# Patient Record
Sex: Male | Born: 1950 | Race: White | Hispanic: No | Smoking: Never smoker
Health system: Southern US, Community
[De-identification: ages and names within clinical notes are randomized; demographics above are authoritative.]

## PROBLEM LIST (undated history)

## (undated) DIAGNOSIS — C7800 Secondary malignant neoplasm of unspecified lung: Secondary | ICD-10-CM

## (undated) DIAGNOSIS — K4091 Unilateral inguinal hernia, without obstruction or gangrene, recurrent: Secondary | ICD-10-CM

## (undated) DIAGNOSIS — C099 Malignant neoplasm of tonsil, unspecified: Secondary | ICD-10-CM

## (undated) DIAGNOSIS — C3490 Malignant neoplasm of unspecified part of unspecified bronchus or lung: Secondary | ICD-10-CM

## (undated) DIAGNOSIS — Z8719 Personal history of other diseases of the digestive system: Secondary | ICD-10-CM

## (undated) DIAGNOSIS — J45909 Unspecified asthma, uncomplicated: Secondary | ICD-10-CM

## (undated) DIAGNOSIS — R918 Other nonspecific abnormal finding of lung field: Secondary | ICD-10-CM

## (undated) HISTORY — PX: CERVICAL SPINE SURGERY: SHX589

## (undated) HISTORY — PX: LUMBAR SPINE SURGERY: SHX701

## (undated) HISTORY — DX: Unspecified asthma, uncomplicated: J45.909

## (undated) HISTORY — DX: Secondary malignant neoplasm of unspecified lung: C78.00

## (undated) HISTORY — DX: Unilateral inguinal hernia, without obstruction or gangrene, recurrent: K40.91

---
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
## 2012-04-13 ENCOUNTER — Emergency Department: Payer: Self-pay | Admitting: Emergency Medicine

## 2012-05-08 ENCOUNTER — Ambulatory Visit: Payer: Self-pay | Admitting: Surgery

## 2012-05-08 DIAGNOSIS — Z01810 Encounter for preprocedural cardiovascular examination: Secondary | ICD-10-CM

## 2012-05-08 LAB — BASIC METABOLIC PANEL
Anion Gap: 7 (ref 7–16)
BUN: 14 mg/dL (ref 7–18)
Calcium, Total: 9.3 mg/dL (ref 8.5–10.1)
Chloride: 101 mmol/L (ref 98–107)
Co2: 30 mmol/L (ref 21–32)
Creatinine: 1.3 mg/dL (ref 0.60–1.30)
EGFR (African American): >60
EGFR (Non-African Amer.): 60 — ABNORMAL LOW
Glucose: 105 mg/dL — ABNORMAL HIGH (ref 65–99)
Osmolality: 277 (ref 275–301)
Potassium: 4.7 mmol/L (ref 3.5–5.1)
Sodium: 138 mmol/L (ref 136–145)

## 2012-05-08 LAB — CBC WITH DIFFERENTIAL/PLATELET
Basophil #: 0.1 10*3/uL (ref 0.0–0.1)
Basophil %: 0.8 %
Eosinophil #: 0.6 10*3/uL (ref 0.0–0.7)
Eosinophil %: 7.2 %
HCT: 47 % (ref 40.0–52.0)
HGB: 15.9 g/dL (ref 13.0–18.0)
Lymphocyte #: 1.5 10*3/uL (ref 1.0–3.6)
Lymphocyte %: 19 %
MCH: 31.1 pg (ref 26.0–34.0)
MCHC: 33.8 g/dL (ref 32.0–36.0)
MCV: 92 fL (ref 80–100)
Monocyte #: 0.6 x10 3/mm (ref 0.2–1.0)
Monocyte %: 7.4 %
Neutrophil #: 5.3 10*3/uL (ref 1.4–6.5)
Neutrophil %: 65.6 %
Platelet: 331 10*3/uL (ref 150–440)
RBC: 5.11 10*6/uL (ref 4.40–5.90)
RDW: 13.6 % (ref 11.5–14.5)
WBC: 8.1 10*3/uL (ref 3.8–10.6)

## 2012-05-20 ENCOUNTER — Ambulatory Visit: Payer: Self-pay | Admitting: Surgery

## 2013-01-19 ENCOUNTER — Emergency Department: Payer: Self-pay | Admitting: Emergency Medicine

## 2013-01-19 LAB — URINALYSIS, COMPLETE
Bilirubin,UR: NEGATIVE
Blood: NEGATIVE
Glucose,UR: NEGATIVE mg/dL (ref 0–75)
Hyaline Cast: 7
Ketone: NEGATIVE
Nitrite: NEGATIVE
Ph: 5 (ref 4.5–8.0)
Protein: NEGATIVE
RBC,UR: <1 /HPF (ref 0–5)
Specific Gravity: 1.02 (ref 1.003–1.030)
Squamous Epithelial: NONE SEEN
WBC UR: 11 /HPF (ref 0–5)

## 2013-01-19 LAB — CBC WITH DIFFERENTIAL/PLATELET
Basophil #: 0.1 10*3/uL (ref 0.0–0.1)
Basophil %: 0.7 %
Eosinophil #: 0.8 10*3/uL — ABNORMAL HIGH (ref 0.0–0.7)
Eosinophil %: 8.3 %
HCT: 44.6 % (ref 40.0–52.0)
HGB: 15.4 g/dL (ref 13.0–18.0)
Lymphocyte #: 1.7 10*3/uL (ref 1.0–3.6)
Lymphocyte %: 19 %
MCH: 30.8 pg (ref 26.0–34.0)
MCHC: 34.4 g/dL (ref 32.0–36.0)
MCV: 90 fL (ref 80–100)
Monocyte #: 0.7 x10 3/mm (ref 0.2–1.0)
Monocyte %: 8 %
Neutrophil #: 5.8 10*3/uL (ref 1.4–6.5)
Neutrophil %: 64 %
Platelet: 344 10*3/uL (ref 150–440)
RBC: 4.98 10*6/uL (ref 4.40–5.90)
RDW: 13.5 % (ref 11.5–14.5)
WBC: 9.1 10*3/uL (ref 3.8–10.6)

## 2013-01-19 LAB — COMPREHENSIVE METABOLIC PANEL
Albumin: 3.9 g/dL (ref 3.4–5.0)
Alkaline Phosphatase: 84 U/L (ref 50–136)
Anion Gap: 4 — ABNORMAL LOW (ref 7–16)
BUN: 15 mg/dL (ref 7–18)
Bilirubin,Total: 0.5 mg/dL (ref 0.2–1.0)
Calcium, Total: 9 mg/dL (ref 8.5–10.1)
Chloride: 104 mmol/L (ref 98–107)
Co2: 30 mmol/L (ref 21–32)
Creatinine: 1.05 mg/dL (ref 0.60–1.30)
EGFR (African American): >60
EGFR (Non-African Amer.): >60
Glucose: 96 mg/dL (ref 65–99)
Osmolality: 276 (ref 275–301)
Potassium: 4.6 mmol/L (ref 3.5–5.1)
SGOT(AST): 20 U/L (ref 15–37)
SGPT (ALT): 17 U/L (ref 12–78)
Sodium: 138 mmol/L (ref 136–145)
Total Protein: 7.8 g/dL (ref 6.4–8.2)

## 2019-09-10 ENCOUNTER — Ambulatory Visit
Admission: RE | Admit: 2019-09-10 | Discharge: 2019-09-10 | Disposition: A | Discharge status: Home / Self Care | Payer: Medicare HMO | Source: Ambulatory Visit | Attending: Family Medicine | Admitting: Family Medicine

## 2019-09-10 ENCOUNTER — Other Ambulatory Visit: Payer: Self-pay | Admitting: Family Medicine

## 2019-09-10 DIAGNOSIS — R0600 Dyspnea, unspecified: Secondary | ICD-10-CM

## 2019-09-10 DIAGNOSIS — J449 Chronic obstructive pulmonary disease, unspecified: Secondary | ICD-10-CM

## 2019-09-17 ENCOUNTER — Other Ambulatory Visit (HOSPITAL_COMMUNITY): Payer: Self-pay | Admitting: Otolaryngology

## 2019-09-17 ENCOUNTER — Other Ambulatory Visit: Payer: Self-pay | Admitting: Otolaryngology

## 2019-09-17 DIAGNOSIS — K1370 Unspecified lesions of oral mucosa: Secondary | ICD-10-CM

## 2019-09-23 ENCOUNTER — Other Ambulatory Visit: Payer: Self-pay | Admitting: *Deleted

## 2019-09-23 ENCOUNTER — Inpatient Hospital Stay: Payer: Medicare HMO | Attending: Oncology | Admitting: Oncology

## 2019-09-23 ENCOUNTER — Telehealth: Payer: Self-pay | Admitting: *Deleted

## 2019-09-23 ENCOUNTER — Encounter: Payer: Self-pay | Admitting: Oncology

## 2019-09-23 ENCOUNTER — Other Ambulatory Visit: Payer: Self-pay

## 2019-09-23 VITALS — BP 128/91 | HR 98 | Temp 97.5°F | Ht 70.0 in | Wt 220.0 lb

## 2019-09-23 DIAGNOSIS — J45909 Unspecified asthma, uncomplicated: Secondary | ICD-10-CM | POA: Insufficient documentation

## 2019-09-23 DIAGNOSIS — J449 Chronic obstructive pulmonary disease, unspecified: Secondary | ICD-10-CM | POA: Insufficient documentation

## 2019-09-23 DIAGNOSIS — Z79899 Other long term (current) drug therapy: Secondary | ICD-10-CM | POA: Diagnosis not present

## 2019-09-23 DIAGNOSIS — Z7189 Other specified counseling: Secondary | ICD-10-CM | POA: Diagnosis not present

## 2019-09-23 DIAGNOSIS — C099 Malignant neoplasm of tonsil, unspecified: Secondary | ICD-10-CM | POA: Diagnosis not present

## 2019-09-23 DIAGNOSIS — J392 Other diseases of pharynx: Secondary | ICD-10-CM

## 2019-09-23 NOTE — Progress Notes (Signed)
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 Richardson Landry  Date of visit: 09/23/19   History of presenting illness-patient is a 69 year old Caucasian male who was referred to Dr. Richardson Landry for symptoms of sore throat and difficulty swallowing which she has been experiencing over the last 1 year.He underwent a comprehensive auto laryngoscopy exam which showed a firm enlarged right tonsillar mass with necrotic area extending onto the adjacent soft palate into the root of the uvula.  Hypopharynx and larynx as well as nasopharynx was unable to be visualized due to gag reflex.  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  Patient currently lives alone and is independent of his ADLs and IADLs.  He has lost about 40 pounds of weight over the last 1 year.  He has been a lifetime non-smoker.  Reports some difficulty swallowing but reports that his pain is adequately controlled at this time.  ECOG PS- 1  Pain scale- 2   Review of systems- Review of Systems  Constitutional: Negative for chills, fever, malaise/fatigue and weight loss.  HENT: Positive for sore throat. Negative for congestion, ear discharge and nosebleeds.        Difficulty swallowing and pain during swallowing  Eyes: Negative for blurred vision.  Respiratory: Negative for cough, hemoptysis, sputum production, shortness of breath and wheezing.   Cardiovascular: Negative for chest pain, palpitations, orthopnea and claudication.  Gastrointestinal: Negative for abdominal pain, blood in stool, constipation, diarrhea, heartburn, melena, nausea and vomiting.  Genitourinary: Negative for  dysuria, flank pain, frequency, hematuria and urgency.  Musculoskeletal: Negative for back pain, joint pain and myalgias.  Skin: Negative for rash.  Neurological: Negative for dizziness, tingling, focal weakness, seizures, weakness and headaches.  Endo/Heme/Allergies: Does not bruise/bleed easily.  Psychiatric/Behavioral: Negative for depression and suicidal ideas. The patient does not have insomnia.     No Known Allergies  Patient Active Problem List   Diagnosis Date Noted  . Asthma 09/23/2019  . Goals of care, counseling/discussion 09/23/2019  . Primary squamous cell carcinoma of palatine tonsil ([place]) 09/23/2019     Past Medical History:  Diagnosis Date  . Asthma      Past Surgical History:  Procedure Laterality Date  . CERVICAL SPINE SURGERY    . LUMBAR SPINE SURGERY      Social History   Socioeconomic History  . Marital status: Married    Spouse name: Not on file  . Number of children: Not on file  . Years of education: Not on file  . Highest education level: Not on file  Occupational History  . Not on file  Tobacco Use  . Smoking status: Never Smoker  . Smokeless tobacco: Never Used  Substance and Sexual Activity  . Alcohol use: Never  . Drug use: Not on file  . Sexual activity: Not on file  Other Topics Concern  . Not on file  Social History Narrative  . Not on file   Social Determinants of Health   Financial Resource Strain:   . Difficulty of Paying Living Expenses: Not on file  Food Insecurity:   . Worried About Charity fundraiser in the Last Year: Not on file  . Ran Out of Food  in the Last Year: Not on file  Transportation Needs:   . Lack of Transportation (Medical): Not on file  . Lack of Transportation (Non-Medical): Not on file  Physical Activity:   . Days of Exercise per Week: Not on file  . Minutes of Exercise per Session: Not on file  Stress:   . Feeling of Stress : Not on file  Social Connections:   . Frequency of Communication with  Friends and Family: Not on file  . Frequency of Social Gatherings with Friends and Family: Not on file  . Attends Religious Services: Not on file  . Active Member of Clubs or Organizations: Not on file  . Attends Archivist Meetings: Not on file  . Marital Status: Not on file  Intimate Partner Violence:   . Fear of Current or Ex-Partner: Not on file  . Emotionally Abused: Not on file  . Physically Abused: Not on file  . Sexually Abused: Not on file     Family History  Problem Relation Age of Onset  . Cancer Mother 62       not sure what type  . Prostate cancer Father   . Rheum arthritis Father   . Healthy Sister   . Healthy Brother   . Healthy Sister   . Healthy Brother   . Healthy Brother   . Heart disease Brother   . Heart disease Brother      Current Outpatient Medications:  .  albuterol (VENTOLIN HFA) 108 (90 Base) MCG/ACT inhaler, Inhale 2 puffs into the lungs as needed., Disp: , Rfl:  .  SYMBICORT 160-4.5 MCG/ACT inhaler, Inhale 2 puffs into the lungs 2 (two) times daily., Disp: , Rfl:    Physical exam:  Vitals:   09/23/19 0958 09/23/19 1049  BP: (!) 128/91   Pulse: 98   Temp: (!) 97.5 F (36.4 C)   TempSrc: Tympanic   SpO2:  100%  Weight: 220 lb (99.8 kg)   Height: 5\' 10"  (1.778 m)    Physical Exam HENT:     Head: Normocephalic and atraumatic.  Eyes:     Pupils: Pupils are equal, round, and reactive to light.  Neck:     Comments: Firm palpable mass about 3 cm in size below the mandible and front of the angle of the jaw. Cardiovascular:     Rate and Rhythm: Normal rate and regular rhythm.     Heart sounds: Normal heart sounds.  Pulmonary:     Effort: Pulmonary effort is normal.     Breath sounds: Normal breath sounds.  Abdominal:     General: Bowel sounds are normal.     Palpations: Abdomen is soft.  Musculoskeletal:     Cervical back: Normal range of motion.  Skin:    General: Skin is warm and dry.  Neurological:     Mental Status:  He is alert and oriented to person, place, and time.        CMP Latest Ref Rng & Units 01/19/2013  Glucose 65 - 99 mg/dL 96  BUN 7 - 18 mg/dL 15  Creatinine 0.60 - 1.30 mg/dL 1.05  Sodium 136 - 145 mmol/L 138  Potassium 3.5 - 5.1 mmol/L 4.6  Chloride 98 - 107 mmol/L 104  CO2 21 - 32 mmol/L 30  Calcium 8.5 - 10.1 mg/dL 9.0  Total Protein 6.4 - 8.2 g/dL 7.8  Total Bilirubin 0.2 - 1.0 mg/dL 0.5  Alkaline Phos 50 - 136 Unit/L 84  AST 15 - 37  Unit/L 20  ALT 12 - 78 U/L 17   CBC Latest Ref Rng & Units 01/19/2013  WBC 3.8 - 10.6 x10 3/mm 3 9.1  Hemoglobin 13.0 - 18.0 g/dL 15.4  Hematocrit 40.0 - 52.0 % 44.6  Platelets 150 - 440 x10 3/mm 3 344    No images are attached to the encounter.  DG Chest 2 View  Result Date: 09/10/2019 CLINICAL DATA:  Dyspnea.  COPD. EXAM: CHEST - 2 VIEW COMPARISON:  05/08/2012 chest radiograph. FINDINGS: Stable cardiomediastinal silhouette with normal heart size. No pneumothorax. No pleural effusion. Faint hazy opacities at both lung bases. No pulmonary edema. IMPRESSION: Faint hazy opacities at the lung bases, cannot exclude atypical pneumonia. Suggest follow-up chest radiographs. Electronically Signed   By: Ilona Sorrel M.D.   On: 09/10/2019 15:17    Assessment and plan- Patient is a 69 y.o. 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  I did discuss with the patient that given the presence of her fairly large mass this is highly concerning for squamous cell carcinoma which was later confirmed on his pathology report that was faxed over to [place].  Patient already has a CT neck scheduled this week and I will proceed with a PET scan to complete his staging work-up.  PET scan will enable [place] to take a look at his lymph nodes better and to complete her staging.  Discussed with the patient that squamous cell carcinoma of the  oropharynx/tonsil is typically treated with concurrent chemoradiation and I will discuss this in greater detail after his CT and PET CT scan is back.  I will tentatively see him back in 1 week's time after his PET scan he will also see radiation oncology on the same day.  If there is no evidence of distant metastatic disease treatment will be given with a curative intent.  Patient verbalized understanding    Thank you for this kind referral and the opportunity to participate in the care of this patient   Visit Diagnosis 1. 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 Asthma, unspecified asthma severity, unspecified whether complicated, unspecified whether persistent   3. Goals of care, counseling/discussion     Dr. Randa Evens, MD, MPH [facility] [phone] 09/23/2019 2:59 PM

## 2019-09-23 NOTE — Progress Notes (Signed)
Patient stated that last year in the summertime he felt a lump on his throat. Patient went to see his PCP and then he was referred to be seen by ENT. Dr. Richardson Landry then did a biopsy and patient was told that he had neoplasm of uncertain behavior of pharynx.

## 2019-09-23 NOTE — Telephone Encounter (Signed)
I called pt and let him know that we contacted insurance and pet scan does require auth. We did get the pathology report from ENT this afternoon. Now that we have the pathology then the insurance auth staff will reach out again and give new dx and then once we get response then we will be able to schedule scan and then make an appt to have labs, see dr Janese Banks and have appt on same day with radiation. I will keep pt informed of these appts as they have been done. He is agreeable to plan above and we will contact later

## 2019-09-26 ENCOUNTER — Ambulatory Visit: Admission: RE | Admit: 2019-09-26 | Payer: Medicare HMO | Source: Ambulatory Visit

## 2019-09-30 ENCOUNTER — Other Ambulatory Visit: Payer: Self-pay

## 2019-09-30 ENCOUNTER — Ambulatory Visit: Payer: Medicare HMO | Admitting: Radiation Oncology

## 2019-09-30 ENCOUNTER — Ambulatory Visit
Admission: RE | Admit: 2019-09-30 | Discharge: 2019-09-30 | Disposition: A | Discharge status: Home / Self Care | Payer: Medicare HMO | Source: Ambulatory Visit | Attending: Oncology | Admitting: Oncology

## 2019-09-30 DIAGNOSIS — R933 Abnormal findings on diagnostic imaging of other parts of digestive tract: Secondary | ICD-10-CM | POA: Insufficient documentation

## 2019-09-30 DIAGNOSIS — J439 Emphysema, unspecified: Secondary | ICD-10-CM | POA: Insufficient documentation

## 2019-09-30 DIAGNOSIS — J392 Other diseases of pharynx: Secondary | ICD-10-CM | POA: Diagnosis not present

## 2019-09-30 DIAGNOSIS — R918 Other nonspecific abnormal finding of lung field: Secondary | ICD-10-CM | POA: Diagnosis not present

## 2019-09-30 DIAGNOSIS — C801 Malignant (primary) neoplasm, unspecified: Secondary | ICD-10-CM | POA: Diagnosis not present

## 2019-09-30 DIAGNOSIS — I700 Atherosclerosis of aorta: Secondary | ICD-10-CM | POA: Insufficient documentation

## 2019-09-30 DIAGNOSIS — N200 Calculus of kidney: Secondary | ICD-10-CM | POA: Insufficient documentation

## 2019-09-30 DIAGNOSIS — K4090 Unilateral inguinal hernia, without obstruction or gangrene, not specified as recurrent: Secondary | ICD-10-CM | POA: Insufficient documentation

## 2019-09-30 DIAGNOSIS — C770 Secondary and unspecified malignant neoplasm of lymph nodes of head, face and neck: Secondary | ICD-10-CM | POA: Diagnosis not present

## 2019-09-30 DIAGNOSIS — Z79899 Other long term (current) drug therapy: Secondary | ICD-10-CM | POA: Diagnosis not present

## 2019-09-30 LAB — GLUCOSE, CAPILLARY: Glucose-Capillary: 103 mg/dL — ABNORMAL HIGH (ref 70–99)

## 2019-09-30 MED ORDER — FLUDEOXYGLUCOSE F - 18 (FDG) INJECTION
11.4000 | Freq: Once | INTRAVENOUS | Status: AC | PRN
  Administered 2019-09-30: 12.21 via INTRAVENOUS

## 2019-10-02 ENCOUNTER — Telehealth: Payer: Self-pay

## 2019-10-02 ENCOUNTER — Other Ambulatory Visit: Payer: Self-pay | Admitting: *Deleted

## 2019-10-02 ENCOUNTER — Inpatient Hospital Stay: Payer: Medicare HMO | Attending: Oncology

## 2019-10-02 ENCOUNTER — Encounter: Payer: Self-pay | Admitting: Oncology

## 2019-10-02 ENCOUNTER — Telehealth: Payer: Self-pay | Admitting: Pulmonary Disease

## 2019-10-02 ENCOUNTER — Telehealth (INDEPENDENT_AMBULATORY_CARE_PROVIDER_SITE_OTHER): Payer: Self-pay

## 2019-10-02 ENCOUNTER — Inpatient Hospital Stay: Payer: Medicare HMO | Admitting: Oncology

## 2019-10-02 ENCOUNTER — Other Ambulatory Visit: Payer: Self-pay

## 2019-10-02 ENCOUNTER — Ambulatory Visit
Admission: RE | Admit: 2019-10-02 | Discharge: 2019-10-02 | Disposition: A | Discharge status: Home / Self Care | Payer: Medicare HMO | Source: Ambulatory Visit | Attending: Radiation Oncology | Admitting: Radiation Oncology

## 2019-10-02 VITALS — BP 134/94 | HR 91 | Temp 98.1°F | Ht 70.0 in | Wt 221.5 lb

## 2019-10-02 DIAGNOSIS — R918 Other nonspecific abnormal finding of lung field: Secondary | ICD-10-CM | POA: Diagnosis not present

## 2019-10-02 DIAGNOSIS — Z79899 Other long term (current) drug therapy: Secondary | ICD-10-CM | POA: Insufficient documentation

## 2019-10-02 DIAGNOSIS — R942 Abnormal results of pulmonary function studies: Secondary | ICD-10-CM

## 2019-10-02 DIAGNOSIS — G893 Neoplasm related pain (acute) (chronic): Secondary | ICD-10-CM | POA: Insufficient documentation

## 2019-10-02 DIAGNOSIS — C099 Malignant neoplasm of tonsil, unspecified: Secondary | ICD-10-CM

## 2019-10-02 DIAGNOSIS — Z5111 Encounter for antineoplastic chemotherapy: Secondary | ICD-10-CM | POA: Diagnosis not present

## 2019-10-02 DIAGNOSIS — M542 Cervicalgia: Secondary | ICD-10-CM | POA: Insufficient documentation

## 2019-10-02 DIAGNOSIS — Z7189 Other specified counseling: Secondary | ICD-10-CM | POA: Diagnosis not present

## 2019-10-02 DIAGNOSIS — R1310 Dysphagia, unspecified: Secondary | ICD-10-CM | POA: Diagnosis not present

## 2019-10-02 DIAGNOSIS — C778 Secondary and unspecified malignant neoplasm of lymph nodes of multiple regions: Secondary | ICD-10-CM | POA: Insufficient documentation

## 2019-10-02 DIAGNOSIS — J45909 Unspecified asthma, uncomplicated: Secondary | ICD-10-CM | POA: Diagnosis not present

## 2019-10-02 LAB — CBC WITH DIFFERENTIAL/PLATELET
Abs Immature Granulocytes: 0.02 10*3/uL (ref 0.00–0.07)
Basophils Absolute: 0 10*3/uL (ref 0.0–0.1)
Basophils Relative: 0 %
Eosinophils Absolute: 0.5 10*3/uL (ref 0.0–0.5)
Eosinophils Relative: 7 %
HCT: 44.2 % (ref 39.0–52.0)
Hemoglobin: 14.4 g/dL (ref 13.0–17.0)
Immature Granulocytes: 0 %
Lymphocytes Relative: 19 %
Lymphs Abs: 1.4 10*3/uL (ref 0.7–4.0)
MCH: 30.6 pg (ref 26.0–34.0)
MCHC: 32.6 g/dL (ref 30.0–36.0)
MCV: 94 fL (ref 80.0–100.0)
Monocytes Absolute: 0.7 10*3/uL (ref 0.1–1.0)
Monocytes Relative: 9 %
Neutro Abs: 4.9 10*3/uL (ref 1.7–7.7)
Neutrophils Relative %: 65 %
Platelets: 359 10*3/uL (ref 150–400)
RBC: 4.7 MIL/uL (ref 4.22–5.81)
RDW: 12.8 % (ref 11.5–15.5)
WBC: 7.5 10*3/uL (ref 4.0–10.5)
nRBC: 0 % (ref 0.0–0.2)

## 2019-10-02 LAB — COMPREHENSIVE METABOLIC PANEL
ALT: 14 U/L (ref 0–44)
AST: 18 U/L (ref 15–41)
Albumin: 4.3 g/dL (ref 3.5–5.0)
Alkaline Phosphatase: 64 U/L (ref 38–126)
Anion gap: 8 (ref 5–15)
BUN: 22 mg/dL (ref 8–23)
CO2: 24 mmol/L (ref 22–32)
Calcium: 9.6 mg/dL (ref 8.9–10.3)
Chloride: 105 mmol/L (ref 98–111)
Creatinine, Ser: 1.03 mg/dL (ref 0.61–1.24)
GFR calc Af Amer: >60 mL/min (ref >60)
GFR calc non Af Amer: >60 mL/min (ref >60)
Glucose, Bld: 105 mg/dL — ABNORMAL HIGH (ref 70–99)
Potassium: 4.5 mmol/L (ref 3.5–5.1)
Sodium: 137 mmol/L (ref 135–145)
Total Bilirubin: 0.6 mg/dL (ref 0.3–1.2)
Total Protein: 7.7 g/dL (ref 6.5–8.1)

## 2019-10-02 LAB — TSH: TSH: 2.789 u[IU]/mL (ref 0.350–4.500)

## 2019-10-02 MED ORDER — OXYCODONE HCL 5 MG PO TABS: 5.0000 mg | ORAL_TABLET | ORAL | 0 refills | Status: DC | PRN

## 2019-10-02 NOTE — Consult Note (Signed)
NEW PATIENT EVALUATION  Name: Patrick Mendoza.  MRN: [id number]  Date:   10/02/2019     DOB: 09/04/1950   This 69 y.o. male patient presents to the clinic for initial evaluation of at least stage III squamous cell carcinoma of the right tonsillar base with possible metastatic disease to chest or second primary.  REFERRING PHYSICIAN: Polanco, Shari Heritage, MD  CHIEF COMPLAINT:  Chief Complaint  Patient presents with  . Cancer    Initial consultation    DIAGNOSIS: The encounter diagnosis was Primary squamous cell carcinoma of palatine tonsil ([place]).   PREVIOUS INVESTIGATIONS:  PET/CT and CT scans reviewed Pathology report reviewed Clinical notes reviewed  HPI: Patient is a 69 year old male who presented with sore throat and some increased dysphagia over the past several months.  He was seen by Dr. Richardson Landry found to have an enlarged right tonsillar mass with necrotic area extending into the adjacent soft palate and root of the uvula.  Biopsy was positive for nonkeratinizing invasive squamous cell carcinoma p16 positive.  Patient has had some weight loss of about 40 pounds over the past year secondary to dysphagia.  Interestingly PET CT scan was performed again showing large hypermetabolic right palatine tonsil mass crossing the midline with ipsilateral cervical nodal involvement.  Patient also had bilateral pulmonary nodules including a 1 cm hypermetabolic right lung base nodule.  He also had subcarinal and left parahilar nodal metastasis all could be consistent with metastatic disease from his head and neck primary.  He has been scheduled to see pulmonology for endoscopic ultrasound biopsy of subcarinal adenopathy.  He is seen today for radiation oncology opinion.  He continues to have the dysphagia and head and neck pain.  PLANNED TREATMENT REGIMEN: Possible upfront chemotherapy with definitive radiation chemo therapy depending on staging by biopsy of his subcarinal adenopathy.  PAST MEDICAL  HISTORY:  has a past medical history of Asthma.    PAST SURGICAL HISTORY:  Past Surgical History:  Procedure Laterality Date  . CERVICAL SPINE SURGERY    . LUMBAR SPINE SURGERY      FAMILY HISTORY: family history includes Cancer (age of onset: 64) in his mother; Healthy in his brother, brother, brother, sister, and sister; Heart disease in his brother and brother; Prostate cancer in his father; Rheum arthritis in his father.  SOCIAL HISTORY:  reports that he has never smoked. He has never used smokeless tobacco. He reports that he does not drink alcohol.  ALLERGIES: Patient has no known allergies.  MEDICATIONS:  Current Outpatient Medications  Medication Sig Dispense Refill  . acetaminophen (TYLENOL) 500 MG tablet Take 1,000 mg by mouth every 8 (eight) hours as needed.    Marland Kitchen albuterol (VENTOLIN HFA) 108 (90 Base) MCG/ACT inhaler Inhale 2 puffs into the lungs as needed.    . SYMBICORT 160-4.5 MCG/ACT inhaler Inhale 2 puffs into the lungs 2 (two) times daily.     No current facility-administered medications for this encounter.    ECOG PERFORMANCE STATUS:  1 - Symptomatic but completely ambulatory  REVIEW OF SYSTEMS: Except for the dysphagia and head and neck pain Patient denies any weight loss, fatigue, weakness, fever, chills or night sweats. Patient denies any loss of vision, blurred vision. Patient denies any ringing  of the ears or hearing loss. No irregular heartbeat. Patient denies heart murmur or history of fainting. Patient denies any chest pain or pain radiating to her upper extremities. Patient denies any shortness of breath, difficulty breathing at night, cough or hemoptysis.  Patient denies any swelling in the lower legs. Patient denies any nausea vomiting, vomiting of blood, or coffee ground material in the vomitus. Patient denies any stomach pain. Patient states has had normal bowel movements no significant constipation or diarrhea. Patient denies any dysuria, hematuria or  significant nocturia. Patient denies any problems walking, swelling in the joints or loss of balance. Patient denies any skin changes, loss of hair or loss of weight. Patient denies any excessive worrying or anxiety or significant depression. Patient denies any problems with insomnia. Patient denies excessive thirst, polyuria, polydipsia. Patient denies any swollen glands, patient denies easy bruising or easy bleeding. Patient denies any recent infections, allergies or URI. Patient "s visual fields have not changed significantly in recent time.   PHYSICAL EXAM: There were no vitals taken for this visit. There is subtle bulky adenopathy in the right cervical chain.  Well-developed well-nourished patient in NAD. HEENT reveals PERLA, EOMI, discs not visualized.  Oral cavity is clear. No oral mucosal lesions are identified. Neck is clear without evidence of cervical or supraclavicular adenopathy. Lungs are clear to A&P. Cardiac examination is essentially unremarkable with regular rate and rhythm without murmur rub or thrill. Abdomen is benign with no organomegaly or masses noted. Motor sensory and DTR levels are equal and symmetric in the upper and lower extremities. Cranial nerves II through XII are grossly intact. Proprioception is intact. No peripheral adenopathy or edema is identified. No motor or sensory levels are noted. Crude visual fields are within normal range.  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  Patient would benefit  from initial chemotherapy to try to downstage some of his extensive hypermetabolic disease in the head and neck region.  I would then favor IMRT radiation therapy to the head and neck along with concurrent chemotherapy.  Would plan on delivering 7000 cGy to areas of hypermetabolic disease and bilateral neck irradiation based on the lesion crossing the midline and extensive size and nature of his disease.  We will also deal with his chest depending on whether we decide this is to disease processes or 1 stage IV head and neck cancer.  Case was personally discussed with medical oncology.  At this time will see the patient back as per Dr. Janese Banks.  I would like to take this opportunity to thank you for allowing me to participate in the care of your patient.Noreene Filbert, MD

## 2019-10-02 NOTE — Telephone Encounter (Signed)
Spoke with the patient and he is now scheduled with Dr. Delana Meyer for port placement on 10/08/19 with a 8:30 am arrival time to the [facility]. Patient will do covid testing on 10/06/19 between 12:30-2:30 pm at the [facility]. Pre-procedure instructions were discussed and will be mailed to the patient.

## 2019-10-02 NOTE — Telephone Encounter (Signed)
Lm to schedule pt for OV with Dr. Patsey Berthold on 10/13/2019 to discuss bronch. Okay per LG for 41min slot.

## 2019-10-02 NOTE — Progress Notes (Signed)
Patient stated that he has mouth sores and he is not able to eat and that it's getting worse.

## 2019-10-02 NOTE — Telephone Encounter (Signed)
10/06/2019 at 1:30 has been opened, as pt needs sooner appt then 10/13/2019.

## 2019-10-02 NOTE — Telephone Encounter (Signed)
Nutrition  Referral from Dr. Janese Banks.    RD was unable to see patient while in clinic this am.    Called patient this pm but no answer.  Left message with call back number.   Marque Rademaker B. [name], [place], [place] Registered Dietitian [phone] (pager)

## 2019-10-03 NOTE — Telephone Encounter (Signed)
Pt has been scheduled for OV on 10/06/2019 with Dr. Patsey Berthold. Pt has been made aware of appt date/time and voiced his understanding.  Nothing further is needed.

## 2019-10-03 NOTE — Patient Instructions (Signed)
Pembrolizumab injection What is this medicine? PEMBROLIZUMAB (pem broe liz ue mab) is a monoclonal antibody. It is used to treat certain types of cancer. This medicine may be used for other purposes; ask your health care provider or pharmacist if you have questions. COMMON BRAND NAME(S): Keytruda What should I tell my health care provider before I take this medicine? They need to know if you have any of these conditions:  diabetes  immune system problems  inflammatory bowel disease  liver disease  lung or breathing disease  lupus  received or scheduled to receive an organ transplant or a stem-cell transplant that uses donor stem cells  an unusual or allergic reaction to pembrolizumab, other medicines, foods, dyes, or preservatives  pregnant or trying to get pregnant  breast-feeding How should I use this medicine? This medicine is for infusion into a vein. It is given by a health care professional in a hospital or clinic setting. A special MedGuide will be given to you before each treatment. Be sure to read this information carefully each time. Talk to your pediatrician regarding the use of this medicine in children. While this drug may be prescribed for children as young as 6 months for selected conditions, precautions do apply. Overdosage: If you think you have taken too much of this medicine contact a poison control center or emergency room at once. NOTE: This medicine is only for you. Do not share this medicine with others. What if I miss a dose? It is important not to miss your dose. Call your doctor or health care professional if you are unable to keep an appointment. What may interact with this medicine? Interactions have not been studied. Give your health care provider a list of all the medicines, herbs, non-prescription drugs, or dietary supplements you use. Also tell them if you smoke, drink alcohol, or use illegal drugs. Some items may interact with your medicine. This  list may not describe all possible interactions. Give your health care provider a list of all the medicines, herbs, non-prescription drugs, or dietary supplements you use. Also tell them if you smoke, drink alcohol, or use illegal drugs. Some items may interact with your medicine. What should I watch for while using this medicine? Your condition will be monitored carefully while you are receiving this medicine. You may need blood work done while you are taking this medicine. Do not become pregnant while taking this medicine or for 4 months after stopping it. Women should inform their doctor if they wish to become pregnant or think they might be pregnant. There is a potential for serious side effects to an unborn child. Talk to your health care professional or pharmacist for more information. Do not breast-feed an infant while taking this medicine or for 4 months after the last dose. What side effects may I notice from receiving this medicine? Side effects that you should report to your doctor or health care professional as soon as possible:  allergic reactions like skin rash, itching or hives, swelling of the face, lips, or tongue  bloody or black, tarry  breathing problems  changes in vision  chest pain  chills  confusion  constipation  cough  diarrhea  dizziness or feeling faint or lightheaded  fast or irregular heartbeat  fever  flushing  joint pain  low blood counts - this medicine may decrease the number of white blood cells, red blood cells and platelets. You may be at increased risk for infections and bleeding.  muscle pain  muscle  weakness  pain, tingling, numbness in the hands or feet  persistent headache  redness, blistering, peeling or loosening of the skin, including inside the mouth  signs and symptoms of high blood sugar such as dizziness; dry mouth; dry skin; fruity breath; nausea; stomach pain; increased hunger or thirst; increased urination  signs  and symptoms of kidney injury like trouble passing urine or change in the amount of urine  signs and symptoms of liver injury like dark urine, light-colored stools, loss of appetite, nausea, right upper belly pain, yellowing of the eyes or skin  sweating  swollen lymph nodes  weight loss Side effects that usually do not require medical attention (report to your doctor or health care professional if they continue or are bothersome):  decreased appetite  hair loss  muscle pain  tiredness This list may not describe all possible side effects. Call your doctor for medical advice about side effects. You may report side effects to FDA at 1-800-FDA-1088. Where should I keep my medicine? This drug is given in a hospital or clinic and will not be stored at home. NOTE: This sheet is a summary. It may not cover all possible information. If you have questions about this medicine, talk to your doctor, pharmacist, or health care provider.  2020 Elsevier/Gold Standard (2019-06-20 18:07:58) Paclitaxel injection What is this medicine? PACLITAXEL (PAK li TAX el) is a chemotherapy drug. It targets fast dividing cells, like cancer cells, and causes these cells to die. This medicine is used to treat ovarian cancer, breast cancer, lung cancer, Kaposi's sarcoma, and other cancers. This medicine may be used for other purposes; ask your health care provider or pharmacist if you have questions. COMMON BRAND NAME(S): Onxol, Taxol What should I tell my health care provider before I take this medicine? They need to know if you have any of these conditions:  history of irregular heartbeat  liver disease  low blood counts, like low white cell, platelet, or red cell counts  lung or breathing disease, like asthma  tingling of the fingers or toes, or other nerve disorder  an unusual or allergic reaction to paclitaxel, alcohol, polyoxyethylated castor oil, other chemotherapy, other medicines, foods, dyes, or  preservatives  pregnant or trying to get pregnant  breast-feeding How should I use this medicine? This drug is given as an infusion into a vein. It is administered in a hospital or clinic by a specially trained health care professional. Talk to your pediatrician regarding the use of this medicine in children. Special care may be needed. Overdosage: If you think you have taken too much of this medicine contact a poison control center or emergency room at once. NOTE: This medicine is only for you. Do not share this medicine with others. What if I miss a dose? It is important not to miss your dose. Call your doctor or health care professional if you are unable to keep an appointment. What may interact with this medicine? Do not take this medicine with any of the following medications:  disulfiram  metronidazole This medicine may also interact with the following medications:  antiviral medicines for hepatitis, HIV or AIDS  certain antibiotics like erythromycin and clarithromycin  certain medicines for fungal infections like ketoconazole and itraconazole  certain medicines for seizures like carbamazepine, phenobarbital, phenytoin  gemfibrozil  nefazodone  rifampin  St. John's wort This list may not describe all possible interactions. Give your health care provider a list of all the medicines, herbs, non-prescription drugs, or dietary supplements you  use. Also tell them if you smoke, drink alcohol, or use illegal drugs. Some items may interact with your medicine. What should I watch for while using this medicine? Your condition will be monitored carefully while you are receiving this medicine. You will need important blood work done while you are taking this medicine. This medicine can cause serious allergic reactions. To reduce your risk you will need to take other medicine(s) before treatment with this medicine. If you experience allergic reactions like skin rash, itching or hives,  swelling of the face, lips, or tongue, tell your doctor or health care professional right away. In some cases, you may be given additional medicines to help with side effects. Follow all directions for their use. This drug may make you feel generally unwell. This is not uncommon, as chemotherapy can affect healthy cells as well as cancer cells. Report any side effects. Continue your course of treatment even though you feel ill unless your doctor tells you to stop. Call your doctor or health care professional for advice if you get a fever, chills or sore throat, or other symptoms of a cold or flu. Do not treat yourself. This drug decreases your body's ability to fight infections. Try to avoid being around people who are sick. This medicine may increase your risk to bruise or bleed. Call your doctor or health care professional if you notice any unusual bleeding. Be careful brushing and flossing your teeth or using a toothpick because you may get an infection or bleed more easily. If you have any dental work done, tell your dentist you are receiving this medicine. Avoid taking products that contain aspirin, acetaminophen, ibuprofen, naproxen, or ketoprofen unless instructed by your doctor. These medicines may hide a fever. Do not become pregnant while taking this medicine. Women should inform their doctor if they wish to become pregnant or think they might be pregnant. There is a potential for serious side effects to an unborn child. Talk to your health care professional or pharmacist for more information. Do not breast-feed an infant while taking this medicine. Men are advised not to father a child while receiving this medicine. This product may contain alcohol. Ask your pharmacist or healthcare provider if this medicine contains alcohol. Be sure to tell all healthcare providers you are taking this medicine. Certain medicines, like metronidazole and disulfiram, can cause an unpleasant reaction when taken with  alcohol. The reaction includes flushing, headache, nausea, vomiting, sweating, and increased thirst. The reaction can last from 30 minutes to several hours. What side effects may I notice from receiving this medicine? Side effects that you should report to your doctor or health care professional as soon as possible:  allergic reactions like skin rash, itching or hives, swelling of the face, lips, or tongue  breathing problems  changes in vision  fast, irregular heartbeat  high or low blood pressure  mouth sores  pain, tingling, numbness in the hands or feet  signs of decreased platelets or bleeding - bruising, pinpoint red spots on the skin, black, tarry stools, blood in the urine  signs of decreased red blood cells - unusually weak or tired, feeling faint or lightheaded, falls  signs of infection - fever or chills, cough, sore throat, pain or difficulty passing urine  signs and symptoms of liver injury like dark yellow or brown urine; general ill feeling or flu-like symptoms; light-colored stools; loss of appetite; nausea; right upper belly pain; unusually weak or tired; yellowing of the eyes or skin  swelling  of the ankles, feet, hands  unusually slow heartbeat Side effects that usually do not require medical attention (report to your doctor or health care professional if they continue or are bothersome):  diarrhea  hair loss  loss of appetite  muscle or joint pain  nausea, vomiting  pain, redness, or irritation at site where injected  tiredness This list may not describe all possible side effects. Call your doctor for medical advice about side effects. You may report side effects to FDA at 1-800-FDA-1088. Where should I keep my medicine? This drug is given in a hospital or clinic and will not be stored at home. NOTE: This sheet is a summary. It may not cover all possible information. If you have questions about this medicine, talk to your doctor, pharmacist, or  health care provider.  2020 Elsevier/Gold Standard (2017-04-17 13:14:55) Carboplatin injection What is this medicine? CARBOPLATIN (KAR boe pla tin) is a chemotherapy drug. It targets fast dividing cells, like cancer cells, and causes these cells to die. This medicine is used to treat ovarian cancer and many other cancers. This medicine may be used for other purposes; ask your health care provider or pharmacist if you have questions. COMMON BRAND NAME(S): Paraplatin What should I tell my health care provider before I take this medicine? They need to know if you have any of these conditions:  blood disorders  hearing problems  kidney disease  recent or ongoing radiation therapy  an unusual or allergic reaction to carboplatin, cisplatin, other chemotherapy, other medicines, foods, dyes, or preservatives  pregnant or trying to get pregnant  breast-feeding How should I use this medicine? This drug is usually given as an infusion into a vein. It is administered in a hospital or clinic by a specially trained health care professional. Talk to your pediatrician regarding the use of this medicine in children. Special care may be needed. Overdosage: If you think you have taken too much of this medicine contact a poison control center or emergency room at once. NOTE: This medicine is only for you. Do not share this medicine with others. What if I miss a dose? It is important not to miss a dose. Call your doctor or health care professional if you are unable to keep an appointment. What may interact with this medicine?  medicines for seizures  medicines to increase blood counts like filgrastim, pegfilgrastim, sargramostim  some antibiotics like amikacin, gentamicin, neomycin, streptomycin, tobramycin  vaccines Talk to your doctor or health care professional before taking any of these medicines:  acetaminophen  aspirin  ibuprofen  ketoprofen  naproxen This list may not describe  all possible interactions. Give your health care provider a list of all the medicines, herbs, non-prescription drugs, or dietary supplements you use. Also tell them if you smoke, drink alcohol, or use illegal drugs. Some items may interact with your medicine. What should I watch for while using this medicine? Your condition will be monitored carefully while you are receiving this medicine. You will need important blood work done while you are taking this medicine. This drug may make you feel generally unwell. This is not uncommon, as chemotherapy can affect healthy cells as well as cancer cells. Report any side effects. Continue your course of treatment even though you feel ill unless your doctor tells you to stop. In some cases, you may be given additional medicines to help with side effects. Follow all directions for their use. Call your doctor or health care professional for advice if you get  a fever, chills or sore throat, or other symptoms of a cold or flu. Do not treat yourself. This drug decreases your body's ability to fight infections. Try to avoid being around people who are sick. This medicine may increase your risk to bruise or bleed. Call your doctor or health care professional if you notice any unusual bleeding. Be careful brushing and flossing your teeth or using a toothpick because you may get an infection or bleed more easily. If you have any dental work done, tell your dentist you are receiving this medicine. Avoid taking products that contain aspirin, acetaminophen, ibuprofen, naproxen, or ketoprofen unless instructed by your doctor. These medicines may hide a fever. Do not become pregnant while taking this medicine. Women should inform their doctor if they wish to become pregnant or think they might be pregnant. There is a potential for serious side effects to an unborn child. Talk to your health care professional or pharmacist for more information. Do not breast-feed an infant while  taking this medicine. What side effects may I notice from receiving this medicine? Side effects that you should report to your doctor or health care professional as soon as possible:  allergic reactions like skin rash, itching or hives, swelling of the face, lips, or tongue  signs of infection - fever or chills, cough, sore throat, pain or difficulty passing urine  signs of decreased platelets or bleeding - bruising, pinpoint red spots on the skin, black, tarry stools, nosebleeds  signs of decreased red blood cells - unusually weak or tired, fainting spells, lightheadedness  breathing problems  changes in hearing  changes in vision  chest pain  high blood pressure  low blood counts - This drug may decrease the number of white blood cells, red blood cells and platelets. You may be at increased risk for infections and bleeding.  nausea and vomiting  pain, swelling, redness or irritation at the injection site  pain, tingling, numbness in the hands or feet  problems with balance, talking, walking  trouble passing urine or change in the amount of urine Side effects that usually do not require medical attention (report to your doctor or health care professional if they continue or are bothersome):  hair loss  loss of appetite  metallic taste in the mouth or changes in taste This list may not describe all possible side effects. Call your doctor for medical advice about side effects. You may report side effects to FDA at 1-800-FDA-1088. Where should I keep my medicine? This drug is given in a hospital or clinic and will not be stored at home. NOTE: This sheet is a summary. It may not cover all possible information. If you have questions about this medicine, talk to your doctor, pharmacist, or health care provider.  2020 Elsevier/Gold Standard (2007-11-19 14:38:05)

## 2019-10-06 ENCOUNTER — Inpatient Hospital Stay: Payer: Medicare HMO

## 2019-10-06 ENCOUNTER — Telehealth: Payer: Self-pay

## 2019-10-06 ENCOUNTER — Other Ambulatory Visit
Admission: RE | Admit: 2019-10-06 | Discharge: 2019-10-06 | Disposition: A | Discharge status: Home / Self Care | Payer: Medicare HMO | Source: Ambulatory Visit | Attending: Pulmonary Disease | Admitting: Pulmonary Disease

## 2019-10-06 ENCOUNTER — Telehealth: Payer: Self-pay | Admitting: Oncology

## 2019-10-06 ENCOUNTER — Other Ambulatory Visit: Payer: Self-pay

## 2019-10-06 ENCOUNTER — Ambulatory Visit: Payer: Medicare HMO | Admitting: Pulmonary Disease

## 2019-10-06 ENCOUNTER — Encounter: Payer: Self-pay | Admitting: Oncology

## 2019-10-06 ENCOUNTER — Encounter: Payer: Self-pay | Admitting: Pulmonary Disease

## 2019-10-06 VITALS — BP 134/78 | HR 96 | Temp 98.1°F | Ht 70.0 in | Wt 225.0 lb

## 2019-10-06 DIAGNOSIS — Z01812 Encounter for preprocedural laboratory examination: Secondary | ICD-10-CM | POA: Insufficient documentation

## 2019-10-06 DIAGNOSIS — R590 Localized enlarged lymph nodes: Secondary | ICD-10-CM | POA: Diagnosis not present

## 2019-10-06 DIAGNOSIS — R918 Other nonspecific abnormal finding of lung field: Secondary | ICD-10-CM

## 2019-10-06 DIAGNOSIS — Z20822 Contact with and (suspected) exposure to covid-19: Secondary | ICD-10-CM | POA: Diagnosis not present

## 2019-10-06 DIAGNOSIS — C099 Malignant neoplasm of tonsil, unspecified: Secondary | ICD-10-CM

## 2019-10-06 MED ORDER — DEXAMETHASONE 4 MG PO TABS: ORAL_TABLET | ORAL | 1 refills | Status: DC

## 2019-10-06 MED ORDER — LORAZEPAM 0.5 MG PO TABS: 0.5000 mg | ORAL_TABLET | Freq: Four times a day (QID) | ORAL | 0 refills | Status: DC | PRN

## 2019-10-06 MED ORDER — PROCHLORPERAZINE MALEATE 10 MG PO TABS: 10.0000 mg | ORAL_TABLET | Freq: Four times a day (QID) | ORAL | 1 refills | Status: DC | PRN

## 2019-10-06 MED ORDER — LIDOCAINE-PRILOCAINE 2.5-2.5 % EX CREA: TOPICAL_CREAM | CUTANEOUS | 3 refills | Status: DC

## 2019-10-06 MED ORDER — ONDANSETRON HCL 8 MG PO TABS: 8.0000 mg | ORAL_TABLET | Freq: Two times a day (BID) | ORAL | 1 refills | Status: DC | PRN

## 2019-10-06 NOTE — Progress Notes (Signed)
Subjective:    Patient ID: Patrick Bering., male    DOB: 1951/04/03, 69 y.o.   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  He is kindly referred by Dr. Randa Evens.  The patient states that since approximately the summer 2020 he has noted pain on his right face.  He has also noted firm and tender right cervical area.  He has not had any fevers, chills or sweats.  He was evaluated by ENT, Dr. 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  Patient underwent PET CT on 09/30/2019 which showed a large hypermetabolic right palatine tonsil mass which crossed midline, there was ipsilateral cervical nodal metastasis and bilateral pulmonary nodules the dominant of which is one-point centimeter in the right lung base.  The lung nodules have smooth borders.  Was also subcarinal and possible left hilar adenopathy.  He will need biopsy of these areas to rule out a synchronous primary lung neoplasm however, highly suspect that these are due to metastatic disease.  The patient has not had any fevers, chills or sweats.  No dyspnea over usual.  He does carry a diagnosis of asthma but is well controlled on Symbicort 160/4.5, 2 inhalations twice a day and as needed albuterol.  Does not endorse having any cough or sputum production.  No chest pain.  Does complain of right-sided facial/cervical pain.  This is related to his malignancy as above.  No nausea vomiting, abdominal pain anorexia or weight loss.  Patient's past medical history, surgical history and family history have been reviewed.  They are as noted.  Social history: Is a lifelong never smoker.  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  Patient does not have any military history.  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  Review of Systems A 10 point review of systems was performed and it is as noted above otherwise negative.    Objective:   Physical Exam GENERAL: Well-developed, overweight gentleman in no acute respiratory distress.  Fully ambulatory.  Voice is clear. HEAD: Normocephalic, atraumatic.  EYES: Pupils equal, round, reactive to light.  No scleral icterus.  MOUTH: Nose/mouth/throat not examined due to masking requirements for COVID 19. NECK: Supple. No thyromegaly. No nodules. No JVD.  Palpable cervical adenopathy, firm and tender to touch. PULMONARY: Lungs clear to auscultation bilaterally.  Symmetrical air entry. CARDIOVASCULAR: S1 and S2. Regular rate and rhythm.  No murmurs noted. GASTROINTESTINAL: Nondistended abdomen. MUSCULOSKELETAL: No joint swelling, no clubbing, no edema.  NEUROLOGIC: No focal deficits noted.  Speech is fluent. SKIN: Intact,warm,dry, no rashes noted on limited exam.  PSYCH: Mood and behavior is appropriate.   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  It is likely metastatic disease however synchronous tumor cannot be excluded  Patient will need bronchoscopy with endobronchial ultrasound and attempt at navigational bronchoscopy to dominant right lower lobe nodule (1 cm)  Benefits, limitations and potential complications of the procedure  were discussed with the patient/family  including, but not limited to bleeding, hemoptysis, respiratory failure requiring intubation and/or prolongued mechanical ventilation, infection, pneumothorax (collapse of lung) requiring chest tube placement, stroke from air embolism or even death  Patient agrees to  proceed with bronchoscopy with endobronchial ultrasound and navigational bronchoscopy  Patient understands the procedure will be under general anesthesia  Scheduled for 12 February 7:30 AM   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 Derrill Kay, MD [facility]    *This note was dictated using voice recognition software/Dragon.  Despite best efforts to proofread, errors can occur which can change the meaning.  Any change was purely unintentional.

## 2019-10-06 NOTE — Telephone Encounter (Signed)
Writer phoned patient and scheduled telephone visit with patient for chemo care clinic. Patient stated that he did not have video capability and telephone visit was made for 10-09-19.

## 2019-10-06 NOTE — Telephone Encounter (Signed)
Pt is scheduled for Bronch with EBUS, navigation, and cellvizio on 10/10/2019 at 730am. Dx: mediastinal adenopathy and right lower lobe nodule. CPT codes: [id number], [id number], [id number], [id number]. Rhonda please see bronch information.

## 2019-10-06 NOTE — Progress Notes (Signed)
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 Richardson Landry for symptoms of sore throat and difficulty swallowing which she has been experiencing over the last 1 year.He underwent a comprehensive auto laryngoscopy exam which showed a firm enlarged right tonsillar mass with necrotic area extending onto the adjacent soft palate into the root of the uvula.  Hypopharynx and larynx as well as nasopharynx was unable to be visualized due to gag reflex.  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  PET CT scan showed large hypermetabolic right palatine tonsil mass which crosses the midline.  Ipsilateral cervical nodal metastases.  Bilateral pulmonary nodules including a dominant 1 cm hypermetabolic right lung base nodule.  Multiplicity favor pulmonary metastases.  Subcarinal and equivocal left hilar nodal metastases.  Hypermetabolism involving the ascending colon.  Question mild diverticulitis versus underlying colonic mass or polyp cannot be excluded   Interval history-patient does report pain during swallowing which makes it hard for him to swallow.  Denies other complaints at this time  ECOG PS- 1 Pain scale- 4 Opioid associated constipation- no  Review of systems- Review of Systems  Constitutional: Negative for chills, fever, malaise/fatigue and weight loss.  HENT: Negative for congestion, ear discharge and  nosebleeds.   Eyes: Negative for blurred vision.  Respiratory: Negative for cough, hemoptysis, sputum production, shortness of breath and wheezing.   Cardiovascular: Negative for chest pain, palpitations, orthopnea and claudication.  Gastrointestinal: Negative for abdominal pain, blood in stool, constipation, diarrhea, heartburn, melena, nausea and vomiting.       Pain during swallowing  Genitourinary: Negative for dysuria, flank pain, frequency, hematuria and urgency.  Musculoskeletal: Negative for back pain, joint pain and myalgias.  Skin: Negative for rash.  Neurological: Negative for dizziness, tingling, focal weakness, seizures, weakness and headaches.  Endo/Heme/Allergies: Does not bruise/bleed easily.  Psychiatric/Behavioral: Negative for depression and suicidal ideas. The patient does not have insomnia.       No Known Allergies   Past Medical History:  Diagnosis Date  . Asthma      Past Surgical History:  Procedure Laterality Date  . CERVICAL SPINE SURGERY    . LUMBAR SPINE SURGERY      Social History   Socioeconomic History  . Marital status: Widowed    Spouse name: Not on file  . Number of children: Not on file  . Years of education: Not on file  . Highest education level: Not on file  Occupational History  . Not on file  Tobacco Use  . Smoking status: Never Smoker  . Smokeless tobacco: Never Used  Substance and Sexual Activity  . Alcohol use: Never  . Drug use: Not on file  . Sexual activity: Not on file  Other Topics Concern  . Not on file  Social History Narrative  . Not on file   Social Determinants of Health   Financial Resource Strain:   . Difficulty of Paying Living Expenses: Not on file  Food Insecurity:   .  Worried About Charity fundraiser in the Last Year: Not on file  . Ran Out of Food in the Last Year: Not on file  Transportation Needs:   . Lack of Transportation (Medical): Not on file  . Lack of Transportation (Non-Medical): Not on  file  Physical Activity:   . Days of Exercise per Week: Not on file  . Minutes of Exercise per Session: Not on file  Stress:   . Feeling of Stress : Not on file  Social Connections:   . Frequency of Communication with Friends and Family: Not on file  . Frequency of Social Gatherings with Friends and Family: Not on file  . Attends Religious Services: Not on file  . Active Member of Clubs or Organizations: Not on file  . Attends Archivist Meetings: Not on file  . Marital Status: Not on file  Intimate Partner Violence:   . Fear of Current or Ex-Partner: Not on file  . Emotionally Abused: Not on file  . Physically Abused: Not on file  . Sexually Abused: Not on file    Family History  Problem Relation Age of Onset  . Cancer Mother 55       not sure what type  . Prostate cancer Father   . Rheum arthritis Father   . Healthy Sister   . Healthy Brother   . Healthy Sister   . Healthy Brother   . Healthy Brother   . Heart disease Brother   . Heart disease Brother      Current Outpatient Medications:  .  acetaminophen (TYLENOL) 500 MG tablet, Take 500-1,000 mg by mouth every 8 (eight) hours as needed (for pain.). , Disp: , Rfl:  .  albuterol (VENTOLIN HFA) 108 (90 Base) MCG/ACT inhaler, Inhale 2 puffs into the lungs every 6 (six) hours as needed (wheezing/shortness of breath). , Disp: , Rfl:  .  SYMBICORT 160-4.5 MCG/ACT inhaler, Inhale 2 puffs into the lungs 2 (two) times daily., Disp: , Rfl:  .  oxyCODONE (OXY IR/ROXICODONE) 5 MG immediate release tablet, Take 1 tablet (5 mg total) by mouth every 4 (four) hours as needed for severe pain., Disp: 60 tablet, Rfl: 0  Physical exam:  Vitals:   10/02/19 1021  BP: (!) 134/94  Pulse: 91  Temp: 98.1 F (36.7 C)  TempSrc: Tympanic  Weight: 221 lb 8 oz (100.5 kg)  Height: 5\' 10"  (1.778 m)   Physical Exam HENT:     Head: Normocephalic and atraumatic.  Eyes:     Pupils: Pupils are equal, round, and reactive to light.   Neck:     Comments: Palpable right cervical adenopathy Cardiovascular:     Rate and Rhythm: Normal rate and regular rhythm.     Heart sounds: Normal heart sounds.  Pulmonary:     Effort: Pulmonary effort is normal.     Breath sounds: Normal breath sounds.  Abdominal:     General: Bowel sounds are normal.     Palpations: Abdomen is soft.  Musculoskeletal:     Cervical back: Normal range of motion.  Skin:    General: Skin is warm and dry.  Neurological:     Mental Status: He is alert and oriented to person, place, and time.      CMP Latest Ref Rng & Units 10/02/2019  Glucose 70 - 99 mg/dL 105(H)  BUN 8 - 23 mg/dL 22  Creatinine 0.61 - 1.24 mg/dL 1.03  Sodium 135 - 145 mmol/L 137  Potassium 3.5 -  5.1 mmol/L 4.5  Chloride 98 - 111 mmol/L 105  CO2 22 - 32 mmol/L 24  Calcium 8.9 - 10.3 mg/dL 9.6  Total Protein 6.5 - 8.1 g/dL 7.7  Total Bilirubin 0.3 - 1.2 mg/dL 0.6  Alkaline Phos 38 - 126 U/L 64  AST 15 - 41 U/L 18  ALT 0 - 44 U/L 14   CBC Latest Ref Rng & Units 10/02/2019  WBC 4.0 - 10.5 K/uL 7.5  Hemoglobin 13.0 - 17.0 g/dL 14.4  Hematocrit 39.0 - 52.0 % 44.2  Platelets 150 - 400 K/uL 359    No images are attached to the encounter.  DG Chest 2 View  Result Date: 09/10/2019 CLINICAL DATA:  Dyspnea.  COPD. EXAM: CHEST - 2 VIEW COMPARISON:  05/08/2012 chest radiograph. FINDINGS: Stable cardiomediastinal silhouette with normal heart size. No pneumothorax. No pleural effusion. Faint hazy opacities at both lung bases. No pulmonary edema. IMPRESSION: Faint hazy opacities at the lung bases, cannot exclude atypical pneumonia. Suggest follow-up chest radiographs. Electronically Signed   By: Ilona Sorrel M.D.   On: 09/10/2019 15:17   NM PET Image Initial (PI) Skull Base To Thigh  Result Date: 09/30/2019 CLINICAL DATA:  Initial treatment strategy for pharyngeal mass. Primary squamous cell carcinoma of palatine tonsil. EXAM: NUCLEAR MEDICINE PET SKULL BASE TO THIGH TECHNIQUE: 12.2 mCi  F-18 FDG was injected intravenously. Full-ring PET imaging was performed from the skull base to thigh after the radiotracer. CT data was obtained and used for attenuation correction and anatomic localization. Fasting blood glucose: 103 mg/dl COMPARISON:  None. FINDINGS: Mediastinal blood pool activity: SUV max 2.4 Liver activity: SUV max NA NECK: Right-sided palatine tonsil primary extends across the midline, impressing upon the airway. Measures 3.1 x 3.3 cm and a S.U.V. max of 14.0 on 25/3. Bulky right-sided level 2 nodes, including at 2.9 x 2.6 cm and a S.U.V. max of 11.8 on 34/3. Right-sided level 3 node measures 1.7 x 2.0 cm and a S.U.V. max of 3.5 on 44/3. Incidental CT findings: No left-sided cervical adenopathy. Right greater than left ethmoid air cell mucosal thickening with complete opacification of the imaged right frontal sinus. Clear mastoid air cells. CHEST: A right lung base (just along the right major fissure) nodule measures 1.0 cm and a S.U.V. max of 3.4 on 90/3. Subcarinal adenopathy at 2.9 x 1.8 cm and a S.U.V. max of 7.2 on 86/3. Hypermetabolism, possibly corresponding to a small node, about the medial aspect of the left hilum. This measures a S.U.V. max of 4.5 on 79/3. Incidental CT findings: Aortic atherosclerosis. Mild centrilobular emphysema. Right lower lobe 5 mm pulmonary nodule on 85/3. 7 mm left lower lobe pulmonary nodule on 105/3. Superior segment left lower lobe 7 mm nodule on 81/3. ABDOMEN/PELVIS: Hypermetabolism involves the ascending colon, just above the ileocecal junction. This corresponds to suggestion of wall thickening and possible pericolonic edema, including on 168/3. Measures a S.U.V. max of 9.3. Incidental CT findings: Normal adrenal glands. Left larger than right renal collecting system calculi including at up to 7 mm. Lower pole 2.9 cm right renal low-density lesion is likely a cyst. Left larger than right fat containing inguinal hernias. The left-sided inguinal hernia  contains a portion of the urinary bladder, including on 230/3. SKELETON: No abnormal marrow activity. Incidental CT findings: none IMPRESSION: 1. Large hypermetabolic right palatine tonsil mass which crosses the midline. Ipsilateral cervical nodal metastasis. 2. Bilateral pulmonary nodules, including a dominant 1.0 cm hypermetabolic right lung base nodule. Given multiplicity, favor pulmonary  metastasis. 3. Subcarinal and equivocal left perihilar nodal metastasis, presumably from head neck primary. Less likely, dominant right lower lobe pulmonary nodule could represent a synchronous primary lung neoplasm. 4. Hypermetabolism with subtle soft tissue thickening involving the ascending colon. Question mild diverticulitis. Correlate with right-sided symptoms. Underlying colonic polyp or mass cannot be entirely excluded. 5. Portion of the urinary bladder and tree in a left inguinal hernia. 6. Incidental findings, including: Aortic atherosclerosis (ICD10-I70.0) and emphysema (ICD10-J43.9). Sinus disease. Bilateral nephrolithiasis. Electronically Signed   By: Abigail Miyamoto M.D.   On: 09/30/2019 14:47     Assessment and plan- Patient is a 69 y.o. 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  Based on PET scan patient has a fairly large right tonsillar mass measuring about 3.1 x 3.3 cm along with bulky right sided level 2 lymph nodes 2.9 x 2.6 cm as well as level 3 lymph nodes.  But in addition to that patient was found to have subcarinal adenopathy 2.9 x 1.8 cm along with bilateral subcentimeter lung nodules as well as 1 cm right lower lobe nodule with an SUV of 3.4.  This is overall concerning for metastatic disease from head and neck primary.  Patient has been a lifelong non-smoker.  At this time I would recommend getting super D CT chest to characterize during  adenopathy and lung nodules better.  I will refer him to pulmonary for EBUS guided biopsy of the subcarinal lymph nodes.  This will help [place] ascertain if it is a separate second malignancy (for example of which is adenocarcinoma or if it is p16 negative) versus metastases from head and neck primary which seems to be the more likely scenario.  I personally discussed this case with Dr. Baruch Gouty over the phone today.  Given his bulky head and neck disease as well as concerning for metastatic disease, I will proceed with palliative chemotherapy to downstage his head and neck tumor as well as to assess his response in the.  If he has an excellent response with 4 to 6 cycles of chemotherapy, we could consider definitive consolidative radiation along with chemotherapy down the line.  Although carboplatin 5-FU and Beryle Flock is now the recommended frontline regimen and metastatic head and neck cancer, I would favor using carboplatin Taxol and Keytruda as it would be better tolerated and will also be more efficacious if this turns out to be 2 separate malignancies.  I recommend carboplatin AUC 5 IV along with Taxol 175 mg per metered squared and Keytruda 200 mg IV given every 3 weeks for up to 6 cycles followed by Keytruda monotherapy.  I will plan to get repeat scans after 3 treatments.  Discussed risks and benefits of chemotherapy including all but not limited to nausea, vomiting, low blood counts, risk of infections and hospitalization.  Risk of autoimmune side effect such as colitis, thyroiditis, need to monitor kidney and liver function on Keytruda.  Treatment is being given with a palliative intent.  Patient understands and agrees to proceed as planned.  I will tentatively see him back in 1 week's time to start chemotherapy.  He will need chemo teach as well as port placement prior to chemo.  I will also refer him to dietary to help with nutrition counseling.  I do expect him to have a good response with  chemotherapy better enable him to swallow better.  Neoplasm related pain: I will start him on as needed oxycodone for his pain   Total face to face encounter time for this patient visit was 40 min.  Total non face to face encounter time for this patient on the day of the visit was 10 min. Time spent in discussing patients case with DR. Chrystal    Visit Diagnosis 1. 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 Goals of care, counseling/discussion   3. Neoplasm related pain      Dr. Randa Evens, MD, MPH [facility] at [facility] [phone] 10/06/2019 7:47 AM

## 2019-10-06 NOTE — Telephone Encounter (Signed)
10/06/2019 at 3:50 pm EST spoke with Nissa at [facility].  Procedure codes [phone], [id number], [phone] and [phone] are valid and billable codes which does not require PA. Call Ref # [id number]. Rhonda J Fariss

## 2019-10-06 NOTE — H&P (View-Only) (Signed)
Subjective:    Patient ID: Patrick Bering., male    DOB: 1951/07/01, 69 y.o.   MRN: [id number]  HPI Patient is a 69 year old lifelong never smoker who presents for evaluation of mediastinal adenopathy and RIGHT lower lobe lung nodule in the setting of recently diagnosed RIGHT palatine tonsil squamous cell carcinoma.  He is kindly referred by Dr. Randa Evens.  The patient states that since approximately the summer 2020 he has noted pain on his right face.  He has also noted firm and tender right cervical area.  He has not had any fevers, chills or sweats.  He was evaluated by ENT, Dr. 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  Patient underwent PET CT on 09/30/2019 which showed a large hypermetabolic right palatine tonsil mass which crossed midline, there was ipsilateral cervical nodal metastasis and bilateral pulmonary nodules the dominant of which is one-point centimeter in the right lung base.  The lung nodules have smooth borders.  Was also subcarinal and possible left hilar adenopathy.  He will need biopsy of these areas to rule out a synchronous primary lung neoplasm however, highly suspect that these are due to metastatic disease.  The patient has not had any fevers, chills or sweats.  No dyspnea over usual.  He does carry a diagnosis of asthma but is well controlled on Symbicort 160/4.5, 2 inhalations twice a day and as needed albuterol.  Does not endorse having any cough or sputum production.  No chest pain.  Does complain of right-sided facial/cervical pain.  This is related to his malignancy as above.  No nausea vomiting, abdominal pain anorexia or weight loss.  Patient's past medical history, surgical history and family history have been reviewed.  They are as noted.  Social history: Is a lifelong never smoker.  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  Patient does not have any military history.  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  Review of Systems A 10 point review of systems was performed and it is as noted above otherwise negative.    Objective:   Physical Exam GENERAL: Well-developed, overweight gentleman in no acute respiratory distress.  Fully ambulatory.  Voice is clear. HEAD: Normocephalic, atraumatic.  EYES: Pupils equal, round, reactive to light.  No scleral icterus.  MOUTH: Nose/mouth/throat not examined due to masking requirements for COVID 19. NECK: Supple. No thyromegaly. No nodules. No JVD.  Palpable cervical adenopathy, firm and tender to touch. PULMONARY: Lungs clear to auscultation bilaterally.  Symmetrical air entry. CARDIOVASCULAR: S1 and S2. Regular rate and rhythm.  No murmurs noted. GASTROINTESTINAL: Nondistended abdomen. MUSCULOSKELETAL: No joint swelling, no clubbing, no edema.  NEUROLOGIC: No focal deficits noted.  Speech is fluent. SKIN: Intact,warm,dry, no rashes noted on limited exam.  PSYCH: Mood and behavior is appropriate.   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  It is likely metastatic disease however synchronous tumor cannot be excluded  Patient will need bronchoscopy with endobronchial ultrasound and attempt at navigational bronchoscopy to dominant right lower lobe nodule (1 cm)  Benefits, limitations and potential complications of the procedure  were discussed with the patient/family  including, but not limited to bleeding, hemoptysis, respiratory failure requiring intubation and/or prolongued mechanical ventilation, infection, pneumothorax (collapse of lung) requiring chest tube placement, stroke from air embolism or even death  Patient agrees to  proceed with bronchoscopy with endobronchial ultrasound and navigational bronchoscopy  Patient understands the procedure will be under general anesthesia  Scheduled for 12 February 7:30 AM   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 Derrill Kay, MD [facility]    *This note was dictated using voice recognition software/Dragon.  Despite best efforts to proofread, errors can occur which can change the meaning.  Any change was purely unintentional.

## 2019-10-06 NOTE — Telephone Encounter (Signed)
Pre admit phone visit has been scheduled for 10/09/2019 between 10-1. Per Caryl Pina with PAT pt will not need repeat covid test, as he had one today.  Pt is aware and voiced his understanding.

## 2019-10-06 NOTE — Patient Instructions (Signed)
We will schedule your bronchoscopy for 7:30 in the morning on 12 February (Friday)

## 2019-10-07 ENCOUNTER — Ambulatory Visit
Admission: RE | Admit: 2019-10-07 | Discharge: 2019-10-07 | Disposition: A | Discharge status: Home / Self Care | Payer: Medicare HMO | Source: Ambulatory Visit | Attending: Oncology | Admitting: Oncology

## 2019-10-07 ENCOUNTER — Other Ambulatory Visit (INDEPENDENT_AMBULATORY_CARE_PROVIDER_SITE_OTHER): Payer: Self-pay | Admitting: Nurse Practitioner

## 2019-10-07 ENCOUNTER — Ambulatory Visit: Payer: Medicare HMO

## 2019-10-07 ENCOUNTER — Ambulatory Visit: Payer: Medicare HMO | Admitting: Oncology

## 2019-10-07 ENCOUNTER — Other Ambulatory Visit: Payer: Medicare HMO

## 2019-10-07 DIAGNOSIS — C099 Malignant neoplasm of tonsil, unspecified: Secondary | ICD-10-CM | POA: Diagnosis not present

## 2019-10-07 DIAGNOSIS — R918 Other nonspecific abnormal finding of lung field: Secondary | ICD-10-CM | POA: Diagnosis present

## 2019-10-07 DIAGNOSIS — R942 Abnormal results of pulmonary function studies: Secondary | ICD-10-CM | POA: Diagnosis present

## 2019-10-07 LAB — SARS CORONAVIRUS 2 (TAT 6-24 HRS): SARS Coronavirus 2: NEGATIVE

## 2019-10-07 MED ORDER — CEFAZOLIN SODIUM-DEXTROSE 2-4 GM/100ML-% IV SOLN: 2.0000 g | Freq: Once | INTRAVENOUS | Status: DC

## 2019-10-08 ENCOUNTER — Encounter
Admission: RE | Disposition: A | Discharge status: Home / Self Care | Payer: Self-pay | Source: Home / Self Care | Attending: Vascular Surgery

## 2019-10-08 ENCOUNTER — Other Ambulatory Visit: Payer: Self-pay

## 2019-10-08 ENCOUNTER — Encounter: Payer: Self-pay | Admitting: Vascular Surgery

## 2019-10-08 ENCOUNTER — Ambulatory Visit
Admission: RE | Admit: 2019-10-08 | Discharge: 2019-10-08 | Disposition: A | Discharge status: Home / Self Care | Payer: Medicare HMO | Attending: Vascular Surgery | Admitting: Vascular Surgery

## 2019-10-08 DIAGNOSIS — G893 Neoplasm related pain (acute) (chronic): Secondary | ICD-10-CM | POA: Diagnosis not present

## 2019-10-08 DIAGNOSIS — C099 Malignant neoplasm of tonsil, unspecified: Secondary | ICD-10-CM | POA: Insufficient documentation

## 2019-10-08 DIAGNOSIS — Z8042 Family history of malignant neoplasm of prostate: Secondary | ICD-10-CM | POA: Diagnosis not present

## 2019-10-08 DIAGNOSIS — Z809 Family history of malignant neoplasm, unspecified: Secondary | ICD-10-CM | POA: Diagnosis not present

## 2019-10-08 DIAGNOSIS — J45909 Unspecified asthma, uncomplicated: Secondary | ICD-10-CM | POA: Insufficient documentation

## 2019-10-08 DIAGNOSIS — Z79899 Other long term (current) drug therapy: Secondary | ICD-10-CM | POA: Diagnosis not present

## 2019-10-08 DIAGNOSIS — D490 Neoplasm of unspecified behavior of digestive system: Secondary | ICD-10-CM

## 2019-10-08 HISTORY — DX: Malignant neoplasm of tonsil, unspecified: C09.9

## 2019-10-08 HISTORY — DX: Other nonspecific abnormal finding of lung field: R91.8

## 2019-10-08 HISTORY — PX: PORTA CATH INSERTION: CATH118285

## 2019-10-08 SURGERY — PORTA CATH INSERTION
Anesthesia: Moderate Sedation

## 2019-10-08 MED ORDER — CEFAZOLIN SODIUM-DEXTROSE 2-4 GM/100ML-% IV SOLN
INTRAVENOUS | Status: AC
  Filled 2019-10-08: qty 100

## 2019-10-08 MED ORDER — FENTANYL CITRATE (PF) 100 MCG/2ML IJ SOLN
INTRAMUSCULAR | Status: DC | PRN
  Administered 2019-10-08: 25 ug via INTRAVENOUS
  Administered 2019-10-08: 50 ug via INTRAVENOUS

## 2019-10-08 MED ORDER — MIDAZOLAM HCL 5 MG/5ML IJ SOLN
INTRAMUSCULAR | Status: AC
  Filled 2019-10-08: qty 5

## 2019-10-08 MED ORDER — MIDAZOLAM HCL 2 MG/2ML IJ SOLN
INTRAMUSCULAR | Status: DC | PRN
  Administered 2019-10-08: 2 mg via INTRAVENOUS
  Administered 2019-10-08: 1 mg via INTRAVENOUS

## 2019-10-08 MED ORDER — SODIUM CHLORIDE 0.9 % IV SOLN: INTRAVENOUS | Status: DC

## 2019-10-08 MED ORDER — FENTANYL CITRATE (PF) 100 MCG/2ML IJ SOLN
INTRAMUSCULAR | Status: AC
  Filled 2019-10-08: qty 2

## 2019-10-08 MED ORDER — FAMOTIDINE 20 MG PO TABS: 40.0000 mg | ORAL_TABLET | Freq: Once | ORAL | Status: DC | PRN

## 2019-10-08 MED ORDER — METHYLPREDNISOLONE SODIUM SUCC 125 MG IJ SOLR: 125.0000 mg | Freq: Once | INTRAMUSCULAR | Status: DC | PRN

## 2019-10-08 MED ORDER — DIPHENHYDRAMINE HCL 50 MG/ML IJ SOLN: 50.0000 mg | Freq: Once | INTRAMUSCULAR | Status: DC | PRN

## 2019-10-08 MED ORDER — ONDANSETRON HCL 4 MG/2ML IJ SOLN: 4.0000 mg | Freq: Four times a day (QID) | INTRAMUSCULAR | Status: DC | PRN

## 2019-10-08 MED ORDER — MIDAZOLAM HCL 2 MG/ML PO SYRP: 8.0000 mg | ORAL_SOLUTION | Freq: Once | ORAL | Status: DC | PRN

## 2019-10-08 MED ORDER — HYDROMORPHONE HCL 1 MG/ML IJ SOLN: 1.0000 mg | Freq: Once | INTRAMUSCULAR | Status: DC | PRN

## 2019-10-08 SURGICAL SUPPLY — 11 items
DERMABOND ADVANCED (GAUZE/BANDAGES/DRESSINGS) ×2
DERMABOND ADVANCED .7 DNX12 (GAUZE/BANDAGES/DRESSINGS) ×1 IMPLANT
DRAPE INCISE IOBAN 66X45 STRL (DRAPES) ×3 IMPLANT
KIT PORT POWER 8FR ISP CVUE (Port) ×3 IMPLANT
NEEDLE ENTRY 21GA 7CM ECHOTIP (NEEDLE) ×3 IMPLANT
PACK ANGIOGRAPHY (CUSTOM PROCEDURE TRAY) ×3 IMPLANT
SET INTRO CAPELLA COAXIAL (SET/KITS/TRAYS/PACK) ×3 IMPLANT
SUT MNCRL AB 4-0 PS2 18 (SUTURE) ×3 IMPLANT
SUT PROLENE 0 CT 1 30 (SUTURE) ×3 IMPLANT
SUT VIC AB 3-0 SH 27 (SUTURE) ×2
SUT VIC AB 3-0 SH 27X BRD (SUTURE) ×1 IMPLANT

## 2019-10-08 NOTE — Op Note (Signed)
OPERATIVE NOTE   PROCEDURE: 1. Placement of a right IJ Infuse-a-Port  PRE-OPERATIVE DIAGNOSIS: Carcinoma of the tonsil  POST-OPERATIVE DIAGNOSIS: Same  SURGEON: Katha Cabal M.D.  ANESTHESIA: Conscious sedation was administered under my direct supervision by the interventional radiology RN. IV Versed plus fentanyl were utilized. Continuous ECG, pulse oximetry and blood pressure was monitored throughout the entire procedure. Conscious sedation was for a total of 40 minutes.  ESTIMATED BLOOD LOSS: Minimal   FINDING(S): 1.  Patent vein  SPECIMEN(S): None  INDICATIONS:   Patrick Mendoza. is a 69 y.o. male who presents with biopsy-proven carcinoma of the tonsil.  He will require appropriate parenteral access for his chemotherapy and therefore is undergoing placement of an Infuse-a-Port.  Risks and benefits of been reviewed all questions answered patient has agreed to proceed.  DESCRIPTION: After obtaining full informed written consent, the patient was brought back to the special procedure suite and placed in the supine position. The patient's right neck and chest wall are prepped and draped in sterile fashion. Appropriate timeout was called.  Ultrasound is placed in a sterile sleeve, ultrasound is utilized to avoid vascular injury as well as secondary to lack of appropriate landmarks. The right internal jugular vein is identified. It is echolucent and homogeneous as well as easily compressible indicating patency. An image is recorded for the permanent record.  Access to the vein with a micropuncture needle is done under direct ultrasound visualization.  1% lidocaine is infiltrated into the soft tissue at the base of the neck as well as on the chest wall.  Under direct ultrasound visualization a micro-needle is inserted into the vein followed by the micro-wire. Micro-sheath was then advanced and a J wire is inserted without difficulty under fluoroscopic guidance. A small counterincision  was created at the wire insertion site. A transverse incision is created 2 fingerbreadths below the scapula and a pocket is fashioned using both blunt and sharp dissection. The pocket is tested for appropriate size with the hub of the Infuse-a-Port. The tunneling device is then used to pull the intravascular portion of the catheter from the pocket to the neck counterincision.  Dilator and peel-away sheath were then inserted over the wire and the wire is removed. Catheter is then advanced into the venous system without difficulty. Peel-away sheath was then removed.  Catheter is then positioned under fluoroscopic guidance at the atrial caval junction. It is then transected connected to the hub and the hope is slipped into the subcutaneous pocket on the chest wall. The hub was then accessed percutaneously and aspirates easily and flushes well and is flushed with 30 cc of heparinized saline. The pocket incision is then closed in layers using interrupted 3-0 Vicryl for the subcutaneous tissues and 4-0 Monocryl subcuticular for skin closure. Dermabond is applied. The neck counterincision was closed with 4-0 Monocryl subcuticular and Dermabond as well.  The patient tolerated the procedure well and there were no immediate complications.  COMPLICATIONS: None  CONDITION: Unchanged  Katha Cabal M.D. [facility] Office: [phone]   10/08/2019, 10:55 AM

## 2019-10-08 NOTE — Discharge Instructions (Signed)
Implanted Port Insertion, Care After This sheet gives you information about how to care for yourself after your procedure. Your health care provider may also give you more specific instructions. If you have problems or questions, contact your health care provider. What can I expect after the procedure? After the procedure, it is common to have:  Discomfort at the port insertion site.  Bruising on the skin over the port. This should improve over 3-4 days. Follow these instructions at home: [facility] care  After your port is placed, you will get a manufacturer's information card. The card has information about your port. Keep this card with you at all times.  Take care of the port as told by your health care provider. Ask your health care provider if you or a family member can get training for taking care of the port at home. A home health care nurse may also take care of the port.  Make sure to remember what type of port you have. Incision care      Follow instructions from your health care provider about how to take care of your port insertion site. Make sure you: ? Wash your hands with soap and water before and after you change your bandage (dressing). If soap and water are not available, use hand sanitizer. ? Change your dressing as told by your health care provider. ? Leave stitches (sutures), skin glue, or adhesive strips in place. These skin closures may need to stay in place for 2 weeks or longer. If adhesive strip edges start to loosen and curl up, you may trim the loose edges. Do not remove adhesive strips completely unless your health care provider tells you to do that.  Check your port insertion site every day for signs of infection. Check for: ? Redness, swelling, or pain. ? Fluid or blood. ? Warmth. ? Pus or a bad smell. Activity  Return to your normal activities as told by your health care provider. Ask your health care provider what activities are safe for you.  Do not  lift anything that is heavier than 10 lb (4.5 kg), or the limit that you are told, until your health care provider says that it is safe. General instructions  Take over-the-counter and prescription medicines only as told by your health care provider.  Do not take baths, swim, or use a hot tub until your health care provider approves. Ask your health care provider if you may take showers. You may only be allowed to take sponge baths.  Do not drive for 24 hours if you were given a sedative during your procedure.  Wear a medical alert bracelet in case of an emergency. This will tell any health care providers that you have a port.  Keep all follow-up visits as told by your health care provider. This is important. Contact a health care provider if:  You cannot flush your port with saline as directed, or you cannot draw blood from the port.  You have a fever or chills.  You have redness, swelling, or pain around your port insertion site.  You have fluid or blood coming from your port insertion site.  Your port insertion site feels warm to the touch.  You have pus or a bad smell coming from the port insertion site. Get help right away if:  You have chest pain or shortness of breath.  You have bleeding from your port that you cannot control. Summary  Take care of the port as told by your health  care provider. Keep the manufacturer's information card with you at all times.  Change your dressing as told by your health care provider.  Contact a health care provider if you have a fever or chills or if you have redness, swelling, or pain around your port insertion site.  Keep all follow-up visits as told by your health care provider. This information is not intended to replace advice given to you by your health care provider. Make sure you discuss any questions you have with your health care provider. Document Revised: 03/12/2018 Document Reviewed: 03/12/2018 Elsevier Patient Education   [place].   Moderate Conscious Sedation, Adult, Care After These instructions provide you with information about caring for yourself after your procedure. Your health care provider may also give you more specific instructions. Your treatment has been planned according to current medical practices, but problems sometimes occur. Call your health care provider if you have any problems or questions after your procedure. What can I expect after the procedure? After your procedure, it is common:  To feel sleepy for several hours.  To feel clumsy and have poor balance for several hours.  To have poor judgment for several hours.  To vomit if you eat too soon. Follow these instructions at home: For at least 24 hours after the procedure:   Do not: ? Participate in activities where you could fall or become injured. ? Drive. ? Use heavy machinery. ? Drink alcohol. ? Take sleeping pills or medicines that cause drowsiness. ? Make important decisions or sign legal documents. ? Take care of children on your own.  Rest. Eating and drinking  Follow the diet recommended by your health care provider.  If you vomit: ? Drink water, juice, or soup when you can drink without vomiting. ? Make sure you have little or no nausea before eating solid foods. General instructions  Have a responsible adult stay with you until you are awake and alert.  Take over-the-counter and prescription medicines only as told by your health care provider.  If you smoke, do not smoke without supervision.  Keep all follow-up visits as told by your health care provider. This is important. Contact a health care provider if:  You keep feeling nauseous or you keep vomiting.  You feel light-headed.  You develop a rash.  You have a fever. Get help right away if:  You have trouble breathing. This information is not intended to replace advice given to you by your health care provider. Make sure you discuss  any questions you have with your health care provider. Document Revised: 07/27/2017 Document Reviewed: 12/04/2015 Elsevier Patient Education  2020 [employer].

## 2019-10-08 NOTE — H&P (Signed)
[facility] History & Physical Update  The patient was interviewed and re-examined.  The patient's previous History and Physical has been reviewed and is unchanged.  There is no change in the plan of care. We plan to proceed with the scheduled procedure.  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 Janese Banks.  He will require chemotherapy and therefore requires appropriate parenteral access.  The risks and benefits for Infuse-a-Port placement have been reviewed with the patient.  He wishes to proceed.  Hortencia Pilar, MD  10/08/2019, 9:57 AM

## 2019-10-09 ENCOUNTER — Inpatient Hospital Stay (HOSPITAL_BASED_OUTPATIENT_CLINIC_OR_DEPARTMENT_OTHER): Payer: Medicare HMO | Admitting: Oncology

## 2019-10-09 ENCOUNTER — Encounter
Admission: RE | Admit: 2019-10-09 | Discharge: 2019-10-09 | Disposition: A | Discharge status: Home / Self Care | Payer: Medicare HMO | Source: Ambulatory Visit | Attending: Pulmonary Disease | Admitting: Pulmonary Disease

## 2019-10-09 DIAGNOSIS — R590 Localized enlarged lymph nodes: Secondary | ICD-10-CM | POA: Diagnosis not present

## 2019-10-09 DIAGNOSIS — Z01810 Encounter for preprocedural cardiovascular examination: Secondary | ICD-10-CM | POA: Insufficient documentation

## 2019-10-09 DIAGNOSIS — R911 Solitary pulmonary nodule: Secondary | ICD-10-CM | POA: Diagnosis not present

## 2019-10-09 DIAGNOSIS — I440 Atrioventricular block, first degree: Secondary | ICD-10-CM | POA: Diagnosis not present

## 2019-10-09 DIAGNOSIS — J45909 Unspecified asthma, uncomplicated: Secondary | ICD-10-CM | POA: Diagnosis not present

## 2019-10-09 DIAGNOSIS — C099 Malignant neoplasm of tonsil, unspecified: Secondary | ICD-10-CM

## 2019-10-09 DIAGNOSIS — R9431 Abnormal electrocardiogram [ECG] [EKG]: Secondary | ICD-10-CM | POA: Insufficient documentation

## 2019-10-09 DIAGNOSIS — I4510 Unspecified right bundle-branch block: Secondary | ICD-10-CM | POA: Diagnosis not present

## 2019-10-09 NOTE — Patient Instructions (Signed)
Your procedure is scheduled on: 10/10/19 Report to [facility]. To find out your arrival time please call [phone] between 1PM - 3PM on 10/09/19.  Remember: Instructions that are not followed completely may result in serious medical risk, up to and including death, or upon the discretion of your surgeon and anesthesiologist your surgery may need to be rescheduled.     _X__ 1. Do not eat food after midnight the night before your procedure.                 No gum chewing or hard candies. You may drink clear liquids up to 2 hours                 before you are scheduled to arrive for your surgery- DO not drink clear                 liquids within 2 hours of the start of your surgery.                 Clear Liquids include:  water, apple juice without pulp, clear carbohydrate                 drink such as Clearfast or Gatorade, Black Coffee or Tea (Do not add                 anything to coffee or tea). Diabetics water only  __X__2.  On the morning of surgery brush your teeth with toothpaste and water, you                 may rinse your mouth with mouthwash if you wish.  Do not swallow any              toothpaste of mouthwash.     _X__ 3.  No Alcohol for 24 hours before or after surgery.   _X__ 4.  Do Not Smoke or use e-cigarettes For 24 Hours Prior to Your Surgery.                 Do not use any chewable tobacco products for at least 6 hours prior to                 surgery.  ____  5.  Bring all medications with you on the day of surgery if instructed.   __X__  6.  Notify your doctor if there is any change in your medical condition      (cold, fever, infections).     Do not wear jewelry, make-up, hairpins, clips or nail polish. Do not wear lotions, powders, or perfumes.  Do not shave 48 hours prior to surgery. Men may shave face and neck. Do not bring valuables to the hospital.    [facility] is not responsible for any belongings or  valuables.  Contacts, dentures/partials or body piercings may not be worn into surgery. Bring a case for your contacts, glasses or hearing aids, a denture cup will be supplied. Leave your suitcase in the car. After surgery it may be brought to your room. For patients admitted to the hospital, discharge time is determined by your treatment team.   Patients discharged the day of surgery will not be allowed to drive home.   Please read over the following fact sheets that you were given:   MRSA Information  __X__ Take these medicines the morning of surgery with A SIP OF WATER:  1. Oxycodone if needed  2.   3.   4.  5.  6.  ____ Fleet Enema (as directed)   ____ Use CHG Soap/SAGE wipes as directed  __X__ Use inhalers on the day of surgery  SPIRIVA AND ALBUTEROL   ____ Stop metformin/Janumet/Farxiga 2 days prior to surgery    ____ Take 1/2 of usual insulin dose the night before surgery. No insulin the morning          of surgery.   ____ Stop Blood Thinners Coumadin/Plavix/Xarelto/Pleta/Pradaxa/Eliquis/Effient/Aspirin  on   Or contact your Surgeon, Cardiologist or Medical Doctor regarding  ability to stop your blood thinners  __X__ Stop Anti-inflammatories 7 days before surgery such as Advil, Ibuprofen, Motrin,  BC or Goodies Powder, Naprosyn, Naproxen, Aleve, Aspirin    __X__ Stop all herbal supplements, fish oil or vitamin E until after surgery.    ____ Bring C-Pap to the hospital.

## 2019-10-09 NOTE — Progress Notes (Signed)
[place]  [phone] [phone]  Patient Care Team: Tommy Medal, MD as PCP - General (Family Medicine) Sindy Guadeloupe, MD as Consulting Physician (Oncology)   Name of the patient: Patrick Mendoza  [id number]  05/24/1951   Date of visit: 10/09/19  Diagnosis- Tonsil Cancer   Chief complaint/Reason for visit- Initial Meeting for [facility], preparing for starting chemotherapy  Virtual Visit via Telephone Note  I connected with Patrick Mendoza. on 10/10/19 at  9:00 AM EST by telephone and verified that I am speaking with the correct person using two identifiers.  Location: Patient: Home Provider: Office   I discussed the limitations, risks, security and privacy concerns of performing an evaluation and management service by telephone and the availability of in person appointments. I also discussed with the patient that there may be a patient responsible charge related to this service. The patient expressed understanding and agreed to proceed.   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[place])   10/13/2019 -  Chemotherapy   The patient had palonosetron (ALOXI) injection 0.25 mg, 0.25 mg, Intravenous,  Once, 0 of 6 cycles pegfilgrastim-jmdb (FULPHILA) injection 6 mg, 6 mg, Subcutaneous,  Once, 0 of 6 cycles CARBOplatin (PARAPLATIN) in sodium chloride 0.9 % 100 mL chemo infusion, , Intravenous,  Once, 0 of 6 cycles fosaprepitant (EMEND) 150 mg in sodium chloride 0.9 % 145 mL IVPB, 150 mg, Intravenous,  Once, 0 of 6 cycles PACLitaxel (TAXOL) 444 mg in sodium chloride 0.9 % 500 mL chemo infusion (> 30m/m2), 200 mg/m2, Intravenous,  Once, 0 of 6 cycles pembrolizumab (KEYTRUDA) 200 mg in sodium chloride 0.9 % 50 mL chemo infusion, 200 mg, Intravenous, Once, 0 of 6  cycles  for chemotherapy treatment.      Interval history-  Patrick Mendoza a 69year old male who presents to chemo care clinic today for initial meeting in preparation for starting chemotherapy. I introduced the chemo care clinic and we discussed that the role of the clinic is to assist those who are at an increased risk of emergency room visits and/or complications during the course of chemotherapy treatment. We discussed that the increased risk takes into account factors such as age, performance status, and co-morbidities. We also discussed that for some, this might include barriers to care such as not having a primary care provider, lack of insurance/transportation, or not being able to afford medications. We discussed that the goal of the program is to help prevent unplanned ER visits and help reduce complications during chemotherapy. We do this by discussing specific risk factors to each individual and identifying ways that we can help improve these risk factors and reduce barriers to care.   ECOG FS:1 - Symptomatic but completely ambulatory  Review of systems- Review of Systems  Constitutional: Negative.  Negative for chills, fever, malaise/fatigue and weight loss.  HENT: Positive for sore throat. Negative for congestion, ear pain and tinnitus.   Eyes: Negative.  Negative for blurred vision and double vision.  Respiratory: Negative.  Negative for cough, sputum production and shortness of breath.   Cardiovascular: Negative.  Negative for chest pain, palpitations and leg swelling.  Gastrointestinal: Negative.  Negative for abdominal pain, constipation, diarrhea, nausea and vomiting.  Genitourinary: Negative for dysuria, frequency and urgency.  Musculoskeletal: Negative for back pain and falls.  Skin: Negative.  Negative for rash.  Neurological: Negative.  Negative for weakness and headaches.  Endo/Heme/Allergies: Negative.  Does not bruise/bleed easily.  Psychiatric/Behavioral: Negative.   Negative for depression. The patient is not nervous/anxious and does not have insomnia.      Current treatment- Beginning treatment with carbo/taxol and keytruda q 3 weeks X 6 cycles  No Known Allergies  Past Medical History:  Diagnosis Date  . Asthma   . Lung nodules   . Tonsil cancer [facility])     Past Surgical History:  Procedure Laterality Date  . CERVICAL SPINE SURGERY    . LUMBAR SPINE SURGERY    . PORTA CATH INSERTION N/A 10/08/2019   Procedure: PORTA CATH INSERTION;  Surgeon: Katha Cabal, MD;  Location: [place] [facility];  Service: Cardiovascular;  Laterality: N/A;    Social History   Socioeconomic History  . Marital status: Widowed    Spouse name: Not on file  . Number of children: Not on file  . Years of education: Not on file  . Highest education level: Not on file  Occupational History  . Not on file  Tobacco Use  . Smoking status: Never Smoker  . Smokeless tobacco: Never Used  Substance and Sexual Activity  . Alcohol use: Never  . Drug use: Never  . Sexual activity: Not on file  Other Topics Concern  . Not on file  Social History Narrative  . Not on file   Social Determinants of Health   Financial Resource Strain:   . Difficulty of Paying Living Expenses: Not on file  Food Insecurity:   . Worried About Charity fundraiser in the Last Year: Not on file  . Ran Out of Food in the Last Year: Not on file  Transportation Needs:   . Lack of Transportation (Medical): Not on file  . Lack of Transportation (Non-Medical): Not on file  Physical Activity:   . Days of Exercise per Week: Not on file  . Minutes of Exercise per Session: Not on file  Stress:   . Feeling of Stress : Not on file  Social Connections:   . Frequency of Communication with Friends and Family: Not on file  . Frequency of Social Gatherings with Friends and Family: Not on file  . Attends Religious Services: Not on file  . Active Member of Clubs or Organizations: Not on file  .  Attends Archivist Meetings: Not on file  . Marital Status: Not on file  Intimate Partner Violence:   . Fear of Current or Ex-Partner: Not on file  . Emotionally Abused: Not on file  . Physically Abused: Not on file  . Sexually Abused: Not on file    Family History  Problem Relation Age of Onset  . Cancer Mother 1       not sure what type  . Prostate cancer Father   . Rheum arthritis Father   . Healthy Sister   . Healthy Brother   . Healthy Sister   . Healthy Brother   . Healthy Brother   . Heart disease Brother   . Heart disease Brother      Current Outpatient Medications:  .  acetaminophen (TYLENOL) 500 MG tablet, Take 500-1,000 mg by mouth every 8 (eight) hours as needed (for pain.). , Disp: , Rfl:  .  albuterol (VENTOLIN HFA) 108 (90 Base) MCG/ACT inhaler, Inhale 2 puffs into the lungs every 6 (six) hours as needed (wheezing/shortness of breath). , Disp: ,  Rfl:  .  dexamethasone (DECADRON) 4 MG tablet, Take 2 tablets by mouth once a day for 3 days after chemo. Take with food., Disp: 30 tablet, Rfl: 1 .  lidocaine-prilocaine (EMLA) cream, Apply to affected area once, Disp: 30 g, Rfl: 3 .  LORazepam (ATIVAN) 0.5 MG tablet, Take 1 tablet (0.5 mg total) by mouth every 6 (six) hours as needed (Nausea or vomiting)., Disp: 30 tablet, Rfl: 0 .  ondansetron (ZOFRAN) 8 MG tablet, Take 1 tablet (8 mg total) by mouth 2 (two) times daily as needed. Start on the third day after chemotherapy., Disp: 30 tablet, Rfl: 1 .  oxyCODONE (OXY IR/ROXICODONE) 5 MG immediate release tablet, Take 1 tablet (5 mg total) by mouth every 4 (four) hours as needed for severe pain., Disp: 60 tablet, Rfl: 0 .  prochlorperazine (COMPAZINE) 10 MG tablet, Take 1 tablet (10 mg total) by mouth every 6 (six) hours as needed (Nausea or vomiting)., Disp: 30 tablet, Rfl: 1 .  SYMBICORT 160-4.5 MCG/ACT inhaler, Inhale 2 puffs into the lungs 2 (two) times daily., Disp: , Rfl:   Physical exam: There were no  vitals filed for this visit. Limited d/t telephone visit  CMP Latest Ref Rng & Units 10/02/2019  Glucose 70 - 99 mg/dL 105(H)  BUN 8 - 23 mg/dL 22  Creatinine 0.61 - 1.24 mg/dL 1.03  Sodium 135 - 145 mmol/L 137  Potassium 3.5 - 5.1 mmol/L 4.5  Chloride 98 - 111 mmol/L 105  CO2 22 - 32 mmol/L 24  Calcium 8.9 - 10.3 mg/dL 9.6  Total Protein 6.5 - 8.1 g/dL 7.7  Total Bilirubin 0.3 - 1.2 mg/dL 0.6  Alkaline Phos 38 - 126 U/L 64  AST 15 - 41 U/L 18  ALT 0 - 44 U/L 14   CBC Latest Ref Rng & Units 10/02/2019  WBC 4.0 - 10.5 K/uL 7.5  Hemoglobin 13.0 - 17.0 g/dL 14.4  Hematocrit 39.0 - 52.0 % 44.2  Platelets 150 - 400 K/uL 359    No images are attached to the encounter.  DG Chest 2 View  Result Date: 09/10/2019 CLINICAL DATA:  Dyspnea.  COPD. EXAM: CHEST - 2 VIEW COMPARISON:  05/08/2012 chest radiograph. FINDINGS: Stable cardiomediastinal silhouette with normal heart size. No pneumothorax. No pleural effusion. Faint hazy opacities at both lung bases. No pulmonary edema. IMPRESSION: Faint hazy opacities at the lung bases, cannot exclude atypical pneumonia. Suggest follow-up chest radiographs. Electronically Signed   By: Ilona Sorrel M.D.   On: 09/10/2019 15:17   PERIPHERAL VASCULAR CATHETERIZATION  Result Date: 10/08/2019 See Op Note  NM PET Image Initial (PI) Skull Base To Thigh  Result Date: 09/30/2019 CLINICAL DATA:  Initial treatment strategy for pharyngeal mass. Primary squamous cell carcinoma of palatine tonsil. EXAM: NUCLEAR MEDICINE PET SKULL BASE TO THIGH TECHNIQUE: 12.2 mCi F-18 FDG was injected intravenously. Full-ring PET imaging was performed from the skull base to thigh after the radiotracer. CT data was obtained and used for attenuation correction and anatomic localization. Fasting blood glucose: 103 mg/dl COMPARISON:  None. FINDINGS: Mediastinal blood pool activity: SUV max 2.4 Liver activity: SUV max NA NECK: Right-sided palatine tonsil primary extends across the midline,  impressing upon the airway. Measures 3.1 x 3.3 cm and a S.U.V. max of 14.0 on 25/3. Bulky right-sided level 2 nodes, including at 2.9 x 2.6 cm and a S.U.V. max of 11.8 on 34/3. Right-sided level 3 node measures 1.7 x 2.0 cm and a S.U.V. max of 3.5 on 44/3. Incidental  CT findings: No left-sided cervical adenopathy. Right greater than left ethmoid air cell mucosal thickening with complete opacification of the imaged right frontal sinus. Clear mastoid air cells. CHEST: A right lung base (just along the right major fissure) nodule measures 1.0 cm and a S.U.V. max of 3.4 on 90/3. Subcarinal adenopathy at 2.9 x 1.8 cm and a S.U.V. max of 7.2 on 86/3. Hypermetabolism, possibly corresponding to a small node, about the medial aspect of the left hilum. This measures a S.U.V. max of 4.5 on 79/3. Incidental CT findings: Aortic atherosclerosis. Mild centrilobular emphysema. Right lower lobe 5 mm pulmonary nodule on 85/3. 7 mm left lower lobe pulmonary nodule on 105/3. Superior segment left lower lobe 7 mm nodule on 81/3. ABDOMEN/PELVIS: Hypermetabolism involves the ascending colon, just above the ileocecal junction. This corresponds to suggestion of wall thickening and possible pericolonic edema, including on 168/3. Measures a S.U.V. max of 9.3. Incidental CT findings: Normal adrenal glands. Left larger than right renal collecting system calculi including at up to 7 mm. Lower pole 2.9 cm right renal low-density lesion is likely a cyst. Left larger than right fat containing inguinal hernias. The left-sided inguinal hernia contains a portion of the urinary bladder, including on 230/3. SKELETON: No abnormal marrow activity. Incidental CT findings: none IMPRESSION: 1. Large hypermetabolic right palatine tonsil mass which crosses the midline. Ipsilateral cervical nodal metastasis. 2. Bilateral pulmonary nodules, including a dominant 1.0 cm hypermetabolic right lung base nodule. Given multiplicity, favor pulmonary metastasis. 3.  Subcarinal and equivocal left perihilar nodal metastasis, presumably from head neck primary. Less likely, dominant right lower lobe pulmonary nodule could represent a synchronous primary lung neoplasm. 4. Hypermetabolism with subtle soft tissue thickening involving the ascending colon. Question mild diverticulitis. Correlate with right-sided symptoms. Underlying colonic polyp or mass cannot be entirely excluded. 5. Portion of the urinary bladder and tree in a left inguinal hernia. 6. Incidental findings, including: Aortic atherosclerosis (ICD10-I70.0) and emphysema (ICD10-J43.9). Sinus disease. Bilateral nephrolithiasis. Electronically Signed   By: Abigail Miyamoto M.D.   On: 09/30/2019 14:47   CT Super D Chest Wo Contrast  Result Date: 10/07/2019 CLINICAL DATA:  Tonsillar cancer with recent PET scan showing hypermetabolic pulmonary nodules, scheduled for bronchoscopy. EXAM: CT CHEST WITHOUT CONTRAST TECHNIQUE: Multidetector CT imaging of the chest was performed using thin slice collimation for electromagnetic bronchoscopy planning purposes, without intravenous contrast. COMPARISON:  PET exam of 09/30/2019 FINDINGS: Cardiovascular: Minimal calcified atherosclerosis of the thoracic aorta. Heart size normal without pericardial effusion. Central pulmonary vasculature is unremarkable on noncontrast imaging, not well assessed. Mediastinum/Nodes: Low neck, right level 2 lymph nodes are partially imaged, 1.6 cm area of soft tissue behind the right sternocleidomastoid muscle corresponding to this abnormality along its inferior margin better demonstrated on the recent PET exam. Bulky subcarinal lymphadenopathy largest at 2.4 cm short axis. Mildly enlarged left perihilar nodal tissue approximately 1.2 cm (image 29, series 2) No sign of right paratracheal lymphadenopathy or anterior mediastinal adenopathy. Lungs/Pleura: Scattered pulmonary nodules largest at the right lung base measuring 1 x 1 cm (image 34, series 3) this is  immediately along the fissure in the anterior right lower lobe along the right hemidiaphragm. Small juxtapleural nodule in the left chest (image 44, series 3) 8 mm. Similarly sized nodule seen in the periphery of the superior segment of the left lower lobe. Small pulmonary nodule in the right upper lobe (image 24, series 3) 6 mm. No signs of pleural effusion. No dense consolidation. Also with small nodule in in  the right lower lobe approximately 5 mm in the superior segment. Upper Abdomen: Lobular hepatic contours. Nonspecific finding. No signs of splenomegaly. Fatty replacement of much of pancreatic parenchyma. Adrenal glands are normal. No acute findings in the upper abdomen. Musculoskeletal: IMPRESSION: 1. Scattered pulmonary nodules, largest at the right lung base measuring 1 x 1 cm. Findings remain concerning for metastatic disease. 2. Bulky subcarinal and mild left perihilar adenopathy, also likely due to metastatic disease. 3. Low neck, right level 2 lymph nodes are partially imaged, better demonstrated on the recent PET exam. Aortic Atherosclerosis (ICD10-I70.0). Electronically Signed   By: Zetta Bills M.D.   On: 10/07/2019 16:38   Assessment and plan- Patient is a 69 y.o. male who presents to [facility] for initial meeting in preparation for starting chemotherapy for the treatment of tonsil cancer.    1. HPI: Patient presented to Dr. Richardson Landry for symptoms of sore throat and difficulty swallowing which he has been experiencing over the past 1 year.  Underwent a comprehensive autolaryngoscopy  which showed a firm enlarged right tonsillar mass with necrotic area extending onto the adjacent soft palate into the root of the uvula.  Hypopharynx and larynx as well as nasopharynx were unable to be visualized due to gag reflex.  This was biopsied and was consistent with a nonkeratinizing invasive squamous cell carcinoma positive for p16.  PET scan showed hypermetabolic right palatine tonsil mass with  crosses the midline.  Ipsilateral cervical nodal metastasis and bilateral pulmonary nodules including a 1 cm hypermetabolic right lung base nodule.  Multiplicity favor pulmonary metastasis.  Hypermetabolism involving the ascending colon.  Question mild diverticulitis versus underlying colonic mass or polyp cannot be excluded.  2. Chemo Care Clinic/High Risk for ER/Hospitalization during chemotherapy- We discussed the role of the chemo care clinic and identified patient specific risk factors. I discussed that patient was identified as high risk primarily based on: Stage of disease.  Patient has past medical history positive for: Past Medical History:  Diagnosis Date  . Asthma   . Lung nodules   . Tonsil cancer [facility])     Patient has past surgical history positive for: Past Surgical History:  Procedure Laterality Date  . CERVICAL SPINE SURGERY    . LUMBAR SPINE SURGERY    . PORTA CATH INSERTION N/A 10/08/2019   Procedure: PORTA CATH INSERTION;  Surgeon: Katha Cabal, MD;  Location: [place] [facility];  Service: Cardiovascular;  Laterality: N/A;    Based on our high risk symptom management report; this patient has a high risk of ED utilization.  The percentage below indicates how "at risk "  this patient based on the factors in this table within one year.  General Risk Score: 4  Values used to calculate this score:   Points  Metrics      1        Age: 69      2        Hospital Admissions: 2      0        ED Visits: 0      1        Has Chronic Obstructive Pulmonary Disease: Yes      0        Has Diabetes: No      0        Has Congestive Heart Failure: No      0        Has liver disease: No  0        Has Depression: No      0        Current PCP: Tommy Medal, MD      0        Has Medicaid: No  Risk Score- 32% based on criteria listed above.    3. We discussed that social determinants of health may have significant impacts on health and outcomes for cancer patients.   Today we discussed specific social determinants of performance status, alcohol use, depression, financial needs, food insecurity, housing, interpersonal violence, social connections, stress, tobacco use, and transportation.    After lengthy discussion the following were identified as areas of need: None at this time.   We discussed options including home based and outpatient services, DME, and CARE program. We discusssed that patients who participate in regular physical activity report fewer negative impacts of cancer and treatments and report less fatigue.  We discussed self-referral to [facility] for counseling services, psychiatry for medication management, or palliative care/symptom management as well as primary care providers. We discussed that living with cancer can create tremendous financial burden.  We discussed options for assistance. I asked that if assistance is needed in affording medications or paying bills to please let us know so that we can provide assistance.  We discussed options for food including social services.  We will also notify [facility] to see if cancer center can provide support.  Patient informed of food pantry at cancer center and was provided with care package today.  Please notify nursing if un-met needs. We discussed referral to social work. Will also discuss with Elease Etienne to see if [facility] can provide support of utility bills, etc. We discussed options for support groups at the cancer center. If interested, please notify nurse navigator to enroll.  We discussed options for managing stress including healthy eating, exercise as well as participating in no charge counseling services at the cancer center and support groups.  If these are of interest, patient can notify either myself or primary nursing team. We discussed options for management including medications and referral to quit Smart program. We discussed options for transportation including [employer],  paratransit, bus routes, link transit, taxi/uber/lyft, and cancer center van.  I have notified primary oncology team who will help assist with arranging Lucianne Lei transportation for appointments when needed. We also discussed options for transportation on short notice/acute visits.   Plan:  Discuss resources that are available.  Discuss symptom management. Reviewed medications.  Patient asking for a prescription of Magic mouthwash Moishe Spice, RN called mouthwash into pharmacy.  Disposition: RTC on 10/13/2019 to begin chemotherapy.  We also discussed the role of the Symptom Management Clinic at [facility] for acute issues and methods of contacting clinic/provider. He denies needing specific assistance at this time and He will be followed by Moishe Spice, RN (Nurse Navigator).   Visit Diagnosis 1. 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 Tonsil cancer [facility])     Patient expressed understanding and was in agreement with this plan. He also understands that He can call clinic at any time with any questions, concerns, or complaints.   I provided 15 minutes of non face-to-face telephone visit time during this encounter, and > 50% was spent counseling as documented under my assessment & plan.   [facility] [place]  CC: Dr. Janese Banks

## 2019-10-10 ENCOUNTER — Other Ambulatory Visit: Payer: Self-pay

## 2019-10-10 ENCOUNTER — Encounter: Payer: Self-pay | Admitting: Pulmonary Disease

## 2019-10-10 ENCOUNTER — Ambulatory Visit
Admission: RE | Admit: 2019-10-10 | Discharge: 2019-10-10 | Disposition: A | Discharge status: Home / Self Care | Payer: Medicare HMO | Attending: Pulmonary Disease | Admitting: Pulmonary Disease

## 2019-10-10 ENCOUNTER — Ambulatory Visit: Payer: Medicare HMO | Admitting: Anesthesiology

## 2019-10-10 ENCOUNTER — Encounter: Payer: Self-pay | Admitting: Oncology

## 2019-10-10 ENCOUNTER — Encounter
Admission: RE | Disposition: A | Discharge status: Home / Self Care | Payer: Self-pay | Source: Home / Self Care | Attending: Pulmonary Disease

## 2019-10-10 ENCOUNTER — Other Ambulatory Visit: Payer: Self-pay | Admitting: *Deleted

## 2019-10-10 DIAGNOSIS — R918 Other nonspecific abnormal finding of lung field: Secondary | ICD-10-CM | POA: Insufficient documentation

## 2019-10-10 DIAGNOSIS — R590 Localized enlarged lymph nodes: Secondary | ICD-10-CM

## 2019-10-10 DIAGNOSIS — C771 Secondary and unspecified malignant neoplasm of intrathoracic lymph nodes: Secondary | ICD-10-CM | POA: Insufficient documentation

## 2019-10-10 DIAGNOSIS — C099 Malignant neoplasm of tonsil, unspecified: Secondary | ICD-10-CM | POA: Diagnosis not present

## 2019-10-10 HISTORY — PX: VIDEO BRONCHOSCOPY WITH ENDOBRONCHIAL NAVIGATION: SHX6175

## 2019-10-10 HISTORY — PX: VIDEO BRONCHOSCOPY WITH ENDOBRONCHIAL ULTRASOUND: SHX6177

## 2019-10-10 SURGERY — BRONCHOSCOPY, WITH EBUS
Anesthesia: General

## 2019-10-10 MED ORDER — SODIUM CHLORIDE 0.9 % IV SOLN: INTRAVENOUS | Status: DC | PRN

## 2019-10-10 MED ORDER — SUGAMMADEX SODIUM 500 MG/5ML IV SOLN
INTRAVENOUS | Status: DC | PRN
  Administered 2019-10-10: 400 mg via INTRAVENOUS

## 2019-10-10 MED ORDER — FAMOTIDINE 20 MG PO TABS: 20.0000 mg | ORAL_TABLET | Freq: Once | ORAL | Status: AC

## 2019-10-10 MED ORDER — PROPOFOL 10 MG/ML IV BOLUS
INTRAVENOUS | Status: AC
  Filled 2019-10-10: qty 20

## 2019-10-10 MED ORDER — IPRATROPIUM-ALBUTEROL 0.5-2.5 (3) MG/3ML IN SOLN
3.0000 mL | Freq: Once | RESPIRATORY_TRACT | Status: AC
  Administered 2019-10-10: 3 mL via RESPIRATORY_TRACT

## 2019-10-10 MED ORDER — SODIUM CHLORIDE (PF) 0.9 % IJ SOLN
INTRAMUSCULAR | Status: AC
  Filled 2019-10-10: qty 10

## 2019-10-10 MED ORDER — ROCURONIUM BROMIDE 50 MG/5ML IV SOLN
INTRAVENOUS | Status: AC
  Filled 2019-10-10: qty 1

## 2019-10-10 MED ORDER — ONDANSETRON HCL 4 MG/2ML IJ SOLN
INTRAMUSCULAR | Status: AC
  Filled 2019-10-10: qty 2

## 2019-10-10 MED ORDER — KETAMINE HCL 50 MG/ML IJ SOLN
INTRAMUSCULAR | Status: AC
  Filled 2019-10-10: qty 10

## 2019-10-10 MED ORDER — SUGAMMADEX SODIUM 200 MG/2ML IV SOLN
INTRAVENOUS | Status: AC
  Filled 2019-10-10: qty 2

## 2019-10-10 MED ORDER — IPRATROPIUM-ALBUTEROL 0.5-2.5 (3) MG/3ML IN SOLN
RESPIRATORY_TRACT | Status: AC
  Filled 2019-10-10: qty 3

## 2019-10-10 MED ORDER — KETAMINE HCL 10 MG/ML IJ SOLN
INTRAMUSCULAR | Status: DC | PRN
  Administered 2019-10-10: 30 mg via INTRAVENOUS

## 2019-10-10 MED ORDER — ROCURONIUM BROMIDE 100 MG/10ML IV SOLN
INTRAVENOUS | Status: DC | PRN
  Administered 2019-10-10: 50 mg via INTRAVENOUS

## 2019-10-10 MED ORDER — FAMOTIDINE 20 MG PO TABS
ORAL_TABLET | ORAL | Status: AC
  Administered 2019-10-10: 20 mg via ORAL
  Filled 2019-10-10: qty 1

## 2019-10-10 MED ORDER — BUTAMBEN-TETRACAINE-BENZOCAINE 2-2-14 % EX AERO
1.0000 | INHALATION_SPRAY | Freq: Once | CUTANEOUS | Status: DC
  Filled 2019-10-10: qty 5

## 2019-10-10 MED ORDER — PROPOFOL 10 MG/ML IV BOLUS
INTRAVENOUS | Status: DC | PRN
  Administered 2019-10-10: 150 mg via INTRAVENOUS

## 2019-10-10 MED ORDER — PHENYLEPHRINE HCL (PRESSORS) 10 MG/ML IV SOLN
INTRAVENOUS | Status: DC | PRN
  Administered 2019-10-10 (×2): 100 ug via INTRAVENOUS
  Administered 2019-10-10: 50 ug via INTRAVENOUS

## 2019-10-10 MED ORDER — EPHEDRINE SULFATE 50 MG/ML IJ SOLN
INTRAMUSCULAR | Status: DC | PRN
  Administered 2019-10-10 (×2): 10 mg via INTRAVENOUS

## 2019-10-10 MED ORDER — DEXAMETHASONE SODIUM PHOSPHATE 10 MG/ML IJ SOLN
INTRAMUSCULAR | Status: DC | PRN
  Administered 2019-10-10: 10 mg via INTRAVENOUS

## 2019-10-10 MED ORDER — SODIUM CHLORIDE 0.9 % IV SOLN
INTRAVENOUS | Status: DC | PRN
  Administered 2019-10-10: 20 ug/min via INTRAVENOUS

## 2019-10-10 MED ORDER — PHENYLEPHRINE HCL (PRESSORS) 10 MG/ML IV SOLN
INTRAVENOUS | Status: AC
  Filled 2019-10-10: qty 1

## 2019-10-10 MED ORDER — ONDANSETRON HCL 4 MG/2ML IJ SOLN
INTRAMUSCULAR | Status: DC | PRN
  Administered 2019-10-10: 4 mg via INTRAVENOUS

## 2019-10-10 MED ORDER — SUCCINYLCHOLINE CHLORIDE 20 MG/ML IJ SOLN
INTRAMUSCULAR | Status: AC
  Filled 2019-10-10: qty 1

## 2019-10-10 MED ORDER — FENTANYL CITRATE (PF) 100 MCG/2ML IJ SOLN
INTRAMUSCULAR | Status: AC
  Filled 2019-10-10: qty 2

## 2019-10-10 MED ORDER — MAGIC MOUTHWASH W/LIDOCAINE: 5.0000 mL | Freq: Four times a day (QID) | ORAL | 1 refills | Status: DC | PRN

## 2019-10-10 MED ORDER — EPHEDRINE SULFATE 50 MG/ML IJ SOLN
INTRAMUSCULAR | Status: AC
  Filled 2019-10-10: qty 1

## 2019-10-10 MED ORDER — SODIUM CHLORIDE 0.9 % IV SOLN
Freq: Once | INTRAVENOUS | Status: AC
  Administered 2019-10-10: 15 mL/h via INTRAVENOUS

## 2019-10-10 MED ORDER — FENTANYL CITRATE (PF) 100 MCG/2ML IJ SOLN: 25.0000 ug | INTRAMUSCULAR | Status: DC | PRN

## 2019-10-10 MED ORDER — ONDANSETRON HCL 4 MG/2ML IJ SOLN: 4.0000 mg | Freq: Once | INTRAMUSCULAR | Status: DC | PRN

## 2019-10-10 MED ORDER — DEXAMETHASONE SODIUM PHOSPHATE 10 MG/ML IJ SOLN
INTRAMUSCULAR | Status: AC
  Filled 2019-10-10: qty 1

## 2019-10-10 MED ORDER — LIDOCAINE HCL (CARDIAC) PF 100 MG/5ML IV SOSY
PREFILLED_SYRINGE | INTRAVENOUS | Status: DC | PRN
  Administered 2019-10-10: 100 mg via INTRAVENOUS

## 2019-10-10 MED ORDER — FENTANYL CITRATE (PF) 100 MCG/2ML IJ SOLN
INTRAMUSCULAR | Status: DC | PRN
  Administered 2019-10-10 (×4): 25 ug via INTRAVENOUS

## 2019-10-10 MED ORDER — SEVOFLURANE IN SOLN
RESPIRATORY_TRACT | Status: AC
  Filled 2019-10-10: qty 250

## 2019-10-10 MED ORDER — LIDOCAINE HCL (PF) 2 % IJ SOLN
INTRAMUSCULAR | Status: AC
  Filled 2019-10-10: qty 10

## 2019-10-10 MED ORDER — SUGAMMADEX SODIUM 500 MG/5ML IV SOLN
INTRAVENOUS | Status: AC
  Filled 2019-10-10: qty 5

## 2019-10-10 NOTE — Op Note (Signed)
PROCEDURE: ENDOBRONCHIAL ULTRASOUND   PROCEDURE DATE: 10/10/2019  TIME:  NAME:  Patrick Mendoza.  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  Rule out metastatic disease versus secondary primary  Consent: (Place X beside choice/s below)  The benefits, risks and possible complications of the procedure were        explained to:  _X__ patient  ___ patient's family  ___ other:___________  who verbalized understanding and gave:  ___ verbal  ___ written  _X__ verbal and written  ___ telephone  ___ other:________ consent.      Unable to obtain consent; procedure performed on emergent basis.   X  Other: Benefits, limitations and potential complications of the procedure were discussed with the patient/family  including, but not limited to bleeding, hemoptysis, respiratory failure requiring intubation and/or prolongued mechanical ventilation, infection, pneumothorax (collapse of lung) requiring chest tube placement, stroke from air embolism or even death.  Patient agrees to proceed   Operator: Renold Don, MD Assistant/scrub: Annia Belt, RT Anesthesia: Floyce Stakes, CRNA and Martha Clan, MD ROSE available: Yes, [facility]  ANESTHESIA: GENERAL   Insertion Route (Place X beside choice below)   Nasal   Oral  X Endotracheal Tube 8.5 ETT   Tracheostomy   INTRAPROCEDURE MEDICATIONS: SEE ANESTHESIOLOGY RECORDS   PROCEDURE DETAILS: Patient was taken to Procedure Room 2 (Bronchoscopy Suite) where appropriate timeout was performed with the staff.  Timeout performed and correct patient, name, & ID confirmed.  Laterality was: N/A (central).  Patient was inducted under general anesthesia by CRNA/anesthesiologist and intubated with an 8.5 ET tube.  A Portex adapter was placed on the flange  of the ET tube.  Once the patient had achieved appropriate general anesthesia the Olympus video bronchoscope was advanced through the existing Portex adapter.  An anatomic tour of the airway was then performed.  The visible portions of the trachea were normal.  Carina was sharp.  Examination of the right and left tracheobronchial trees showed no endobronchial lesions.  The patient had no secretions noted.  Mucosa was normal.  Once the anatomic tour was completed, the bronchoscope was pulled and it was replaced for an endobronchial ultrasound scope.  This was advanced into the airway via the Portex adapter.  Examination of the mediastinum and hilar structures was performed.  There was a 3 cm x 2 cm mass/lymph node on the subcarinal space that was more accessible via the LEFT side.  The mass/lymph node appeared highly necrotic.  An Olympus 21-gauge EBUS needle was then utilized to sample this structure.  ROSE examination showed lymphocytes and on the second pass, malignancy was identified.  At this point additional passes were performed and placed into CytoLyt for further pathology evaluation.  Once this was completed examination of the airway showed good hemostasis.  Total blood loss was less than 5 mL.  At this point the bronchoscope was retrieved and the procedure was terminated.  Patient was allowed to emerge from general anesthesia and was taken to the PACU in satisfactory condition.  No complications noted.  Patient tolerated procedure well.   SPECIMENS (Sites): (Place X beside choice below)  Specimens Description   No Specimens Obtained     Washings    Lavage    Biopsies    Fine Needle Aspirates  TBNA x 6, LEFT subcarinal  space   Brushings    Sputum    FINDINGS: 2 cm x 3 cm necrotic lymph node in the subcarinal space.  No endobronchial lesions.  ESTIMATED BLOOD LOSS: Less than 5 mL COMPLICATIONS/RESOLUTION: none   IMPRESSION:POST-PROCEDURE DX: Mediastinal (subcarinal) lymphadenopathy with  evidence of metastatic carcinoma Recently diagnosed primary cancer of the palatine tonsil   RECOMMENDATION/PLAN:  Patient was provided post bronchoscopy instructions Await pathology reports We will notify the patient when pathology reports are available Follow-up [place] [facility] 8 March at 11:30 AM   C. Derrill Kay, MD [facility]  *This note was dictated using voice recognition software/Dragon.  Despite best efforts to proofread, errors can occur which can change the meaning.  Any change was purely unintentional.

## 2019-10-10 NOTE — Anesthesia Postprocedure Evaluation (Signed)
Anesthesia Post Note  Patient: Danil Wedge.  Procedure(s) Performed: VIDEO BRONCHOSCOPY WITH ENDOBRONCHIAL ULTRASOUND (N/A ) VIDEO BRONCHOSCOPY WITH ENDOBRONCHIAL NAVIGATION (N/A )  Patient location during evaluation: PACU Anesthesia Type: General Level of consciousness: awake and alert Pain management: pain level controlled Vital Signs Assessment: post-procedure vital signs reviewed and stable Respiratory status: spontaneous breathing, nonlabored ventilation, respiratory function stable and patient connected to nasal cannula oxygen Cardiovascular status: blood pressure returned to baseline and stable Postop Assessment: no apparent nausea or vomiting Anesthetic complications: no     Last Vitals:  Vitals:   10/10/19 0921 10/10/19 0929  BP: 117/71 123/73  Pulse: 84 82  Resp: 17 17  Temp:  36.9 C  SpO2: 91% 95%    Last Pain:  Vitals:   10/10/19 0929  TempSrc: Tympanic  PainSc:                  Martha Clan

## 2019-10-10 NOTE — Progress Notes (Signed)
Sonia Baller asked me to call in MMW for this pt. I called [facility] at [address] and they have a formulary for it. I called in MMW with lidocaine with 320 ml and 5 mg qid as needed swish and swallow and 1 refill to pharmacist.

## 2019-10-10 NOTE — Interval H&P Note (Signed)
History and Physical Interval Note:  10/10/2019 7:23 AM  Patrick Mendoza.  has presented today for surgery, with the diagnosis of MEDIASTINAL AEOPATHY, RIGHT LOWER LOBE NODULE.  The various methods of treatment have been discussed with the patient and family. After consideration of risks, benefits and other options for treatment, the patient has consented to  Procedure(s): VIDEO BRONCHOSCOPY WITH ENDOBRONCHIAL ULTRASOUND (N/A) VIDEO BRONCHOSCOPY WITH ENDOBRONCHIAL NAVIGATION (N/A) as a surgical intervention.  The patient's history has been reviewed, patient examined, no change in status, stable for surgery.  I have reviewed the patient's chart and labs.  Questions were answered to the patient's satisfaction.    Review of CT shows that EBUS is best option. No navigation to be performed today.   Vernard Gambles

## 2019-10-10 NOTE — Progress Notes (Signed)
Patient stated that he had been having some mouth sores.

## 2019-10-10 NOTE — Discharge Instructions (Signed)

## 2019-10-10 NOTE — Anesthesia Preprocedure Evaluation (Signed)
Anesthesia Evaluation  Patient identified by MRN, date of birth, ID band Patient awake    Reviewed: Allergy & Precautions, H&P , NPO status , Patient's Chart, lab work & pertinent test results, reviewed documented beta blocker date and time   History of Anesthesia Complications Negative for: history of anesthetic complications  Airway Mallampati: IV  TM Distance: >3 FB Neck ROM: full    Dental  (+) Dental Advidsory Given, Teeth Intact, Missing, Chipped, Loose   Pulmonary shortness of breath, asthma , neg sleep apnea, neg recent URI,    Pulmonary exam normal        Cardiovascular Exercise Tolerance: Good negative cardio ROS Normal cardiovascular exam     Neuro/Psych negative neurological ROS  negative psych ROS   GI/Hepatic negative GI ROS, Neg liver ROS,   Endo/Other  negative endocrine ROS  Renal/GU negative Renal ROS  negative genitourinary   Musculoskeletal   Abdominal   Peds  Hematology negative hematology ROS (+)   Anesthesia Other Findings Past Medical History: No date: Asthma No date: Lung nodules No date: Tonsil cancer ([place])   Reproductive/Obstetrics negative OB ROS                             Anesthesia Physical Anesthesia Plan  ASA: II  Anesthesia Plan: General   Post-op Pain Management:    Induction: Intravenous  PONV Risk Score and Plan: 2 and Ondansetron, Dexamethasone, Treatment may vary due to age or medical condition and Midazolam  Airway Management Planned: Oral ETT  Additional Equipment:   Intra-op Plan:   Post-operative Plan: Extubation in OR  Informed Consent: I have reviewed the patients History and Physical, chart, labs and discussed the procedure including the risks, benefits and alternatives for the proposed anesthesia with the patient or authorized representative who has indicated his/her understanding and acceptance.     Dental Advisory  Given  Plan Discussed with: Anesthesiologist, CRNA and Surgeon  Anesthesia Plan Comments:         Anesthesia Quick Evaluation

## 2019-10-10 NOTE — Anesthesia Procedure Notes (Signed)
Procedure Name: Intubation Date/Time: 10/10/2019 7:45 AM Performed by: Lia Foyer, CRNA Pre-anesthesia Checklist: Patient identified, Emergency Drugs available, Suction available and Patient being monitored Patient Re-evaluated:Patient Re-evaluated prior to induction Oxygen Delivery Method: Circle system utilized Preoxygenation: Pre-oxygenation with 100% oxygen Induction Type: IV induction Ventilation: Mask ventilation without difficulty Laryngoscope Size: McGraph and 4 Grade View: Grade I Tube type: Oral Tube size: 8.5 mm Number of attempts: 1 Airway Equipment and Method: Stylet and Oral airway Placement Confirmation: ETT inserted through vocal cords under direct vision,  positive ETCO2 and breath sounds checked- equal and bilateral Secured at: 22 cm Tube secured with: Tape Dental Injury: Teeth and Oropharynx as per pre-operative assessment

## 2019-10-10 NOTE — Transfer of Care (Signed)
Immediate Anesthesia Transfer of Care Note  Patient: Patrick Mendoza.  Procedure(s) Performed: VIDEO BRONCHOSCOPY WITH ENDOBRONCHIAL ULTRASOUND (N/A ) VIDEO BRONCHOSCOPY WITH ENDOBRONCHIAL NAVIGATION (N/A )  Patient Location: PACU  Anesthesia Type:General  Level of Consciousness: drowsy and patient cooperative  Airway & Oxygen Therapy: Patient Spontanous Breathing and Patient connected to face mask oxygen  Post-op Assessment: Report given to RN and Post -op Vital signs reviewed and stable  Post vital signs: Reviewed and stable  Last Vitals:  Vitals Value Taken Time  BP 123/74 10/10/19 0842  Temp    Pulse 83 10/10/19 0849  Resp 13 10/10/19 0849  SpO2 100 % 10/10/19 0849  Vitals shown include unvalidated device data.  Last Pain:  Vitals:   10/10/19 0622  TempSrc: Oral  PainSc: 6          Complications: No apparent anesthesia complications

## 2019-10-13 ENCOUNTER — Other Ambulatory Visit: Payer: Self-pay

## 2019-10-13 ENCOUNTER — Inpatient Hospital Stay: Payer: Medicare HMO

## 2019-10-13 ENCOUNTER — Inpatient Hospital Stay: Payer: Medicare HMO | Admitting: Oncology

## 2019-10-13 VITALS — BP 157/85 | HR 62 | Temp 97.3°F | Ht 70.0 in | Wt 220.0 lb

## 2019-10-13 VITALS — BP 146/88 | HR 64 | Resp 18

## 2019-10-13 DIAGNOSIS — C099 Malignant neoplasm of tonsil, unspecified: Secondary | ICD-10-CM

## 2019-10-13 DIAGNOSIS — Z5111 Encounter for antineoplastic chemotherapy: Secondary | ICD-10-CM | POA: Diagnosis not present

## 2019-10-13 DIAGNOSIS — Z5112 Encounter for antineoplastic immunotherapy: Secondary | ICD-10-CM

## 2019-10-13 DIAGNOSIS — E876 Hypokalemia: Secondary | ICD-10-CM | POA: Diagnosis not present

## 2019-10-13 DIAGNOSIS — G893 Neoplasm related pain (acute) (chronic): Secondary | ICD-10-CM

## 2019-10-13 LAB — COMPREHENSIVE METABOLIC PANEL
ALT: 16 U/L (ref 0–44)
AST: 25 U/L (ref 15–41)
Albumin: 4 g/dL (ref 3.5–5.0)
Alkaline Phosphatase: 53 U/L (ref 38–126)
Anion gap: 10 (ref 5–15)
BUN: 19 mg/dL (ref 8–23)
CO2: 27 mmol/L (ref 22–32)
Calcium: 8.9 mg/dL (ref 8.9–10.3)
Chloride: 102 mmol/L (ref 98–111)
Creatinine, Ser: 0.78 mg/dL (ref 0.61–1.24)
GFR calc Af Amer: >60 mL/min (ref >60)
GFR calc non Af Amer: >60 mL/min (ref >60)
Glucose, Bld: 108 mg/dL — ABNORMAL HIGH (ref 70–99)
Potassium: 3.1 mmol/L — ABNORMAL LOW (ref 3.5–5.1)
Sodium: 139 mmol/L (ref 135–145)
Total Bilirubin: 0.7 mg/dL (ref 0.3–1.2)
Total Protein: 7.1 g/dL (ref 6.5–8.1)

## 2019-10-13 LAB — CBC WITH DIFFERENTIAL/PLATELET
Abs Immature Granulocytes: 0.02 10*3/uL (ref 0.00–0.07)
Basophils Absolute: 0 10*3/uL (ref 0.0–0.1)
Basophils Relative: 1 %
Eosinophils Absolute: 0.3 10*3/uL (ref 0.0–0.5)
Eosinophils Relative: 4 %
HCT: 40.1 % (ref 39.0–52.0)
Hemoglobin: 13.3 g/dL (ref 13.0–17.0)
Immature Granulocytes: 0 %
Lymphocytes Relative: 16 %
Lymphs Abs: 1.3 10*3/uL (ref 0.7–4.0)
MCH: 30.4 pg (ref 26.0–34.0)
MCHC: 33.2 g/dL (ref 30.0–36.0)
MCV: 91.6 fL (ref 80.0–100.0)
Monocytes Absolute: 0.7 10*3/uL (ref 0.1–1.0)
Monocytes Relative: 9 %
Neutro Abs: 5.5 10*3/uL (ref 1.7–7.7)
Neutrophils Relative %: 70 %
Platelets: 313 10*3/uL (ref 150–400)
RBC: 4.38 MIL/uL (ref 4.22–5.81)
RDW: 12.9 % (ref 11.5–15.5)
WBC: 7.8 10*3/uL (ref 4.0–10.5)
nRBC: 0 % (ref 0.0–0.2)

## 2019-10-13 LAB — TSH: TSH: 2.775 u[IU]/mL (ref 0.350–4.500)

## 2019-10-13 MED ORDER — POTASSIUM CHLORIDE CRYS ER 20 MEQ PO TBCR: 20.0000 meq | EXTENDED_RELEASE_TABLET | Freq: Every day | ORAL | 0 refills | Status: DC

## 2019-10-13 MED ORDER — SODIUM CHLORIDE 0.9 % IV SOLN
175.0000 mg/m2 | Freq: Once | INTRAVENOUS | Status: AC
  Administered 2019-10-13: 390 mg via INTRAVENOUS
  Filled 2019-10-13: qty 65

## 2019-10-13 MED ORDER — SODIUM CHLORIDE 0.9 % IV SOLN
641.5000 mg | Freq: Once | INTRAVENOUS | Status: AC
  Administered 2019-10-13: 640 mg via INTRAVENOUS
  Filled 2019-10-13: qty 64

## 2019-10-13 MED ORDER — SODIUM CHLORIDE 0.9% FLUSH
10.0000 mL | INTRAVENOUS | Status: DC | PRN
  Administered 2019-10-13: 10 mL via INTRAVENOUS
  Filled 2019-10-13: qty 10

## 2019-10-13 MED ORDER — HEPARIN SOD (PORK) LOCK FLUSH 100 UNIT/ML IV SOLN
INTRAVENOUS | Status: AC
  Filled 2019-10-13: qty 5

## 2019-10-13 MED ORDER — FAMOTIDINE IN NACL 20-0.9 MG/50ML-% IV SOLN
20.0000 mg | Freq: Once | INTRAVENOUS | Status: AC
  Administered 2019-10-13: 20 mg via INTRAVENOUS
  Filled 2019-10-13: qty 50

## 2019-10-13 MED ORDER — PALONOSETRON HCL INJECTION 0.25 MG/5ML
0.2500 mg | Freq: Once | INTRAVENOUS | Status: AC
  Administered 2019-10-13: 0.25 mg via INTRAVENOUS
  Filled 2019-10-13: qty 5

## 2019-10-13 MED ORDER — DIPHENHYDRAMINE HCL 50 MG/ML IJ SOLN
50.0000 mg | Freq: Once | INTRAMUSCULAR | Status: AC
  Administered 2019-10-13: 50 mg via INTRAVENOUS
  Filled 2019-10-13: qty 1

## 2019-10-13 MED ORDER — OXYCODONE HCL 5 MG PO TABS: 5.0000 mg | ORAL_TABLET | ORAL | 0 refills | Status: DC | PRN

## 2019-10-13 MED ORDER — SODIUM CHLORIDE 0.9 % IV SOLN
150.0000 mg | Freq: Once | INTRAVENOUS | Status: AC
  Administered 2019-10-13: 150 mg via INTRAVENOUS
  Filled 2019-10-13: qty 5

## 2019-10-13 MED ORDER — SODIUM CHLORIDE 0.9 % IV SOLN: 200.0000 mg/m2 | Freq: Once | INTRAVENOUS | Status: DC

## 2019-10-13 MED ORDER — SODIUM CHLORIDE 0.9 % IV SOLN
Freq: Once | INTRAVENOUS | Status: AC
  Filled 2019-10-13: qty 250

## 2019-10-13 MED ORDER — SODIUM CHLORIDE 0.9 % IV SOLN
10.0000 mg | Freq: Once | INTRAVENOUS | Status: AC
  Administered 2019-10-13: 10 mg via INTRAVENOUS
  Filled 2019-10-13: qty 1

## 2019-10-13 MED ORDER — HEPARIN SOD (PORK) LOCK FLUSH 100 UNIT/ML IV SOLN
500.0000 [IU] | Freq: Once | INTRAVENOUS | Status: AC
  Administered 2019-10-13: 500 [IU] via INTRAVENOUS
  Filled 2019-10-13: qty 5

## 2019-10-13 MED ORDER — SODIUM CHLORIDE 0.9 % IV SOLN
200.0000 mg | Freq: Once | INTRAVENOUS | Status: AC
  Administered 2019-10-13: 200 mg via INTRAVENOUS
  Filled 2019-10-13: qty 8

## 2019-10-13 NOTE — Progress Notes (Addendum)
Nutrition Assessment   Reason for Assessment: Referral from Dr. 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  Patient starting carboplatin, taxol and keytruda with possible concurrent chemo and radiation after 4 to 6 cycles.    Met with patient during infusion.  Patient reports that for the past month especially intake has been limited due to sore throat, difficulty swallowing and also unable to open mouth very wide.  Reports that he ate buttered biscuit yesterday for breakfast.  May have muffin.  Ate soup for supper last night.  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  Does not snack much during the day. Reports volume of food is less than typical intake.  Reports that he is drinking premier protein shakes.  Also using magic mouthwash with lidocaine to help with mouth pain.      Nutrition Focused Physical Exam: deferred   Medications: decadron, zofran, ativan, compazine   Labs: reviewed   Anthropometrics:   Height: 70 inches Weight: 220 lb today Reports 40 lb heavier 2 years ago.  Lost his wife in November 2018 which impacted weight loss BMI: 31  Noted 225 lb on 2/8.   Weight has been 220-225 since Jan 2021.    Estimated Energy Needs  Kcals: 2500-3000 Protein: 125-150 g Fluid: > 2.5 L   NUTRITION DIAGNOSIS: Inadequate oral intake related to carcinoma of tonsil as evidenced by sore throat, dysphagia, decreased intake.     INTERVENTION:  Reviewed the importance of nutrition during treatment.  Discussed soft, moist protein foods and handout provided.   Encouraged high calorie, high protein foods and handout provided.   Gave patient sample of higher calorie oral nutrition supplements along with coupons.  Patient would benefit from evaluation from [facility], [place] for rehab needs during treatment Patient has  contact information.   MONITORING, EVALUATION, GOAL: Patient will consume adequate calories and protein to maintain nutrition during treatment   Next Visit: phone Feb 25  Patrick Mendoza B. [name], [place], [place] Registered Dietitian [phone] (pager)

## 2019-10-13 NOTE — Progress Notes (Signed)
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 Richardson Landry for symptoms of sore throat and difficulty swallowing which she has been experiencing over the last 1 year.He underwent a comprehensive auto laryngoscopy exam which showed a firm enlarged right tonsillar mass with necrotic area extending onto the adjacent soft palate into the root of the uvula. Hypopharynx and larynx as well as nasopharynx was unable to be visualized due to gag reflex. 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  PET CT scan showed large hypermetabolic right palatine tonsil mass which crosses the midline.  Ipsilateral cervical nodal metastases.  Bilateral pulmonary nodules including a dominant 1 cm hypermetabolic right lung base nodule.  Multiplicity favor pulmonary metastases.  Subcarinal and equivocal left hilar nodal metastases.  Hypermetabolism involving the ascending colon.  Question mild diverticulitis versus underlying colonic mass or polyp cannot be excluded   Interval history-still reports having significant pain in his throat which makes it difficult for him to eat but he is keeping up with mainly pured food and some solid food.  Reports that the mouthwash has not been helping him as much.  ECOG PS- 1 Pain scale- 3 Opioid associated constipation-  no  Review of systems- Review of Systems  Constitutional: Negative for chills, fever, malaise/fatigue and weight loss.  HENT: Negative for congestion, ear discharge and nosebleeds.   Eyes: Negative for blurred vision.  Respiratory: Negative for cough, hemoptysis, sputum production, shortness of breath and wheezing.   Cardiovascular: Negative for chest pain, palpitations, orthopnea and claudication.  Gastrointestinal: Negative for abdominal pain, blood in stool, constipation, diarrhea, heartburn, melena, nausea and vomiting.  Genitourinary: Negative for dysuria, flank pain, frequency, hematuria and urgency.  Musculoskeletal: Negative for back pain, joint pain and myalgias.  Skin: Negative for rash.  Neurological: Negative for dizziness, tingling, focal weakness, seizures, weakness and headaches.  Endo/Heme/Allergies: Does not bruise/bleed easily.  Psychiatric/Behavioral: Negative for depression and suicidal ideas. The patient does not have insomnia.       No Known Allergies   Past Medical History:  Diagnosis Date  . Asthma   . Lung nodules   . Tonsil cancer [facility])      Past Surgical History:  Procedure Laterality Date  . CERVICAL SPINE SURGERY    . LUMBAR SPINE SURGERY    . PORTA CATH INSERTION N/A 10/08/2019   Procedure: PORTA CATH INSERTION;  Surgeon: Katha Cabal, MD;  Location: [place] [facility];  Service: Cardiovascular;  Laterality: N/A;  . VIDEO BRONCHOSCOPY WITH ENDOBRONCHIAL NAVIGATION N/A 10/10/2019   Procedure: VIDEO BRONCHOSCOPY WITH ENDOBRONCHIAL NAVIGATION;  Surgeon: Tyler Pita, MD;  Location: [facility];  Service: Pulmonary;  Laterality: N/A;  . VIDEO BRONCHOSCOPY WITH ENDOBRONCHIAL ULTRASOUND N/A 10/10/2019   Procedure: VIDEO BRONCHOSCOPY WITH ENDOBRONCHIAL ULTRASOUND;  Surgeon: Tyler Pita, MD;  Location: [facility];  Service: Pulmonary;  Laterality: N/A;    Social History  Socioeconomic History  . Marital status: Widowed    Spouse name:  Not on file  . Number of children: Not on file  . Years of education: Not on file  . Highest education level: Not on file  Occupational History  . Not on file  Tobacco Use  . Smoking status: Never Smoker  . Smokeless tobacco: Never Used  Substance and Sexual Activity  . Alcohol use: Never  . Drug use: Never  . Sexual activity: Not on file  Other Topics Concern  . Not on file  Social History Narrative  . Not on file   Social Determinants of Health   Financial Resource Strain:   . Difficulty of Paying Living Expenses: Not on file  Food Insecurity:   . Worried About Charity fundraiser in the Last Year: Not on file  . Ran Out of Food in the Last Year: Not on file  Transportation Needs:   . Lack of Transportation (Medical): Not on file  . Lack of Transportation (Non-Medical): Not on file  Physical Activity:   . Days of Exercise per Week: Not on file  . Minutes of Exercise per Session: Not on file  Stress:   . Feeling of Stress : Not on file  Social Connections:   . Frequency of Communication with Friends and Family: Not on file  . Frequency of Social Gatherings with Friends and Family: Not on file  . Attends Religious Services: Not on file  . Active Member of Clubs or Organizations: Not on file  . Attends Archivist Meetings: Not on file  . Marital Status: Not on file  Intimate Partner Violence:   . Fear of Current or Ex-Partner: Not on file  . Emotionally Abused: Not on file  . Physically Abused: Not on file  . Sexually Abused: Not on file    Family History  Problem Relation Age of Onset  . Cancer Mother 76       not sure what type  . Prostate cancer Father   . Rheum arthritis Father   . Healthy Sister   . Healthy Brother   . Healthy Sister   . Healthy Brother   . Healthy Brother   . Heart disease Brother   . Heart disease Brother      Current Outpatient Medications:  .  acetaminophen (TYLENOL) 500 MG tablet, Take 500-1,000 mg by mouth every 8  (eight) hours as needed (for pain.). , Disp: , Rfl:  .  albuterol (VENTOLIN HFA) 108 (90 Base) MCG/ACT inhaler, Inhale 2 puffs into the lungs every 6 (six) hours as needed (wheezing/shortness of breath). , Disp: , Rfl:  .  dexamethasone (DECADRON) 4 MG tablet, Take 2 tablets by mouth once a day for 3 days after chemo. Take with food., Disp: 30 tablet, Rfl: 1 .  lidocaine-prilocaine (EMLA) cream, Apply to affected area once, Disp: 30 g, Rfl: 3 .  LORazepam (ATIVAN) 0.5 MG tablet, Take 1 tablet (0.5 mg total) by mouth every 6 (six) hours as needed (Nausea or vomiting)., Disp: 30 tablet, Rfl: 0 .  magic mouthwash w/lidocaine SOLN, Take 5 mLs by mouth 4 (four) times daily as needed for mouth pain., Disp: 320 mL, Rfl: 1 .  ondansetron (ZOFRAN) 8 MG tablet, Take 1 tablet (8 mg total) by mouth 2 (two) times daily as needed. Start on the third day after chemotherapy., Disp: 30 tablet, Rfl: 1 .  prochlorperazine (COMPAZINE) 10 MG tablet, Take 1 tablet (10 mg total) by mouth  every 6 (six) hours as needed (Nausea or vomiting)., Disp: 30 tablet, Rfl: 1 .  SYMBICORT 160-4.5 MCG/ACT inhaler, Inhale 2 puffs into the lungs 2 (two) times daily., Disp: , Rfl:  .  oxyCODONE (OXY IR/ROXICODONE) 5 MG immediate release tablet, Take 1 tablet (5 mg total) by mouth every 4 (four) hours as needed for severe pain., Disp: 60 tablet, Rfl: 0 .  potassium chloride SA (KLOR-CON) 20 MEQ tablet, Take 1 tablet (20 mEq total) by mouth daily., Disp: 15 tablet, Rfl: 0 No current facility-administered medications for this visit.  Facility-Administered Medications Ordered in Other Visits:  .  CARBOplatin (PARAPLATIN) 640 mg in sodium chloride 0.9 % 250 mL chemo infusion, 640 mg, Intravenous, Once, Sindy Guadeloupe, MD .  heparin lock flush 100 unit/mL, 500 Units, Intravenous, Once, Sindy Guadeloupe, MD .  PACLitaxel (TAXOL) 390 mg in sodium chloride 0.9 % 500 mL chemo infusion (> 5m/m2), 175 mg/m2 (Treatment Plan Recorded), Intravenous,  Once, RSindy Guadeloupe MD, Last Rate: 188 mL/hr at 10/13/19 1400, Rate Verify at 10/13/19 1400 .  sodium chloride flush (NS) 0.9 % injection 10 mL, 10 mL, Intravenous, PRN, RSindy Guadeloupe MD, 10 mL at 10/13/19 0835  Physical exam:  Vitals:   10/13/19 0844  BP: (!) 157/85  Pulse: 62  Temp: (!) 97.3 F (36.3 C)  TempSrc: Tympanic  Weight: 220 lb (99.8 kg)  Height: _0  (1.778 m)   Physical Exam HENT:     Head: Normocephalic and atraumatic.  Eyes:     Pupils: Pupils are equal, round, and reactive to light.  Neck:     Comments: Palpable right-sided cervical adenopathy Cardiovascular:     Rate and Rhythm: Normal rate and regular rhythm.     Heart sounds: Normal heart sounds.  Pulmonary:     Effort: Pulmonary effort is normal.     Breath sounds: Normal breath sounds.  Abdominal:     General: Bowel sounds are normal.     Palpations: Abdomen is soft.  Musculoskeletal:     Cervical back: Normal range of motion.  Skin:    General: Skin is warm and dry.  Neurological:     Mental Status: He is alert and oriented to person, place, and time.      CMP Latest Ref Rng & Units 10/13/2019  Glucose 70 - 99 mg/dL 108(H)  BUN 8 - 23 mg/dL 19  Creatinine 0.61 - 1.24 mg/dL 0.78  Sodium 135 - 145 mmol/L 139  Potassium 3.5 - 5.1 mmol/L 3.1(L)  Chloride 98 - 111 mmol/L 102  CO2 22 - 32 mmol/L 27  Calcium 8.9 - 10.3 mg/dL 8.9  Total Protein 6.5 - 8.1 g/dL 7.1  Total Bilirubin 0.3 - 1.2 mg/dL 0.7  Alkaline Phos 38 - 126 U/L 53  AST 15 - 41 U/L 25  ALT 0 - 44 U/L 16   CBC Latest Ref Rng & Units 10/13/2019  WBC 4.0 - 10.5 K/uL 7.8  Hemoglobin 13.0 - 17.0 g/dL 13.3  Hematocrit 39.0 - 52.0 % 40.1  Platelets 150 - 400 K/uL 313    No images are attached to the encounter.  PERIPHERAL VASCULAR CATHETERIZATION  Result Date: 10/08/2019 See Op Note  NM PET Image Initial (PI) Skull Base To Thigh  Result Date: 09/30/2019 CLINICAL DATA:  Initial treatment strategy for pharyngeal mass.  Primary squamous cell carcinoma of palatine tonsil. EXAM: NUCLEAR MEDICINE PET SKULL BASE TO THIGH TECHNIQUE: 12.2 mCi F-18 FDG was injected intravenously. Full-ring PET imaging  was performed from the skull base to thigh after the radiotracer. CT data was obtained and used for attenuation correction and anatomic localization. Fasting blood glucose: 103 mg/dl COMPARISON:  None. FINDINGS: Mediastinal blood pool activity: SUV max 2.4 Liver activity: SUV max NA NECK: Right-sided palatine tonsil primary extends across the midline, impressing upon the airway. Measures 3.1 x 3.3 cm and a S.U.V. max of 14.0 on 25/3. Bulky right-sided level 2 nodes, including at 2.9 x 2.6 cm and a S.U.V. max of 11.8 on 34/3. Right-sided level 3 node measures 1.7 x 2.0 cm and a S.U.V. max of 3.5 on 44/3. Incidental CT findings: No left-sided cervical adenopathy. Right greater than left ethmoid air cell mucosal thickening with complete opacification of the imaged right frontal sinus. Clear mastoid air cells. CHEST: A right lung base (just along the right major fissure) nodule measures 1.0 cm and a S.U.V. max of 3.4 on 90/3. Subcarinal adenopathy at 2.9 x 1.8 cm and a S.U.V. max of 7.2 on 86/3. Hypermetabolism, possibly corresponding to a small node, about the medial aspect of the left hilum. This measures a S.U.V. max of 4.5 on 79/3. Incidental CT findings: Aortic atherosclerosis. Mild centrilobular emphysema. Right lower lobe 5 mm pulmonary nodule on 85/3. 7 mm left lower lobe pulmonary nodule on 105/3. Superior segment left lower lobe 7 mm nodule on 81/3. ABDOMEN/PELVIS: Hypermetabolism involves the ascending colon, just above the ileocecal junction. This corresponds to suggestion of wall thickening and possible pericolonic edema, including on 168/3. Measures a S.U.V. max of 9.3. Incidental CT findings: Normal adrenal glands. Left larger than right renal collecting system calculi including at up to 7 mm. Lower pole 2.9 cm right renal  low-density lesion is likely a cyst. Left larger than right fat containing inguinal hernias. The left-sided inguinal hernia contains a portion of the urinary bladder, including on 230/3. SKELETON: No abnormal marrow activity. Incidental CT findings: none IMPRESSION: 1. Large hypermetabolic right palatine tonsil mass which crosses the midline. Ipsilateral cervical nodal metastasis. 2. Bilateral pulmonary nodules, including a dominant 1.0 cm hypermetabolic right lung base nodule. Given multiplicity, favor pulmonary metastasis. 3. Subcarinal and equivocal left perihilar nodal metastasis, presumably from head neck primary. Less likely, dominant right lower lobe pulmonary nodule could represent a synchronous primary lung neoplasm. 4. Hypermetabolism with subtle soft tissue thickening involving the ascending colon. Question mild diverticulitis. Correlate with right-sided symptoms. Underlying colonic polyp or mass cannot be entirely excluded. 5. Portion of the urinary bladder and tree in a left inguinal hernia. 6. Incidental findings, including: Aortic atherosclerosis (ICD10-I70.0) and emphysema (ICD10-J43.9). Sinus disease. Bilateral nephrolithiasis. Electronically Signed   By: Abigail Miyamoto M.D.   On: 09/30/2019 14:47   CT Super D Chest Wo Contrast  Result Date: 10/07/2019 CLINICAL DATA:  Tonsillar cancer with recent PET scan showing hypermetabolic pulmonary nodules, scheduled for bronchoscopy. EXAM: CT CHEST WITHOUT CONTRAST TECHNIQUE: Multidetector CT imaging of the chest was performed using thin slice collimation for electromagnetic bronchoscopy planning purposes, without intravenous contrast. COMPARISON:  PET exam of 09/30/2019 FINDINGS: Cardiovascular: Minimal calcified atherosclerosis of the thoracic aorta. Heart size normal without pericardial effusion. Central pulmonary vasculature is unremarkable on noncontrast imaging, not well assessed. Mediastinum/Nodes: Low neck, right level 2 lymph nodes are partially  imaged, 1.6 cm area of soft tissue behind the right sternocleidomastoid muscle corresponding to this abnormality along its inferior margin better demonstrated on the recent PET exam. Bulky subcarinal lymphadenopathy largest at 2.4 cm short axis. Mildly enlarged left perihilar nodal tissue approximately 1.2  cm (image 29, series 2) No sign of right paratracheal lymphadenopathy or anterior mediastinal adenopathy. Lungs/Pleura: Scattered pulmonary nodules largest at the right lung base measuring 1 x 1 cm (image 34, series 3) this is immediately along the fissure in the anterior right lower lobe along the right hemidiaphragm. Small juxtapleural nodule in the left chest (image 44, series 3) 8 mm. Similarly sized nodule seen in the periphery of the superior segment of the left lower lobe. Small pulmonary nodule in the right upper lobe (image 24, series 3) 6 mm. No signs of pleural effusion. No dense consolidation. Also with small nodule in in the right lower lobe approximately 5 mm in the superior segment. Upper Abdomen: Lobular hepatic contours. Nonspecific finding. No signs of splenomegaly. Fatty replacement of much of pancreatic parenchyma. Adrenal glands are normal. No acute findings in the upper abdomen. Musculoskeletal: IMPRESSION: 1. Scattered pulmonary nodules, largest at the right lung base measuring 1 x 1 cm. Findings remain concerning for metastatic disease. 2. Bulky subcarinal and mild left perihilar adenopathy, also likely due to metastatic disease. 3. Low neck, right level 2 lymph nodes are partially imaged, better demonstrated on the recent PET exam. Aortic Atherosclerosis (ICD10-I70.0). Electronically Signed   By: Zetta Bills M.D.   On: 10/07/2019 16:38     Assessment and plan- Patient is a 69 y.o. 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  He is here for on treatment assessment prior to cycle 1 of carbotaxol Keytruda  chemotherapy  Patient was referred to pulmonary for EBUS guided biopsy of hilar lymph nodes which has been completed.  Pathology is currently pending.  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  He has been a lifetime non-smoker.  We will plan to check p16 on his hilar lymph node biopsy as well.  PD-L1 has been ordered on his original biopsy specimen and is currently pending  Combination chemoimmunotherapy is the standard of care for stage IVb metastatic head and neck cancer.  We will therefore proceed with cycle 1 of carboplatin AUC 5 along with Taxol 175 mg/m given IV every [redacted] weeks along with Keytruda 200 mg IV for up to 6 cycles followed by Keytruda monotherapy until progression or toxicity.  Patient has an excellent response to chemoimmunotherapy I will consider definitive consolidative radiation down the line.  Treatment is being given with a palliative intent.  Discussed risks and benefits of chemotherapy again including all but not limited to nausea, vomiting, low blood counts, risk of infections and hospitalizations.  Treatment is being given with a palliative intent.  Patient understands and agrees to proceed as planned.  He will be coming on day 3 for growth factor support.  Baseline TSH is normal  Hypokalemia: We will send him a prescription for oral potassium 20 mEq daily for 2 weeks.  I will see him back in 1 week's time to see how he tolerated his chemotherapy and repeat a BMP at that time.  We will also discuss pathology results with him at that time  Neoplasm related pain: Continue as needed oxycodone.  He just refilled his mouthwash 2 days ago and if it does not help him in a week's time I will consider switching him to an alternative topical agent.   patient was also noted to have hypermetabolism in his ascending  colon on PET CT scan.  I will discuss colonoscopy at his next visit    Visit Diagnosis 1. 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 Encounter for antineoplastic chemotherapy   3. Encounter for antineoplastic immunotherapy   4. Hypokalemia      Dr. Randa Evens, MD, MPH [facility] [phone] 10/13/2019 3:37 PM

## 2019-10-14 LAB — T4: T4, Total: 7.3 ug/dL (ref 4.5–12.0)

## 2019-10-15 ENCOUNTER — Other Ambulatory Visit: Payer: Medicare HMO

## 2019-10-15 ENCOUNTER — Telehealth: Payer: Self-pay

## 2019-10-15 ENCOUNTER — Inpatient Hospital Stay: Payer: Medicare HMO

## 2019-10-15 ENCOUNTER — Other Ambulatory Visit: Payer: Self-pay

## 2019-10-15 DIAGNOSIS — C099 Malignant neoplasm of tonsil, unspecified: Secondary | ICD-10-CM

## 2019-10-15 DIAGNOSIS — Z5111 Encounter for antineoplastic chemotherapy: Secondary | ICD-10-CM | POA: Diagnosis not present

## 2019-10-15 MED ORDER — PEGFILGRASTIM-JMDB 6 MG/0.6ML ~~LOC~~ SOSY
6.0000 mg | PREFILLED_SYRINGE | Freq: Once | SUBCUTANEOUS | Status: AC
  Administered 2019-10-15: 6 mg via SUBCUTANEOUS
  Filled 2019-10-15: qty 0.6

## 2019-10-15 NOTE — Telephone Encounter (Signed)
Telephone call to pt for follow up after receiving first chemo.   No answer but left message and instructed him to take Claritin one tablet by mouth for four days to decrease side effects of Neulasta.   Encouraged pt to call for any questions or concerns.

## 2019-10-20 ENCOUNTER — Other Ambulatory Visit: Payer: Self-pay

## 2019-10-20 ENCOUNTER — Inpatient Hospital Stay: Payer: Medicare HMO

## 2019-10-20 ENCOUNTER — Inpatient Hospital Stay: Payer: Medicare HMO | Admitting: Oncology

## 2019-10-20 ENCOUNTER — Encounter: Payer: Self-pay | Admitting: Oncology

## 2019-10-20 VITALS — BP 144/92 | HR 72 | Temp 97.1°F | Ht 70.0 in | Wt 217.0 lb

## 2019-10-20 DIAGNOSIS — Z5111 Encounter for antineoplastic chemotherapy: Secondary | ICD-10-CM | POA: Diagnosis not present

## 2019-10-20 DIAGNOSIS — G893 Neoplasm related pain (acute) (chronic): Secondary | ICD-10-CM

## 2019-10-20 DIAGNOSIS — C099 Malignant neoplasm of tonsil, unspecified: Secondary | ICD-10-CM

## 2019-10-20 LAB — BASIC METABOLIC PANEL
Anion gap: 9 (ref 5–15)
BUN: 21 mg/dL (ref 8–23)
CO2: 28 mmol/L (ref 22–32)
Calcium: 9.1 mg/dL (ref 8.9–10.3)
Chloride: 100 mmol/L (ref 98–111)
Creatinine, Ser: 1 mg/dL (ref 0.61–1.24)
GFR calc Af Amer: >60 mL/min (ref >60)
GFR calc non Af Amer: >60 mL/min (ref >60)
Glucose, Bld: 109 mg/dL — ABNORMAL HIGH (ref 70–99)
Potassium: 4.3 mmol/L (ref 3.5–5.1)
Sodium: 137 mmol/L (ref 135–145)

## 2019-10-20 NOTE — Progress Notes (Signed)
Patient stated that he had been having some pain and discomfort

## 2019-10-22 NOTE — Progress Notes (Signed)
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 Richardson Landry for symptoms of sore throat and difficulty swallowing which she has been experiencing over the last 1 year.He underwent a comprehensive auto laryngoscopy exam which showed a firm enlarged right tonsillar mass with necrotic area extending onto the adjacent soft palate into the root of the uvula. Hypopharynx and larynx as well as nasopharynx was unable to be visualized due to gag reflex. 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 PDL 1 70%  PET CT scan showed large hypermetabolic right palatine tonsil mass which crosses the midline. Ipsilateral cervical nodal metastases. Bilateral pulmonary nodules including a dominant 1 cm hypermetabolic right lung base nodule. Multiplicity favor pulmonary metastases. Subcarinal and equivocal left hilar nodal metastases. Hypermetabolism involving the ascending colon. Question mild diverticulitis versus underlying colonic mass or polyp cannot be excluded   Interval history- reports pain in his throat currently tolerable with oxycodone and magic mouthwash. Denies any nausea/ vomiting with chemotherapy  ECOG PS- 1 Pain scale- 4 Opioid associated constipation- no  Review of systems- Review of Systems  Constitutional: Negative for chills, fever,  malaise/fatigue and weight loss.  HENT: Negative for congestion, ear discharge and nosebleeds.   Eyes: Negative for blurred vision.  Respiratory: Negative for cough, hemoptysis, sputum production, shortness of breath and wheezing.   Cardiovascular: Negative for chest pain, palpitations, orthopnea and claudication.  Gastrointestinal: Negative for abdominal pain, blood in stool, constipation, diarrhea, heartburn, melena, nausea and vomiting.  Genitourinary: Negative for dysuria, flank pain, frequency, hematuria and urgency.  Musculoskeletal: Negative for back pain, joint pain and myalgias.  Skin: Negative for rash.  Neurological: Negative for dizziness, tingling, focal weakness, seizures, weakness and headaches.  Endo/Heme/Allergies: Does not bruise/bleed easily.  Psychiatric/Behavioral: Negative for depression and suicidal ideas. The patient does not have insomnia.       No Known Allergies   Past Medical History:  Diagnosis Date  . Asthma   . Lung nodules   . Tonsil cancer [place] [facility])      Past Surgical History:  Procedure Laterality Date  . CERVICAL SPINE SURGERY    . LUMBAR SPINE SURGERY    . PORTA CATH INSERTION N/A 10/08/2019   Procedure: PORTA CATH INSERTION;  Surgeon: Katha Cabal, MD;  Location: [facility];  Service: Cardiovascular;  Laterality: N/A;  . VIDEO BRONCHOSCOPY WITH ENDOBRONCHIAL NAVIGATION N/A 10/10/2019   Procedure: VIDEO BRONCHOSCOPY WITH ENDOBRONCHIAL NAVIGATION;  Surgeon: Tyler Pita, MD;  Location: [facility];  Service: Pulmonary;  Laterality: N/A;  . VIDEO BRONCHOSCOPY WITH ENDOBRONCHIAL ULTRASOUND N/A 10/10/2019   Procedure: VIDEO BRONCHOSCOPY WITH ENDOBRONCHIAL ULTRASOUND;  Surgeon: Tyler Pita, MD;  Location: [facility];  Service: Pulmonary;  Laterality: N/A;    Social History   Socioeconomic History  . Marital status: Widowed    Spouse name: Not on file  . Number of children: Not on file  . Years  of education: Not on file  .  Highest education level: Not on file  Occupational History  . Not on file  Tobacco Use  . Smoking status: Never Smoker  . Smokeless tobacco: Never Used  Substance and Sexual Activity  . Alcohol use: Never  . Drug use: Never  . Sexual activity: Not on file  Other Topics Concern  . Not on file  Social History Narrative  . Not on file   Social Determinants of Health   Financial Resource Strain:   . Difficulty of Paying Living Expenses: Not on file  Food Insecurity:   . Worried About Charity fundraiser in the Last Year: Not on file  . Ran Out of Food in the Last Year: Not on file  Transportation Needs:   . Lack of Transportation (Medical): Not on file  . Lack of Transportation (Non-Medical): Not on file  Physical Activity:   . Days of Exercise per Week: Not on file  . Minutes of Exercise per Session: Not on file  Stress:   . Feeling of Stress : Not on file  Social Connections:   . Frequency of Communication with Friends and Family: Not on file  . Frequency of Social Gatherings with Friends and Family: Not on file  . Attends Religious Services: Not on file  . Active Member of Clubs or Organizations: Not on file  . Attends Archivist Meetings: Not on file  . Marital Status: Not on file  Intimate Partner Violence:   . Fear of Current or Ex-Partner: Not on file  . Emotionally Abused: Not on file  . Physically Abused: Not on file  . Sexually Abused: Not on file    Family History  Problem Relation Age of Onset  . Cancer Mother 52       not sure what type  . Prostate cancer Father   . Rheum arthritis Father   . Healthy Sister   . Healthy Brother   . Healthy Sister   . Healthy Brother   . Healthy Brother   . Heart disease Brother   . Heart disease Brother      Current Outpatient Medications:  .  acetaminophen (TYLENOL) 500 MG tablet, Take 500-1,000 mg by mouth every 8 (eight) hours as needed (for pain.). , Disp: , Rfl:  .  albuterol (VENTOLIN HFA) 108 (90  Base) MCG/ACT inhaler, Inhale 2 puffs into the lungs every 6 (six) hours as needed (wheezing/shortness of breath). , Disp: , Rfl:  .  dexamethasone (DECADRON) 4 MG tablet, Take 2 tablets by mouth once a day for 3 days after chemo. Take with food., Disp: 30 tablet, Rfl: 1 .  lidocaine-prilocaine (EMLA) cream, Apply to affected area once, Disp: 30 g, Rfl: 3 .  LORazepam (ATIVAN) 0.5 MG tablet, Take 1 tablet (0.5 mg total) by mouth every 6 (six) hours as needed (Nausea or vomiting)., Disp: 30 tablet, Rfl: 0 .  magic mouthwash w/lidocaine SOLN, Take 5 mLs by mouth 4 (four) times daily as needed for mouth pain., Disp: 320 mL, Rfl: 1 .  ondansetron (ZOFRAN) 8 MG tablet, Take 1 tablet (8 mg total) by mouth 2 (two) times daily as needed. Start on the third day after chemotherapy., Disp: 30 tablet, Rfl: 1 .  potassium chloride SA (KLOR-CON) 20 MEQ tablet, Take 1 tablet (20 mEq total) by mouth daily., Disp: 15 tablet, Rfl: 0 .  prochlorperazine (COMPAZINE) 10 MG tablet, Take 1 tablet (10 mg total) by mouth every 6 (six)  hours as needed (Nausea or vomiting)., Disp: 30 tablet, Rfl: 1 .  SYMBICORT 160-4.5 MCG/ACT inhaler, Inhale 2 puffs into the lungs 2 (two) times daily., Disp: , Rfl:  .  oxyCODONE (OXY IR/ROXICODONE) 5 MG immediate release tablet, Take 1 tablet (5 mg total) by mouth every 4 (four) hours as needed for severe pain., Disp: 60 tablet, Rfl: 0  Physical exam:  Vitals:   10/20/19 1139  BP: (!) 144/92  Pulse: 72  Temp: (!) 97.1 F (36.2 C)  TempSrc: Tympanic  Weight: 217 lb (98.4 kg)  Height: _0  (1.778 m)   Physical Exam Constitutional:      General: He is not in acute distress. HENT:     Head: Normocephalic and atraumatic.  Eyes:     Pupils: Pupils are equal, round, and reactive to light.  Cardiovascular:     Rate and Rhythm: Normal rate and regular rhythm.     Heart sounds: Normal heart sounds.  Pulmonary:     Effort: Pulmonary effort is normal.     Breath sounds: Normal breath  sounds.  Abdominal:     General: Bowel sounds are normal.     Palpations: Abdomen is soft.  Musculoskeletal:     Cervical back: Normal range of motion.  Skin:    General: Skin is warm and dry.  Neurological:     Mental Status: He is alert and oriented to person, place, and time.      CMP Latest Ref Rng & Units 10/20/2019  Glucose 70 - 99 mg/dL 109(H)  BUN 8 - 23 mg/dL 21  Creatinine 0.61 - 1.24 mg/dL 1.00  Sodium 135 - 145 mmol/L 137  Potassium 3.5 - 5.1 mmol/L 4.3  Chloride 98 - 111 mmol/L 100  CO2 22 - 32 mmol/L 28  Calcium 8.9 - 10.3 mg/dL 9.1  Total Protein 6.5 - 8.1 g/dL -  Total Bilirubin 0.3 - 1.2 mg/dL -  Alkaline Phos 38 - 126 U/L -  AST 15 - 41 U/L -  ALT 0 - 44 U/L -   CBC Latest Ref Rng & Units 10/13/2019  WBC 4.0 - 10.5 K/uL 7.8  Hemoglobin 13.0 - 17.0 g/dL 13.3  Hematocrit 39.0 - 52.0 % 40.1  Platelets 150 - 400 K/uL 313    No images are attached to the encounter.  PERIPHERAL VASCULAR CATHETERIZATION  Result Date: 10/08/2019 See Op Note  NM PET Image Initial (PI) Skull Base To Thigh  Result Date: 09/30/2019 CLINICAL DATA:  Initial treatment strategy for pharyngeal mass. Primary squamous cell carcinoma of palatine tonsil. EXAM: NUCLEAR MEDICINE PET SKULL BASE TO THIGH TECHNIQUE: 12.2 mCi F-18 FDG was injected intravenously. Full-ring PET imaging was performed from the skull base to thigh after the radiotracer. CT data was obtained and used for attenuation correction and anatomic localization. Fasting blood glucose: 103 mg/dl COMPARISON:  None. FINDINGS: Mediastinal blood pool activity: SUV max 2.4 Liver activity: SUV max NA NECK: Right-sided palatine tonsil primary extends across the midline, impressing upon the airway. Measures 3.1 x 3.3 cm and a S.U.V. max of 14.0 on 25/3. Bulky right-sided level 2 nodes, including at 2.9 x 2.6 cm and a S.U.V. max of 11.8 on 34/3. Right-sided level 3 node measures 1.7 x 2.0 cm and a S.U.V. max of 3.5 on 44/3. Incidental CT  findings: No left-sided cervical adenopathy. Right greater than left ethmoid air cell mucosal thickening with complete opacification of the imaged right frontal sinus. Clear mastoid air cells. CHEST: A right lung base (  just along the right major fissure) nodule measures 1.0 cm and a S.U.V. max of 3.4 on 90/3. Subcarinal adenopathy at 2.9 x 1.8 cm and a S.U.V. max of 7.2 on 86/3. Hypermetabolism, possibly corresponding to a small node, about the medial aspect of the left hilum. This measures a S.U.V. max of 4.5 on 79/3. Incidental CT findings: Aortic atherosclerosis. Mild centrilobular emphysema. Right lower lobe 5 mm pulmonary nodule on 85/3. 7 mm left lower lobe pulmonary nodule on 105/3. Superior segment left lower lobe 7 mm nodule on 81/3. ABDOMEN/PELVIS: Hypermetabolism involves the ascending colon, just above the ileocecal junction. This corresponds to suggestion of wall thickening and possible pericolonic edema, including on 168/3. Measures a S.U.V. max of 9.3. Incidental CT findings: Normal adrenal glands. Left larger than right renal collecting system calculi including at up to 7 mm. Lower pole 2.9 cm right renal low-density lesion is likely a cyst. Left larger than right fat containing inguinal hernias. The left-sided inguinal hernia contains a portion of the urinary bladder, including on 230/3. SKELETON: No abnormal marrow activity. Incidental CT findings: none IMPRESSION: 1. Large hypermetabolic right palatine tonsil mass which crosses the midline. Ipsilateral cervical nodal metastasis. 2. Bilateral pulmonary nodules, including a dominant 1.0 cm hypermetabolic right lung base nodule. Given multiplicity, favor pulmonary metastasis. 3. Subcarinal and equivocal left perihilar nodal metastasis, presumably from head neck primary. Less likely, dominant right lower lobe pulmonary nodule could represent a synchronous primary lung neoplasm. 4. Hypermetabolism with subtle soft tissue thickening involving the  ascending colon. Question mild diverticulitis. Correlate with right-sided symptoms. Underlying colonic polyp or mass cannot be entirely excluded. 5. Portion of the urinary bladder and tree in a left inguinal hernia. 6. Incidental findings, including: Aortic atherosclerosis (ICD10-I70.0) and emphysema (ICD10-J43.9). Sinus disease. Bilateral nephrolithiasis. Electronically Signed   By: Abigail Miyamoto M.D.   On: 09/30/2019 14:47   CT Super D Chest Wo Contrast  Result Date: 10/07/2019 CLINICAL DATA:  Tonsillar cancer with recent PET scan showing hypermetabolic pulmonary nodules, scheduled for bronchoscopy. EXAM: CT CHEST WITHOUT CONTRAST TECHNIQUE: Multidetector CT imaging of the chest was performed using thin slice collimation for electromagnetic bronchoscopy planning purposes, without intravenous contrast. COMPARISON:  PET exam of 09/30/2019 FINDINGS: Cardiovascular: Minimal calcified atherosclerosis of the thoracic aorta. Heart size normal without pericardial effusion. Central pulmonary vasculature is unremarkable on noncontrast imaging, not well assessed. Mediastinum/Nodes: Low neck, right level 2 lymph nodes are partially imaged, 1.6 cm area of soft tissue behind the right sternocleidomastoid muscle corresponding to this abnormality along its inferior margin better demonstrated on the recent PET exam. Bulky subcarinal lymphadenopathy largest at 2.4 cm short axis. Mildly enlarged left perihilar nodal tissue approximately 1.2 cm (image 29, series 2) No sign of right paratracheal lymphadenopathy or anterior mediastinal adenopathy. Lungs/Pleura: Scattered pulmonary nodules largest at the right lung base measuring 1 x 1 cm (image 34, series 3) this is immediately along the fissure in the anterior right lower lobe along the right hemidiaphragm. Small juxtapleural nodule in the left chest (image 44, series 3) 8 mm. Similarly sized nodule seen in the periphery of the superior segment of the left lower lobe. Small  pulmonary nodule in the right upper lobe (image 24, series 3) 6 mm. No signs of pleural effusion. No dense consolidation. Also with small nodule in in the right lower lobe approximately 5 mm in the superior segment. Upper Abdomen: Lobular hepatic contours. Nonspecific finding. No signs of splenomegaly. Fatty replacement of much of pancreatic parenchyma. Adrenal glands are normal.  No acute findings in the upper abdomen. Musculoskeletal: IMPRESSION: 1. Scattered pulmonary nodules, largest at the right lung base measuring 1 x 1 cm. Findings remain concerning for metastatic disease. 2. Bulky subcarinal and mild left perihilar adenopathy, also likely due to metastatic disease. 3. Low neck, right level 2 lymph nodes are partially imaged, better demonstrated on the recent PET exam. Aortic Atherosclerosis (ICD10-I70.0). Electronically Signed   By: Zetta Bills M.D.   On: 10/07/2019 16:38     Assessment and plan- Patient is a 69 y.o. 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  He is here for toxicity check after cycle 1 of carbo/taxol/keytruda chemotherapy.  Patient has tolerated cycle 1 of chemotherapy well. PDL1 is 70% and he will likely respond well to [place]. Awaiting confirmation of mediastinal LN biopsy if it is also positive for HPV. HPV ISH is pending.   Neoplasm related pain- continue prn oxycodone and magic mouthwash  I will see him in 10 days for cycle 2 of carbo/taxol and keytruda   Visit Diagnosis 1. 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 Neoplasm related pain      Dr. Randa Evens, MD, MPH [facility] [phone] 10/22/2019 12:20 PM

## 2019-10-23 ENCOUNTER — Other Ambulatory Visit: Payer: Self-pay

## 2019-10-23 ENCOUNTER — Inpatient Hospital Stay: Payer: Medicare HMO

## 2019-10-23 NOTE — Progress Notes (Addendum)
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  Patient receiving carboplatin, taxol, and keytruda.    Spoke with patient via phone for nutrition follow-up.  Patient reports taste alterations.  Reports that food taste like cardboard.  Was able to taste some of potato soup last night with black pepper on it.  Did not like boost/ensure shakes as well as premier protein.  Does not drink premier protein everyday.  Has been trying to drink milkshakes, eating hamburgers and fish sandwiches.    Medications: reviewed  Labs: reviewed  Anthropometrics:   Weight 217 lb noted on 2/22 decreased from 220 lb on 2/15  Weight has been around 220-225 lb since Jan 2021   NUTRITION DIAGNOSIS: Inadequate oral intake stable    INTERVENTION:  Discussed strategies to help with taste change.   Encouraged patient to add premier protein shake daily. Patient may benefit from evaluation by Gwenette Greet, OT to determine if any rehab needs.  Patient has contact information    MONITORING, EVALUATION, GOAL: Patient will consume adequate calories and protein to maintain nutrition during treatment   NEXT VISIT: phone f/u March 8th  Toris Laverdiere B. [name], [place], [place] Registered Dietitian [phone] (pager)

## 2019-10-28 ENCOUNTER — Other Ambulatory Visit: Payer: Self-pay | Admitting: *Deleted

## 2019-10-28 DIAGNOSIS — C099 Malignant neoplasm of tonsil, unspecified: Secondary | ICD-10-CM

## 2019-10-28 LAB — CYTOLOGY - NON PAP

## 2019-10-28 MED ORDER — ONDANSETRON HCL 8 MG PO TABS: 8.0000 mg | ORAL_TABLET | Freq: Two times a day (BID) | ORAL | 1 refills | Status: DC | PRN

## 2019-10-28 MED ORDER — OXYCODONE HCL 5 MG PO TABS: 5.0000 mg | ORAL_TABLET | ORAL | 0 refills | Status: DC | PRN

## 2019-10-28 MED ORDER — PROCHLORPERAZINE MALEATE 10 MG PO TABS: 10.0000 mg | ORAL_TABLET | Freq: Four times a day (QID) | ORAL | 1 refills | Status: DC | PRN

## 2019-10-28 MED ORDER — LORAZEPAM 0.5 MG PO TABS: 0.5000 mg | ORAL_TABLET | Freq: Four times a day (QID) | ORAL | 0 refills | Status: DC | PRN

## 2019-10-28 MED ORDER — DEXAMETHASONE 4 MG PO TABS: ORAL_TABLET | ORAL | 1 refills | Status: DC

## 2019-10-28 MED ORDER — MAGIC MOUTHWASH W/LIDOCAINE: 5.0000 mL | Freq: Four times a day (QID) | ORAL | 1 refills | Status: DC | PRN

## 2019-10-28 MED ORDER — POTASSIUM CHLORIDE CRYS ER 20 MEQ PO TBCR: 20.0000 meq | EXTENDED_RELEASE_TABLET | Freq: Every day | ORAL | 0 refills | Status: DC

## 2019-10-29 ENCOUNTER — Telehealth: Payer: Self-pay | Admitting: Speech Pathology

## 2019-10-29 NOTE — Telephone Encounter (Signed)
Speech Therapy Note:  Reviewed chart after receiving referral. Contacted pt on 10/24/2019 and spoke w/ him regarding any concerns of dysphagia, swallowing issues. Per pt's request, ST services will f/u w/ pt at his next Infusion appt. on 11/04/2019 for further education.     Orinda Kenner, [place], CCC-SLP

## 2019-11-03 ENCOUNTER — Other Ambulatory Visit: Payer: Self-pay

## 2019-11-03 ENCOUNTER — Inpatient Hospital Stay: Payer: Medicare Other | Attending: Oncology

## 2019-11-03 ENCOUNTER — Encounter: Payer: Self-pay | Admitting: Pulmonary Disease

## 2019-11-03 ENCOUNTER — Encounter: Payer: Self-pay | Admitting: Oncology

## 2019-11-03 ENCOUNTER — Ambulatory Visit: Payer: Medicare HMO | Admitting: Pulmonary Disease

## 2019-11-03 VITALS — BP 134/80 | HR 75 | Temp 97.3°F | Ht 70.0 in | Wt 204.0 lb

## 2019-11-03 DIAGNOSIS — G893 Neoplasm related pain (acute) (chronic): Secondary | ICD-10-CM | POA: Insufficient documentation

## 2019-11-03 DIAGNOSIS — C099 Malignant neoplasm of tonsil, unspecified: Secondary | ICD-10-CM

## 2019-11-03 DIAGNOSIS — Z5111 Encounter for antineoplastic chemotherapy: Secondary | ICD-10-CM | POA: Insufficient documentation

## 2019-11-03 DIAGNOSIS — R1310 Dysphagia, unspecified: Secondary | ICD-10-CM | POA: Insufficient documentation

## 2019-11-03 DIAGNOSIS — R634 Abnormal weight loss: Secondary | ICD-10-CM | POA: Insufficient documentation

## 2019-11-03 DIAGNOSIS — J4520 Mild intermittent asthma, uncomplicated: Secondary | ICD-10-CM | POA: Diagnosis not present

## 2019-11-03 DIAGNOSIS — Z79899 Other long term (current) drug therapy: Secondary | ICD-10-CM | POA: Insufficient documentation

## 2019-11-03 DIAGNOSIS — R590 Localized enlarged lymph nodes: Secondary | ICD-10-CM

## 2019-11-03 DIAGNOSIS — C771 Secondary and unspecified malignant neoplasm of intrathoracic lymph nodes: Secondary | ICD-10-CM | POA: Insufficient documentation

## 2019-11-03 NOTE — Progress Notes (Signed)
Patient stated that his appetite is poor and would like magic mouthwash to be sent to his pharmacy. Patient also stated that he had been having trouble sleeping at nighttime and it has been getting worse.

## 2019-11-03 NOTE — Patient Instructions (Signed)
Continue using your inhalers as you are doing  3 months time, call sooner should any new difficulties arise

## 2019-11-03 NOTE — Progress Notes (Signed)
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  Patient receiving carboplatin, taxol, keytruda.  Spoke with patient via phone for nutrition follow-up.  Patient reports that nothing taste right.  Daughter fixed meatloaf, macaroni and cheese and green beans for dinner yesterday and tasted like cardboard.  Reports that is all he ate yesterday.  Has forgot about drinking premier protein shakes.  "I have some in my refrigerator."   Medications: reviewed  Labs: reviewed  Anthropometrics:   No new weight   NUTRITION DIAGNOSIS: Inadequate oral intake continues   INTERVENTION:  Encouraged patient to add premier protein shake at least daily.  Have previously discussed strategies to help with taste change. Patient has contact information    MONITORING, EVALUATION, GOAL: Patient will consume adequate calories and protein to maintain nutrition during treatment   NEXT VISIT: phone f/u April 1 (Thursday)  Lakita Sahlin B. [name], [place], [place] Registered Dietitian [phone] (pager)

## 2019-11-03 NOTE — Progress Notes (Signed)
   Subjective:    Patient ID: Patrick Mendoza., male    DOB: Mar 29, 1951, 69 y.o.   MRN: [id number]  HPI Patient is a 69 year old lifelong never smoker who presents for follow-up after bronchoscopy with EBUS for diagnosis of mediastinal adenopathyin the setting of recently diagnosed RIGHT palatine tonsil squamous cell carcinoma.Patient underwent the procedure on 10/10/2019.   It appears that this is related to metastatic disease.  Patient is a lifelong never smoker and do not believe that he has primary lung cancer.The patient has not had any fevers, chills or sweats.  No dyspnea over usual.  He does carry a diagnosis of asthma but is well controlled on Symbicort 160/4.5, 2 inhalations twice a day and as needed albuterol.  Does not endorse having any cough or sputum production.  No chest pain. No nausea vomiting, abdominal pain anorexia or weight loss.  Patient did not have any issues post procedure.   Review of Systems A 10 point review of systems was performed and it is as noted above otherwise negative.  No Known Allergies   Current medications reviewed with the patient.     Objective:   Physical Exam BP 134/80 (BP Location: Left Arm, Cuff Size: Normal)   Pulse 75   Temp (!) 97.3 F (36.3 C) (Temporal)   Ht 5\' 10"  (1.778 m)   Wt 204 lb (92.5 kg)   SpO2 96%   BMI 29.27 kg/m   GENERAL: Well-developed well-nourished gentleman in no acute distress.  Fully ambulatory. HEAD: Normocephalic, atraumatic.  EYES: Pupils equal, round, reactive to light.  No scleral icterus.  MOUTH: Nose/mouth/throat not examined due to masking requirements for COVID 19. NECK: Supple. No thyromegaly. Trachea midline. No JVD.  Right neck mass/cervical lymph node tender to touch. PULMONARY: Lungs clear to auscultation bilaterally.  Good air entry bilaterally. CARDIOVASCULAR: S1 and S2. Regular rate and rhythm.  No rubs, murmurs or gallops heard. GASTROINTESTINAL: No overt distention. MUSCULOSKELETAL: No joint  deformity, no clubbing, no edema.  NEUROLOGIC: No focal deficit noted.  Speech is fluent.  No gait disturbance noted. SKIN: Intact,warm,dry.  On limited exam no rashes. PSYCH: Mood and behavior appropriate.      Assessment & Plan:     ICD-10-CM   1. Mediastinal adenopathy  R59.0    Due to metastatic disease from cancer of the palatine tonsil Doubt primary lung cancer due to lifelong never smoker status Question HPV related cancer  2. 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)  C09.9    Management per oncology  3. Mild intermittent asthma without complication  Z60.10    Continue Symbicort and as needed albuterol   Discussion:  Patient is to continue inhalers for his mild intermittent asthma.  Continue management for cancer of the palatine tonsil as per oncology.  Follow-up with [place] in 3 months time call sooner should any other difficulties arise.  Renold Don, MD [facility]   *This note was dictated using voice recognition software/Dragon.  Despite best efforts to proofread, errors can occur which can change the meaning.  Any change was purely unintentional.

## 2019-11-04 ENCOUNTER — Ambulatory Visit: Payer: Medicare Other | Attending: Family Medicine | Admitting: Speech Pathology

## 2019-11-04 ENCOUNTER — Inpatient Hospital Stay: Payer: Medicare Other

## 2019-11-04 ENCOUNTER — Inpatient Hospital Stay (HOSPITAL_BASED_OUTPATIENT_CLINIC_OR_DEPARTMENT_OTHER): Payer: Medicare Other | Admitting: Oncology

## 2019-11-04 ENCOUNTER — Other Ambulatory Visit: Payer: Self-pay

## 2019-11-04 VITALS — BP 122/75 | HR 61 | Temp 95.9°F | Resp 16 | Wt 204.3 lb

## 2019-11-04 DIAGNOSIS — G893 Neoplasm related pain (acute) (chronic): Secondary | ICD-10-CM

## 2019-11-04 DIAGNOSIS — C099 Malignant neoplasm of tonsil, unspecified: Secondary | ICD-10-CM

## 2019-11-04 DIAGNOSIS — R634 Abnormal weight loss: Secondary | ICD-10-CM | POA: Diagnosis not present

## 2019-11-04 DIAGNOSIS — Z5112 Encounter for antineoplastic immunotherapy: Secondary | ICD-10-CM

## 2019-11-04 DIAGNOSIS — R1310 Dysphagia, unspecified: Secondary | ICD-10-CM | POA: Insufficient documentation

## 2019-11-04 DIAGNOSIS — Z5111 Encounter for antineoplastic chemotherapy: Secondary | ICD-10-CM

## 2019-11-04 DIAGNOSIS — C771 Secondary and unspecified malignant neoplasm of intrathoracic lymph nodes: Secondary | ICD-10-CM | POA: Diagnosis not present

## 2019-11-04 DIAGNOSIS — Z79899 Other long term (current) drug therapy: Secondary | ICD-10-CM | POA: Diagnosis not present

## 2019-11-04 LAB — COMPREHENSIVE METABOLIC PANEL
ALT: 17 U/L (ref 0–44)
AST: 17 U/L (ref 15–41)
Albumin: 3.8 g/dL (ref 3.5–5.0)
Alkaline Phosphatase: 69 U/L (ref 38–126)
Anion gap: 12 (ref 5–15)
BUN: 27 mg/dL — ABNORMAL HIGH (ref 8–23)
CO2: 24 mmol/L (ref 22–32)
Calcium: 8.9 mg/dL (ref 8.9–10.3)
Chloride: 99 mmol/L (ref 98–111)
Creatinine, Ser: 0.95 mg/dL (ref 0.61–1.24)
GFR calc Af Amer: >60 mL/min (ref >60)
GFR calc non Af Amer: >60 mL/min (ref >60)
Glucose, Bld: 117 mg/dL — ABNORMAL HIGH (ref 70–99)
Potassium: 4 mmol/L (ref 3.5–5.1)
Sodium: 135 mmol/L (ref 135–145)
Total Bilirubin: 0.8 mg/dL (ref 0.3–1.2)
Total Protein: 7.1 g/dL (ref 6.5–8.1)

## 2019-11-04 LAB — CBC WITH DIFFERENTIAL/PLATELET
Abs Immature Granulocytes: 0.08 10*3/uL — ABNORMAL HIGH (ref 0.00–0.07)
Basophils Absolute: 0 10*3/uL (ref 0.0–0.1)
Basophils Relative: 0 %
Eosinophils Absolute: 0 10*3/uL (ref 0.0–0.5)
Eosinophils Relative: 0 %
HCT: 42.6 % (ref 39.0–52.0)
Hemoglobin: 13.8 g/dL (ref 13.0–17.0)
Immature Granulocytes: 1 %
Lymphocytes Relative: 9 %
Lymphs Abs: 0.9 10*3/uL (ref 0.7–4.0)
MCH: 30.4 pg (ref 26.0–34.0)
MCHC: 32.4 g/dL (ref 30.0–36.0)
MCV: 93.8 fL (ref 80.0–100.0)
Monocytes Absolute: 0.6 10*3/uL (ref 0.1–1.0)
Monocytes Relative: 6 %
Neutro Abs: 8.7 10*3/uL — ABNORMAL HIGH (ref 1.7–7.7)
Neutrophils Relative %: 84 %
Platelets: 337 10*3/uL (ref 150–400)
RBC: 4.54 MIL/uL (ref 4.22–5.81)
RDW: 13.9 % (ref 11.5–15.5)
WBC: 10.3 10*3/uL (ref 4.0–10.5)
nRBC: 0 % (ref 0.0–0.2)

## 2019-11-04 MED ORDER — SODIUM CHLORIDE 0.9 % IV SOLN
175.0000 mg/m2 | Freq: Once | INTRAVENOUS | Status: AC
  Administered 2019-11-04: 390 mg via INTRAVENOUS
  Filled 2019-11-04: qty 65

## 2019-11-04 MED ORDER — PALONOSETRON HCL INJECTION 0.25 MG/5ML
0.2500 mg | Freq: Once | INTRAVENOUS | Status: AC
  Administered 2019-11-04: 0.25 mg via INTRAVENOUS
  Filled 2019-11-04: qty 5

## 2019-11-04 MED ORDER — HEPARIN SOD (PORK) LOCK FLUSH 100 UNIT/ML IV SOLN
500.0000 [IU] | Freq: Once | INTRAVENOUS | Status: AC
  Administered 2019-11-04: 500 [IU] via INTRAVENOUS
  Filled 2019-11-04: qty 5

## 2019-11-04 MED ORDER — SODIUM CHLORIDE 0.9 % IV SOLN
10.0000 mg | Freq: Once | INTRAVENOUS | Status: AC
  Administered 2019-11-04: 10 mg via INTRAVENOUS
  Filled 2019-11-04: qty 10

## 2019-11-04 MED ORDER — SODIUM CHLORIDE 0.9 % IV SOLN
150.0000 mg | Freq: Once | INTRAVENOUS | Status: AC
  Administered 2019-11-04: 150 mg via INTRAVENOUS
  Filled 2019-11-04: qty 150

## 2019-11-04 MED ORDER — SODIUM CHLORIDE 0.9 % IV SOLN
641.5000 mg | Freq: Once | INTRAVENOUS | Status: AC
  Administered 2019-11-04: 640 mg via INTRAVENOUS
  Filled 2019-11-04: qty 64

## 2019-11-04 MED ORDER — SODIUM CHLORIDE 0.9 % IV SOLN
200.0000 mg | Freq: Once | INTRAVENOUS | Status: AC
  Administered 2019-11-04: 200 mg via INTRAVENOUS
  Filled 2019-11-04: qty 8

## 2019-11-04 MED ORDER — SODIUM CHLORIDE 0.9 % IV SOLN
Freq: Once | INTRAVENOUS | Status: AC
  Filled 2019-11-04: qty 250

## 2019-11-04 MED ORDER — SODIUM CHLORIDE 0.9% FLUSH
10.0000 mL | INTRAVENOUS | Status: DC | PRN
  Filled 2019-11-04: qty 10

## 2019-11-04 MED ORDER — DIPHENHYDRAMINE HCL 50 MG/ML IJ SOLN
50.0000 mg | Freq: Once | INTRAMUSCULAR | Status: AC
  Administered 2019-11-04: 50 mg via INTRAVENOUS
  Filled 2019-11-04: qty 1

## 2019-11-04 MED ORDER — FAMOTIDINE IN NACL 20-0.9 MG/50ML-% IV SOLN
20.0000 mg | Freq: Once | INTRAVENOUS | Status: AC
  Administered 2019-11-04: 20 mg via INTRAVENOUS
  Filled 2019-11-04: qty 50

## 2019-11-04 MED ORDER — SODIUM CHLORIDE 0.9% FLUSH
10.0000 mL | INTRAVENOUS | Status: DC | PRN
  Administered 2019-11-04: 10 mL via INTRAVENOUS
  Filled 2019-11-04: qty 10

## 2019-11-04 MED ORDER — HEPARIN SOD (PORK) LOCK FLUSH 100 UNIT/ML IV SOLN
250.0000 [IU] | Freq: Once | INTRAVENOUS | Status: DC | PRN
  Filled 2019-11-04: qty 5

## 2019-11-04 MED ORDER — HEPARIN SOD (PORK) LOCK FLUSH 100 UNIT/ML IV SOLN
INTRAVENOUS | Status: AC
  Filled 2019-11-04: qty 5

## 2019-11-04 NOTE — Therapy (Signed)
[place] MAIN [facility] SERVICES [address] [place], [place], [address] Phone: [phone]   Fax:  [phone]  Speech Language Pathology Screen  Patient Details  Name: Patrick Mendoza. MRN: [id number] Date of Birth: Oct 10, 1950 No data recorded  Encounter Date: 11/04/2019  End of Session - 11/04/19 1650    Visit Number  0       Past Medical History:  Diagnosis Date  . Asthma   . Lung nodules   . Tonsil cancer [facility])     Past Surgical History:  Procedure Laterality Date  . CERVICAL SPINE SURGERY    . LUMBAR SPINE SURGERY    . PORTA CATH INSERTION N/A 10/08/2019   Procedure: PORTA CATH INSERTION;  Surgeon: Katha Cabal, MD;  Location: [place] [facility];  Service: Cardiovascular;  Laterality: N/A;  . VIDEO BRONCHOSCOPY WITH ENDOBRONCHIAL NAVIGATION N/A 10/10/2019   Procedure: VIDEO BRONCHOSCOPY WITH ENDOBRONCHIAL NAVIGATION;  Surgeon: Tyler Pita, MD;  Location: [facility];  Service: Pulmonary;  Laterality: N/A;  . VIDEO BRONCHOSCOPY WITH ENDOBRONCHIAL ULTRASOUND N/A 10/10/2019   Procedure: VIDEO BRONCHOSCOPY WITH ENDOBRONCHIAL ULTRASOUND;  Surgeon: Tyler Pita, MD;  Location: [facility];  Service: Pulmonary;  Laterality: N/A;    There were no vitals filed for this visit.       Speech Note: Pt seen today during his Infusion treatment session.                      Dysphagia, unspecified type    Problem List Patient Active Problem List   Diagnosis Date Noted  . Asthma 09/23/2019  . Goals of care, counseling/discussion 09/23/2019  . Primary squamous cell carcinoma of palatine tonsil ([place]) 09/23/2019    Watson,Katherine 11/04/2019, 4:51 PM  [place] MAIN [facility] SERVICES [address] [place], [place], [address] Phone: [phone]   Fax:  [phone]  Name: Patrick Mendoza. MRN: [id number] Date of Birth: 1951/07/09

## 2019-11-05 ENCOUNTER — Other Ambulatory Visit: Payer: Self-pay

## 2019-11-05 ENCOUNTER — Inpatient Hospital Stay: Payer: Medicare Other

## 2019-11-05 DIAGNOSIS — C099 Malignant neoplasm of tonsil, unspecified: Secondary | ICD-10-CM

## 2019-11-05 DIAGNOSIS — Z5111 Encounter for antineoplastic chemotherapy: Secondary | ICD-10-CM | POA: Diagnosis not present

## 2019-11-05 MED ORDER — PEGFILGRASTIM INJECTION 6 MG/0.6ML ~~LOC~~
6.0000 mg | PREFILLED_SYRINGE | Freq: Once | SUBCUTANEOUS | Status: AC
  Administered 2019-11-05: 6 mg via SUBCUTANEOUS
  Filled 2019-11-05: qty 0.6

## 2019-11-09 NOTE — Progress Notes (Signed)
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 Richardson Landry for symptoms of sore throat and difficulty swallowing which she has been experiencing over the last 1 year.He underwent a comprehensive auto laryngoscopy exam which showed a firm enlarged right tonsillar mass with necrotic area extending onto the adjacent soft palate into the root of the uvula. Hypopharynx and larynx as well as nasopharynx was unable to be visualized due to gag reflex. 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 PDL 1 70%  PET CT scan showed large hypermetabolic right palatine tonsil mass which crosses the midline. Ipsilateral cervical nodal metastases. Bilateral pulmonary nodules including a dominant 1 cm hypermetabolic right lung base nodule. Multiplicity favor pulmonary metastases. Subcarinal and equivocal left hilar nodal metastases. Hypermetabolism involving the ascending colon. Question mild diverticulitis versus underlying colonic mass or polyp cannot be excluded  Patient underwent EBUS guided biopsy of hilar lymph nodes.  Biopsy showed squamous cell carcinoma.  However HPV/p16 status was uninterpretable.   Interval history- He has lost 13 pounds over the last 3 weeks.  Still reports pain during swallowing but does not wish to change his pain  medications just yet.  Reports that treatment food does not taste good and he is trying to improve his oral intake.   ECOG PS- 1 Pain scale- 4   Review of systems- Review of Systems  Constitutional: Positive for malaise/fatigue. Negative for chills, fever and weight loss.  HENT: Negative for congestion, ear discharge and nosebleeds.        Pain during swallowing  Eyes: Negative for blurred vision.  Respiratory: Negative for cough, hemoptysis, sputum production, shortness of breath and wheezing.   Cardiovascular: Negative for chest pain, palpitations, orthopnea and claudication.  Gastrointestinal: Negative for abdominal pain, blood in stool, constipation, diarrhea, heartburn, melena, nausea and vomiting.  Genitourinary: Negative for dysuria, flank pain, frequency, hematuria and urgency.  Musculoskeletal: Negative for back pain, joint pain and myalgias.  Skin: Negative for rash.  Neurological: Negative for dizziness, tingling, focal weakness, seizures, weakness and headaches.  Endo/Heme/Allergies: Does not bruise/bleed easily.  Psychiatric/Behavioral: Negative for depression and suicidal ideas. The patient does not have insomnia.      No Known Allergies   Past Medical History:  Diagnosis Date  . Asthma   . Lung nodules   . Tonsil cancer [facility])      Past Surgical History:  Procedure Laterality Date  . CERVICAL SPINE SURGERY    . LUMBAR SPINE SURGERY    . PORTA CATH INSERTION N/A 10/08/2019   Procedure: PORTA CATH INSERTION;  Surgeon: Katha Cabal, MD;  Location: [place] [facility];  Service: Cardiovascular;  Laterality: N/A;  . VIDEO BRONCHOSCOPY WITH ENDOBRONCHIAL NAVIGATION N/A 10/10/2019   Procedure: VIDEO BRONCHOSCOPY WITH ENDOBRONCHIAL NAVIGATION;  Surgeon: Tyler Pita, MD;  Location: [facility] ORS;  Service: Pulmonary;  Laterality: N/A;  . VIDEO BRONCHOSCOPY WITH ENDOBRONCHIAL ULTRASOUND  N/A 10/10/2019   Procedure: VIDEO BRONCHOSCOPY WITH ENDOBRONCHIAL ULTRASOUND;   Surgeon: Tyler Pita, MD;  Location: [facility] ORS;  Service: Pulmonary;  Laterality: N/A;    Social History   Socioeconomic History  . Marital status: Widowed    Spouse name: Not on file  . Number of children: Not on file  . Years of education: Not on file  . Highest education level: Not on file  Occupational History  . Not on file  Tobacco Use  . Smoking status: Never Smoker  . Smokeless tobacco: Never Used  Substance and Sexual Activity  . Alcohol use: Never  . Drug use: Never  . Sexual activity: Not on file  Other Topics Concern  . Not on file  Social History Narrative  . Not on file   Social Determinants of Health   Financial Resource Strain:   . Difficulty of Paying Living Expenses:   Food Insecurity:   . Worried About Charity fundraiser in the Last Year:   . Arboriculturist in the Last Year:   Transportation Needs:   . Film/video editor (Medical):   Marland Kitchen Lack of Transportation (Non-Medical):   Physical Activity:   . Days of Exercise per Week:   . Minutes of Exercise per Session:   Stress:   . Feeling of Stress :   Social Connections:   . Frequency of Communication with Friends and Family:   . Frequency of Social Gatherings with Friends and Family:   . Attends Religious Services:   . Active Member of Clubs or Organizations:   . Attends Archivist Meetings:   Marland Kitchen Marital Status:   Intimate Partner Violence:   . Fear of Current or Ex-Partner:   . Emotionally Abused:   Marland Kitchen Physically Abused:   . Sexually Abused:     Family History  Problem Relation Age of Onset  . Cancer Mother 55       not sure what type  . Prostate cancer Father   . Rheum arthritis Father   . Healthy Sister   . Healthy Brother   . Healthy Sister   . Healthy Brother   . Healthy Brother   . Heart disease Brother   . Heart disease Brother      Current Outpatient Medications:  .  acetaminophen (TYLENOL) 500 MG tablet, Take 500-1,000 mg by mouth every 8 (eight) hours  as needed (for pain.). , Disp: , Rfl:  .  albuterol (VENTOLIN HFA) 108 (90 Base) MCG/ACT inhaler, Inhale 2 puffs into the lungs every 6 (six) hours as needed (wheezing/shortness of breath). , Disp: , Rfl:  .  dexamethasone (DECADRON) 4 MG tablet, Take 2 tablets by mouth once a day for 3 days after chemo. Take with food., Disp: 30 tablet, Rfl: 1 .  lidocaine-prilocaine (EMLA) cream, Apply to affected area once, Disp: 30 g, Rfl: 3 .  LORazepam (ATIVAN) 0.5 MG tablet, Take 1 tablet (0.5 mg total) by mouth every 6 (six) hours as needed (Nausea or vomiting)., Disp: 30 tablet, Rfl: 0 .  magic mouthwash w/lidocaine SOLN, Take 5 mLs by mouth 4 (four) times daily as needed for mouth pain., Disp: 320 mL, Rfl: 1 .  ondansetron (ZOFRAN) 8 MG tablet, Take 1 tablet (8 mg total) by mouth 2 (two) times daily as needed. Start on the third day after chemotherapy., Disp: 30 tablet, Rfl: 1 .  oxyCODONE (OXY IR/ROXICODONE) 5 MG immediate release tablet, Take 1 tablet (5 mg total) by mouth  every 4 (four) hours as needed for severe pain., Disp: 60 tablet, Rfl: 0 .  potassium chloride SA (KLOR-CON) 20 MEQ tablet, Take 1 tablet (20 mEq total) by mouth daily., Disp: 15 tablet, Rfl: 0 .  prochlorperazine (COMPAZINE) 10 MG tablet, Take 1 tablet (10 mg total) by mouth every 6 (six) hours as needed (Nausea or vomiting)., Disp: 30 tablet, Rfl: 1 .  SYMBICORT 160-4.5 MCG/ACT inhaler, Inhale 2 puffs into the lungs 2 (two) times daily., Disp: , Rfl:   Physical exam:  Vitals:   11/04/19 0830  BP: 122/75  Pulse: 61  Resp: 16  Temp: (!) 95.9 F (35.5 C)  TempSrc: Tympanic  Weight: 204 lb 4.8 oz (92.7 kg)   Physical Exam HENT:     Head: Normocephalic and atraumatic.  Eyes:     Pupils: Pupils are equal, round, and reactive to light.  Neck:     Comments: Right-sided neck swelling appears improved Cardiovascular:     Rate and Rhythm: Normal rate and regular rhythm.     Heart sounds: Normal heart sounds.  Pulmonary:      Effort: Pulmonary effort is normal.     Breath sounds: Normal breath sounds.  Abdominal:     General: Bowel sounds are normal.     Palpations: Abdomen is soft.  Musculoskeletal:     Cervical back: Normal range of motion.  Skin:    General: Skin is warm and dry.  Neurological:     Mental Status: He is alert and oriented to person, place, and time.      CMP Latest Ref Rng & Units 11/04/2019  Glucose 70 - 99 mg/dL 117(H)  BUN 8 - 23 mg/dL 27(H)  Creatinine 0.61 - 1.24 mg/dL 0.95  Sodium 135 - 145 mmol/L 135  Potassium 3.5 - 5.1 mmol/L 4.0  Chloride 98 - 111 mmol/L 99  CO2 22 - 32 mmol/L 24  Calcium 8.9 - 10.3 mg/dL 8.9  Total Protein 6.5 - 8.1 g/dL 7.1  Total Bilirubin 0.3 - 1.2 mg/dL 0.8  Alkaline Phos 38 - 126 U/L 69  AST 15 - 41 U/L 17  ALT 0 - 44 U/L 17   CBC Latest Ref Rng & Units 11/04/2019  WBC 4.0 - 10.5 K/uL 10.3  Hemoglobin 13.0 - 17.0 g/dL 13.8  Hematocrit 39.0 - 52.0 % 42.6  Platelets 150 - 400 K/uL 337    Assessment and plan- Patient is a 69 y.o. 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 He is here for on treatment assessment prior to cycle 2 of carbotaxol Keytruda chemotherapy  Counts okay to proceed with cycle 2 of carbotaxol with chemotherapy today.  He will receive growth factor support on day 2  I will see him back in 3 weeks with CBC with differential, CMP for cycle 3 of carbotaxol Keytruda.  Neoplasm related pain: Continue as needed oxycodone  Weight loss: secondary to poor appetite and difficulty swallowing. Patient wishes to hold off on feeding tube. Continue to monitor   Visit Diagnosis 1. Encounter for antineoplastic chemotherapy   2. Encounter for antineoplastic immunotherapy   3. 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 Neoplasm related pain      Dr. Randa Evens, MD, MPH [facility] [phone] 11/09/2019 7:33  PM

## 2019-11-11 ENCOUNTER — Other Ambulatory Visit: Payer: Self-pay | Admitting: *Deleted

## 2019-11-11 ENCOUNTER — Other Ambulatory Visit: Payer: Self-pay

## 2019-11-11 ENCOUNTER — Ambulatory Visit: Payer: Medicare Other

## 2019-11-11 ENCOUNTER — Inpatient Hospital Stay (HOSPITAL_BASED_OUTPATIENT_CLINIC_OR_DEPARTMENT_OTHER): Payer: Medicare Other | Admitting: Nurse Practitioner

## 2019-11-11 ENCOUNTER — Telehealth: Payer: Self-pay | Admitting: *Deleted

## 2019-11-11 VITALS — BP 104/80 | HR 89 | Resp 16

## 2019-11-11 DIAGNOSIS — R531 Weakness: Secondary | ICD-10-CM

## 2019-11-11 DIAGNOSIS — Z95828 Presence of other vascular implants and grafts: Secondary | ICD-10-CM

## 2019-11-11 DIAGNOSIS — C099 Malignant neoplasm of tonsil, unspecified: Secondary | ICD-10-CM | POA: Diagnosis not present

## 2019-11-11 DIAGNOSIS — E43 Unspecified severe protein-calorie malnutrition: Secondary | ICD-10-CM | POA: Diagnosis not present

## 2019-11-11 DIAGNOSIS — G4700 Insomnia, unspecified: Secondary | ICD-10-CM

## 2019-11-11 DIAGNOSIS — Z5111 Encounter for antineoplastic chemotherapy: Secondary | ICD-10-CM | POA: Diagnosis not present

## 2019-11-11 LAB — CBC WITH DIFFERENTIAL/PLATELET
Abs Immature Granulocytes: 0.05 10*3/uL (ref 0.00–0.07)
Basophils Absolute: 0.1 10*3/uL (ref 0.0–0.1)
Basophils Relative: 1 %
Eosinophils Absolute: 0.3 10*3/uL (ref 0.0–0.5)
Eosinophils Relative: 4 %
HCT: 43.5 % (ref 39.0–52.0)
Hemoglobin: 14.7 g/dL (ref 13.0–17.0)
Immature Granulocytes: 1 %
Lymphocytes Relative: 17 %
Lymphs Abs: 1.7 10*3/uL (ref 0.7–4.0)
MCH: 31 pg (ref 26.0–34.0)
MCHC: 33.8 g/dL (ref 30.0–36.0)
MCV: 91.8 fL (ref 80.0–100.0)
Monocytes Absolute: 0.8 10*3/uL (ref 0.1–1.0)
Monocytes Relative: 8 %
Neutro Abs: 6.9 10*3/uL (ref 1.7–7.7)
Neutrophils Relative %: 69 %
Platelets: 151 10*3/uL (ref 150–400)
RBC: 4.74 MIL/uL (ref 4.22–5.81)
RDW: 14.2 % (ref 11.5–15.5)
WBC: 9.8 10*3/uL (ref 4.0–10.5)
nRBC: 0 % (ref 0.0–0.2)

## 2019-11-11 LAB — COMPREHENSIVE METABOLIC PANEL
ALT: 40 U/L (ref 0–44)
AST: 24 U/L (ref 15–41)
Albumin: 4.2 g/dL (ref 3.5–5.0)
Alkaline Phosphatase: 92 U/L (ref 38–126)
Anion gap: 10 (ref 5–15)
BUN: 31 mg/dL — ABNORMAL HIGH (ref 8–23)
CO2: 26 mmol/L (ref 22–32)
Calcium: 9.1 mg/dL (ref 8.9–10.3)
Chloride: 99 mmol/L (ref 98–111)
Creatinine, Ser: 0.95 mg/dL (ref 0.61–1.24)
GFR calc Af Amer: >60 mL/min (ref >60)
GFR calc non Af Amer: >60 mL/min (ref >60)
Glucose, Bld: 92 mg/dL (ref 70–99)
Potassium: 3.7 mmol/L (ref 3.5–5.1)
Sodium: 135 mmol/L (ref 135–145)
Total Bilirubin: 1 mg/dL (ref 0.3–1.2)
Total Protein: 7.1 g/dL (ref 6.5–8.1)

## 2019-11-11 MED ORDER — DEXAMETHASONE SODIUM PHOSPHATE 10 MG/ML IJ SOLN
10.0000 mg | Freq: Once | INTRAMUSCULAR | Status: AC
  Administered 2019-11-11: 10 mg via INTRAVENOUS
  Filled 2019-11-11: qty 1

## 2019-11-11 MED ORDER — MIRTAZAPINE 15 MG PO TABS: 15.0000 mg | ORAL_TABLET | Freq: Every day | ORAL | 0 refills | Status: DC

## 2019-11-11 MED ORDER — HEPARIN SOD (PORK) LOCK FLUSH 100 UNIT/ML IV SOLN
500.0000 [IU] | Freq: Once | INTRAVENOUS | Status: AC
  Administered 2019-11-11: 500 [IU] via INTRAVENOUS
  Filled 2019-11-11: qty 5

## 2019-11-11 MED ORDER — SODIUM CHLORIDE 0.9 % IV SOLN
Freq: Once | INTRAVENOUS | Status: AC
  Filled 2019-11-11: qty 250

## 2019-11-11 MED ORDER — SODIUM CHLORIDE 0.9% FLUSH
10.0000 mL | INTRAVENOUS | Status: DC | PRN
  Administered 2019-11-11: 10 mL via INTRAVENOUS
  Filled 2019-11-11: qty 10

## 2019-11-11 NOTE — Telephone Encounter (Signed)
Done. Patient is coming in now.

## 2019-11-11 NOTE — Telephone Encounter (Signed)
Lauren/ Sonia Baller- can you see him?

## 2019-11-11 NOTE — Progress Notes (Signed)
Symptom Management [place]  Telephone:(336) [phone] Fax:(336) [phone]  Patient Care Team: Tommy Medal, MD as PCP - General (Family Medicine) Sindy Guadeloupe, MD as Consulting Physician (Oncology)   Name of the patient: Patrick Mendoza  [id number]  13-Jan-1951   Date of visit: 11/11/19  Diagnosis- squamous cell carcinoma of right tonsil, stage IV  Chief complaint/ Reason for visit- poor sleep & dehydration  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[place])  09/23/2019 Initial Diagnosis   Primary squamous cell carcinoma of palatine tonsil ([place])   10/13/2019 -  Chemotherapy   The patient had palonosetron (ALOXI) injection 0.25 mg, 0.25 mg, Intravenous,  Once, 2 of 6 cycles Administration: 0.25 mg (10/13/2019), 0.25 mg (11/04/2019) pegfilgrastim (NEULASTA) injection 6 mg, 6 mg, Subcutaneous, Once, 1 of 5 cycles Administration: 6 mg (11/05/2019) pegfilgrastim-jmdb (FULPHILA) injection 6 mg, 6 mg, Subcutaneous,  Once, 1 of 1 cycle Administration: 6 mg (10/15/2019) CARBOplatin (PARAPLATIN) 640 mg in sodium chloride 0.9 % 250 mL chemo infusion, 640 mg (100 % of original dose 641.5 mg), Intravenous,  Once, 2 of 6 cycles Dose modification:   (original dose 641.5 mg, Cycle 1) Administration: 640 mg (10/13/2019), 640 mg (11/04/2019) fosaprepitant (EMEND) 150 mg in sodium chloride 0.9 % 145 mL IVPB, 150 mg, Intravenous,  Once, 2 of 6 cycles Administration: 150 mg (10/13/2019), 150 mg (11/04/2019) PACLitaxel (TAXOL) 390 mg in sodium chloride 0.9 % 500 mL chemo infusion (> 80mg /m2), 175 mg/m2 = 444 mg, Intravenous,  Once, 2 of 6 cycles Dose modification: 175 mg/m2 (original dose 200 mg/m2, Cycle 3, Reason: Provider Judgment, Comment: decrease dose per md) Administration: 390 mg (10/13/2019), 390 mg (11/04/2019) pembrolizumab (KEYTRUDA) 200 mg in sodium chloride 0.9 % 50 mL chemo infusion, 200 mg, Intravenous, Once, 2 of 6  cycles Administration: 200 mg (10/13/2019), 200 mg (11/04/2019)  for chemotherapy treatment.      Interval history- Patrick Mendoza, 69 year old male diagnosed with stage IV tonsillar cancer who presents to [facility] for poor oral intake, suspected dehydration, and insomnia.  He says that he has eaten very little and drinks only sips over the last few days.  Swallowing is difficult and he has a sensation of something stuck in his throat.  He feels exhausted throughout the day and then cannot sleep at night.  Occasional nausea which is relieved with ODT Zofran.  Has been struggling to take some of his medications due to difficulty swallowing.  He is previously declined a feeding tube and today says that he would not want one.   ECOG FS:2 - Symptomatic, <50% confined to bed  Review of systems- Review of Systems  Constitutional: Positive for malaise/fatigue and weight loss. Negative for chills and fever.  HENT: Positive for sore throat. Negative for hearing loss, nosebleeds and tinnitus.   Eyes: Negative for blurred vision and double vision.  Respiratory: Negative for cough, hemoptysis, shortness of breath and wheezing.   Cardiovascular: Negative for chest pain, palpitations and leg swelling.  Gastrointestinal: Positive for nausea. Negative for abdominal pain, blood in stool, constipation, diarrhea, melena and vomiting.  Genitourinary: Negative for dysuria and urgency.  Musculoskeletal: Negative for back pain, falls, joint pain and myalgias.  Skin: Negative for itching and rash.  Neurological: Positive for weakness. Negative for dizziness, tingling, sensory change, loss of consciousness and headaches.  Endo/Heme/Allergies: Negative for environmental allergies. Does not bruise/bleed easily.  Psychiatric/Behavioral: Negative for depression. The patient has insomnia.  The patient is not nervous/anxious.      Current treatment-carbo-Taxol-Keytruda  No Known Allergies  Past Medical  History:  Diagnosis Date  . Asthma   . Lung nodules   . Tonsil cancer [facility])     Past Surgical History:  Procedure Laterality Date  . CERVICAL SPINE SURGERY    . LUMBAR SPINE SURGERY    . PORTA CATH INSERTION N/A 10/08/2019   Procedure: PORTA CATH INSERTION;  Surgeon: Katha Cabal, MD;  Location: [place] [facility];  Service: Cardiovascular;  Laterality: N/A;  . VIDEO BRONCHOSCOPY WITH ENDOBRONCHIAL NAVIGATION N/A 10/10/2019   Procedure: VIDEO BRONCHOSCOPY WITH ENDOBRONCHIAL NAVIGATION;  Surgeon: Tyler Pita, MD;  Location: [facility] ORS;  Service: Pulmonary;  Laterality: N/A;  . VIDEO BRONCHOSCOPY WITH ENDOBRONCHIAL ULTRASOUND N/A 10/10/2019   Procedure: VIDEO BRONCHOSCOPY WITH ENDOBRONCHIAL ULTRASOUND;  Surgeon: Tyler Pita, MD;  Location: [facility] ORS;  Service: Pulmonary;  Laterality: N/A;    Social History   Socioeconomic History  . Marital status: Widowed    Spouse name: Not on file  . Number of children: Not on file  . Years of education: Not on file  . Highest education level: Not on file  Occupational History  . Not on file  Tobacco Use  . Smoking status: Never Smoker  . Smokeless tobacco: Never Used  Substance and Sexual Activity  . Alcohol use: Never  . Drug use: Never  . Sexual activity: Not on file  Other Topics Concern  . Not on file  Social History Narrative  . Not on file   Social Determinants of Health   Financial Resource Strain:   . Difficulty of Paying Living Expenses:   Food Insecurity:   . Worried About Mendoza fundraiser in the Last Year:   . Arboriculturist in the Last Year:   Transportation Needs:   . Film/video editor (Medical):   Marland Kitchen Lack of Transportation (Non-Medical):   Physical Activity:   . Days of Exercise per Week:   . Minutes of Exercise per Session:   Stress:   . Feeling of Stress :   Social Connections:   . Frequency of Communication with Friends and Family:   . Frequency of Social Gatherings with Friends and  Family:   . Attends Religious Services:   . Active Member of Clubs or Organizations:   . Attends Archivist Meetings:   Marland Kitchen Marital Status:   Intimate Partner Violence:   . Fear of Current or Ex-Partner:   . Emotionally Abused:   Marland Kitchen Physically Abused:   . Sexually Abused:     Family History  Problem Relation Age of Onset  . Cancer Mother 68       not sure what type  . Prostate cancer Father   . Rheum arthritis Father   . Healthy Sister   . Healthy Brother   . Healthy Sister   . Healthy Brother   . Healthy Brother   . Heart disease Brother   . Heart disease Brother      Current Outpatient Medications:  .  acetaminophen (TYLENOL) 500 MG tablet, Take 500-1,000 mg by mouth every 8 (eight) hours as needed (for pain.). , Disp: , Rfl:  .  albuterol (VENTOLIN HFA) 108 (90 Base) MCG/ACT inhaler, Inhale 2 puffs into the lungs every 6 (six) hours as needed (wheezing/shortness of breath). , Disp: , Rfl:  .  dexamethasone (DECADRON) 4 MG tablet, Take 2 tablets by mouth once a day  for 3 days after chemo. Take with food., Disp: 30 tablet, Rfl: 1 .  lidocaine-prilocaine (EMLA) cream, Apply to affected area once, Disp: 30 g, Rfl: 3 .  LORazepam (ATIVAN) 0.5 MG tablet, Take 1 tablet (0.5 mg total) by mouth every 6 (six) hours as needed (Nausea or vomiting)., Disp: 30 tablet, Rfl: 0 .  magic mouthwash w/lidocaine SOLN, Take 5 mLs by mouth 4 (four) times daily as needed for mouth pain., Disp: 320 mL, Rfl: 1 .  ondansetron (ZOFRAN) 8 MG tablet, Take 1 tablet (8 mg total) by mouth 2 (two) times daily as needed. Start on the third day after chemotherapy., Disp: 30 tablet, Rfl: 1 .  oxyCODONE (OXY IR/ROXICODONE) 5 MG immediate release tablet, Take 1 tablet (5 mg total) by mouth every 4 (four) hours as needed for severe pain., Disp: 60 tablet, Rfl: 0 .  potassium chloride SA (KLOR-CON) 20 MEQ tablet, Take 1 tablet (20 mEq total) by mouth daily., Disp: 15 tablet, Rfl: 0 .  prochlorperazine  (COMPAZINE) 10 MG tablet, Take 1 tablet (10 mg total) by mouth every 6 (six) hours as needed (Nausea or vomiting)., Disp: 30 tablet, Rfl: 1 .  SYMBICORT 160-4.5 MCG/ACT inhaler, Inhale 2 puffs into the lungs 2 (two) times daily., Disp: , Rfl:   Physical exam:  Vitals:   11/11/19 1358  BP: 104/80  Pulse: 89  Resp: 16  TempSrc: Tympanic  SpO2: 99%   Physical Exam Constitutional:      General: He is not in acute distress. HENT:     Head: Normocephalic.     Mouth/Throat:     Mouth: Mucous membranes are dry.  Eyes:     General: No scleral icterus.    Conjunctiva/sclera: Conjunctivae normal.  Pulmonary:     Effort: Pulmonary effort is normal. No respiratory distress.  Abdominal:     Tenderness: There is no guarding.  Musculoskeletal:        General: No deformity.  Skin:    General: Skin is dry.     Coloration: Skin is not pale.  Neurological:     Mental Status: He is alert and oriented to person, place, and time.     Motor: No weakness.  Psychiatric:        Mood and Affect: Mood normal.        Behavior: Behavior normal.      CMP Latest Ref Rng & Units 11/11/2019  Glucose 70 - 99 mg/dL 92  BUN 8 - 23 mg/dL 31(H)  Creatinine 0.61 - 1.24 mg/dL 0.95  Sodium 135 - 145 mmol/L 135  Potassium 3.5 - 5.1 mmol/L 3.7  Chloride 98 - 111 mmol/L 99  CO2 22 - 32 mmol/L 26  Calcium 8.9 - 10.3 mg/dL 9.1  Total Protein 6.5 - 8.1 g/dL 7.1  Total Bilirubin 0.3 - 1.2 mg/dL 1.0  Alkaline Phos 38 - 126 U/L 92  AST 15 - 41 U/L 24  ALT 0 - 44 U/L 40   CBC Latest Ref Rng & Units 11/11/2019  WBC 4.0 - 10.5 K/uL 9.8  Hemoglobin 13.0 - 17.0 g/dL 14.7  Hematocrit 39.0 - 52.0 % 43.5  Platelets 150 - 400 K/uL 151    No images are attached to the encounter.  No results found.  Assessment and plan- Patient is a 69 y.o. male diagnosed with squamous cell carcinoma of the tonsil stage IV, currently receiving carbo-Taxol chemotherapy with Keytruda who presents to symptom management clinic for  complaints of dehydration, malnutrition, and insomnia.  1.  Malnutrition-secondary to difficulty swallowing and general poor oral intake.  Declined feeding tube.  Encouraged small frequent calorie dense, protein rich meals.  Albumin stable at 4.2.  Continues to have significant weight loss.  Continue follow-up with dietitian for monitoring of weights and trends  2.  Dehydration-secondary to poor oral intake. BUN elevated at 31.  Sodium 135.  Creatinine 0.95.  Generally improved but clinically appears dry.  IV fluids in clinic today.  3.  Difficulty swallowing-secondary to malignancy.  Will give single dose of steroids today.  Attempt to limit in setting of Keytruda.  May also help to stimulate appetite.  4.  Insomnia-start Remeron 15 mg at bedtime as this may help insomnia and appetite stimulation.  Reviewed plan to assess tolerance and may increase to 30 mg at next visit with maximum dose of 45 mg/day as a single dose at bedtime or 2 divided doses.  Discussed that appetite may respond better to twice daily dosing.  If no improvement in 6 weeks would consider discontinuation.  -Disposition: Return to clinic on 11/14/2019 for follow-up with Rulon Abide, NP with labs and fluids.   Visit Diagnosis 1. Weakness   2. 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 Insomnia, unspecified type   4. Severe protein-calorie malnutrition ([place])     Patient expressed understanding and was in agreement with this plan. He also understands that He can call clinic at any time with any questions, concerns, or complaints.   Thank you for allowing me to participate in the care of this very pleasant patient.   Beckey Rutter, DNP, AGNP-C [place] [facility] [place]  CC: Dr. Janese Banks

## 2019-11-11 NOTE — Telephone Encounter (Signed)
Doni- could you please contact and get him scheduled? Lab, NP, Infusion. Thanks!

## 2019-11-11 NOTE — Telephone Encounter (Signed)
Patient called reporting that he has not slept in 3-4 days and that he thinks he is dehydrated. Please advise

## 2019-11-11 NOTE — Telephone Encounter (Signed)
Spoke to patient via telephone. He is going to come in now for labs, to see NP and get IV fluids if needed.

## 2019-11-14 ENCOUNTER — Inpatient Hospital Stay (HOSPITAL_BASED_OUTPATIENT_CLINIC_OR_DEPARTMENT_OTHER): Payer: Medicare Other | Admitting: Oncology

## 2019-11-14 ENCOUNTER — Inpatient Hospital Stay: Payer: Medicare Other

## 2019-11-14 ENCOUNTER — Other Ambulatory Visit: Payer: Self-pay

## 2019-11-14 ENCOUNTER — Encounter: Payer: Self-pay | Admitting: Oncology

## 2019-11-14 VITALS — Wt 196.0 lb

## 2019-11-14 VITALS — BP 122/69 | HR 78 | Temp 97.4°F | Resp 16 | Wt 196.0 lb

## 2019-11-14 DIAGNOSIS — E876 Hypokalemia: Secondary | ICD-10-CM

## 2019-11-14 DIAGNOSIS — C099 Malignant neoplasm of tonsil, unspecified: Secondary | ICD-10-CM

## 2019-11-14 DIAGNOSIS — R531 Weakness: Secondary | ICD-10-CM

## 2019-11-14 DIAGNOSIS — Z95828 Presence of other vascular implants and grafts: Secondary | ICD-10-CM

## 2019-11-14 DIAGNOSIS — E43 Unspecified severe protein-calorie malnutrition: Secondary | ICD-10-CM | POA: Insufficient documentation

## 2019-11-14 DIAGNOSIS — R110 Nausea: Secondary | ICD-10-CM

## 2019-11-14 DIAGNOSIS — Z5111 Encounter for antineoplastic chemotherapy: Secondary | ICD-10-CM | POA: Diagnosis not present

## 2019-11-14 DIAGNOSIS — I951 Orthostatic hypotension: Secondary | ICD-10-CM

## 2019-11-14 DIAGNOSIS — G893 Neoplasm related pain (acute) (chronic): Secondary | ICD-10-CM

## 2019-11-14 LAB — COMPREHENSIVE METABOLIC PANEL
ALT: 58 U/L — ABNORMAL HIGH (ref 0–44)
AST: 30 U/L (ref 15–41)
Albumin: 3.8 g/dL (ref 3.5–5.0)
Alkaline Phosphatase: 92 U/L (ref 38–126)
Anion gap: 9 (ref 5–15)
BUN: 21 mg/dL (ref 8–23)
CO2: 25 mmol/L (ref 22–32)
Calcium: 8.6 mg/dL — ABNORMAL LOW (ref 8.9–10.3)
Chloride: 101 mmol/L (ref 98–111)
Creatinine, Ser: 1 mg/dL (ref 0.61–1.24)
GFR calc Af Amer: >60 mL/min (ref >60)
GFR calc non Af Amer: >60 mL/min (ref >60)
Glucose, Bld: 96 mg/dL (ref 70–99)
Potassium: 3.3 mmol/L — ABNORMAL LOW (ref 3.5–5.1)
Sodium: 135 mmol/L (ref 135–145)
Total Bilirubin: 0.8 mg/dL (ref 0.3–1.2)
Total Protein: 6.5 g/dL (ref 6.5–8.1)

## 2019-11-14 LAB — CBC WITH DIFFERENTIAL/PLATELET
Abs Immature Granulocytes: 0.08 10*3/uL — ABNORMAL HIGH (ref 0.00–0.07)
Basophils Absolute: 0 10*3/uL (ref 0.0–0.1)
Basophils Relative: 0 %
Eosinophils Absolute: 0.2 10*3/uL (ref 0.0–0.5)
Eosinophils Relative: 2 %
HCT: 41.2 % (ref 39.0–52.0)
Hemoglobin: 13.9 g/dL (ref 13.0–17.0)
Immature Granulocytes: 1 %
Lymphocytes Relative: 16 %
Lymphs Abs: 1.6 10*3/uL (ref 0.7–4.0)
MCH: 30.8 pg (ref 26.0–34.0)
MCHC: 33.7 g/dL (ref 30.0–36.0)
MCV: 91.2 fL (ref 80.0–100.0)
Monocytes Absolute: 0.6 10*3/uL (ref 0.1–1.0)
Monocytes Relative: 6 %
Neutro Abs: 7.7 10*3/uL (ref 1.7–7.7)
Neutrophils Relative %: 75 %
Platelets: 164 10*3/uL (ref 150–400)
RBC: 4.52 MIL/uL (ref 4.22–5.81)
RDW: 14.5 % (ref 11.5–15.5)
WBC: 10.2 10*3/uL (ref 4.0–10.5)
nRBC: 0 % (ref 0.0–0.2)

## 2019-11-14 MED ORDER — DEXAMETHASONE SODIUM PHOSPHATE 10 MG/ML IJ SOLN
10.0000 mg | Freq: Once | INTRAMUSCULAR | Status: AC
  Administered 2019-11-14: 10 mg via INTRAVENOUS
  Filled 2019-11-14: qty 1

## 2019-11-14 MED ORDER — POTASSIUM CHLORIDE 20 MEQ/100ML IV SOLN
20.0000 meq | Freq: Once | INTRAVENOUS | Status: AC
  Administered 2019-11-14: 20 meq via INTRAVENOUS

## 2019-11-14 MED ORDER — HEPARIN SOD (PORK) LOCK FLUSH 100 UNIT/ML IV SOLN
500.0000 [IU] | Freq: Once | INTRAVENOUS | Status: AC
  Administered 2019-11-14: 500 [IU] via INTRAVENOUS
  Filled 2019-11-14: qty 5

## 2019-11-14 MED ORDER — SODIUM CHLORIDE 0.9% FLUSH
10.0000 mL | INTRAVENOUS | Status: DC | PRN
  Administered 2019-11-14: 10 mL via INTRAVENOUS
  Filled 2019-11-14: qty 10

## 2019-11-14 MED ORDER — SODIUM CHLORIDE 0.9 % IV SOLN
Freq: Once | INTRAVENOUS | Status: AC
  Filled 2019-11-14: qty 250

## 2019-11-14 MED ORDER — SODIUM CHLORIDE 0.9 % IV SOLN
20.0000 meq | Freq: Once | INTRAVENOUS | Status: DC
  Filled 2019-11-14: qty 10

## 2019-11-14 MED ORDER — MIRTAZAPINE 30 MG PO TABS: 30.0000 mg | ORAL_TABLET | Freq: Every day | ORAL | 0 refills | Status: DC

## 2019-11-14 MED ORDER — ONDANSETRON HCL 4 MG/2ML IJ SOLN
8.0000 mg | Freq: Once | INTRAMUSCULAR | Status: AC
  Administered 2019-11-14: 8 mg via INTRAVENOUS
  Filled 2019-11-14: qty 4

## 2019-11-14 MED ORDER — OXYCODONE HCL 5 MG PO TABS
5.0000 mg | ORAL_TABLET | Freq: Once | ORAL | Status: AC
  Administered 2019-11-14: 5 mg via ORAL
  Filled 2019-11-14: qty 1

## 2019-11-14 NOTE — Progress Notes (Signed)
Symptom Management Consult note [facility]  [phone] [phone]  Patient Care Team: Tommy Medal, MD as PCP - General (Family Medicine) Sindy Guadeloupe, MD as Consulting Physician (Oncology)   Name of the patient: Patrick Mendoza  [id number]  27-Sep-1950   Date of visit: 11/14/2019   Diagnosis- 1. Weakness - CBC with Differential; Future - Comprehensive metabolic panel; Future  2. Hypokalemia - Comprehensive metabolic panel; Future  3. 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) - CBC with Differential; Future - Comprehensive metabolic panel; Future    Chief complaint/ Reason for visit- Follow-up  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[place])  09/23/2019 Initial Diagnosis   Primary squamous cell carcinoma of palatine tonsil ([place])   10/13/2019 -  Chemotherapy   The patient had palonosetron (ALOXI) injection 0.25 mg, 0.25 mg, Intravenous,  Once, 2 of 6 cycles Administration: 0.25 mg (10/13/2019), 0.25 mg (11/04/2019) pegfilgrastim (NEULASTA) injection 6 mg, 6 mg, Subcutaneous, Once, 1 of 5 cycles Administration: 6 mg (11/05/2019) pegfilgrastim-jmdb (FULPHILA) injection 6 mg, 6 mg, Subcutaneous,  Once, 1 of 1 cycle Administration: 6 mg (10/15/2019) CARBOplatin (PARAPLATIN) 640 mg in sodium chloride 0.9 % 250 mL chemo infusion, 640 mg (100 % of original dose 641.5 mg), Intravenous,  Once, 2 of 6 cycles Dose modification:   (original dose 641.5 mg, Cycle 1) Administration: 640 mg (10/13/2019), 640 mg (11/04/2019) fosaprepitant (EMEND) 150 mg in sodium chloride 0.9 % 145 mL IVPB, 150 mg, Intravenous,  Once, 2 of 6 cycles Administration: 150 mg (10/13/2019), 150 mg (11/04/2019) PACLitaxel (TAXOL) 390 mg in sodium chloride 0.9 % 500 mL chemo infusion (> 80mg /m2), 175 mg/m2 = 444 mg, Intravenous,  Once, 2 of 6 cycles Dose modification: 175 mg/m2 (original dose 200 mg/m2, Cycle 3,  Reason: Provider Judgment, Comment: decrease dose per md) Administration: 390 mg (10/13/2019), 390 mg (11/04/2019) pembrolizumab (KEYTRUDA) 200 mg in sodium chloride 0.9 % 50 mL chemo infusion, 200 mg, Intravenous, Once, 2 of 6 cycles Administration: 200 mg (10/13/2019), 200 mg (11/04/2019)  for chemotherapy treatment.     Interval history-patient presents to symptom management today for follow-up.  He was recently evaluated by Beckey Rutter, NP for orthostatic hypotension, dehydration, anorexia and insomnia.  He was given IV fluids, IV steroids and IV nausea medicine.  He was started on Remeron 15 mg at bedtime.  Patient states symptoms have improved since he was seen here last.  His appetite has improved slightly and yesterday he was able to eat an entire jelly sandwich.  He started the Remeron as prescribed and has taken 2 doses.  He noted no improvement of his insomnia and restless legs on the first night and doubled up the medication on the second night which allowed him to sleep for approximately 3 hours.  He is drinking supplements typically 1/day.  He notes continued problems swallowing.  He feels like "he has something stuck in the back of his throat".  He feels achy all over especially in his left hand and legs.  He notes left nostril bleeding x1 episode which was easily stopped.  He denies any additional pain, vomiting, constipation or diarrhea.  He denies any shortness of breath or chest pain.  He has occasional nausea and takes ODT Zofran with improvement.  He has been evaluated by our dietitian at the cancer center and continues to 1 hold off on a feeding tube at this time.  He is attempting  to incorporate daily supplements.  ECOG FS:1 - Symptomatic but completely ambulatory  Review of systems- Review of Systems  Constitutional: Positive for malaise/fatigue and weight loss. Negative for chills and fever.  HENT: Negative for congestion, ear pain and tinnitus.   Eyes: Negative.  Negative for  blurred vision and double vision.  Respiratory: Negative.  Negative for cough, sputum production and shortness of breath.   Cardiovascular: Negative.  Negative for chest pain, palpitations and leg swelling.  Gastrointestinal: Positive for nausea. Negative for abdominal pain, constipation, diarrhea and vomiting.  Genitourinary: Negative for dysuria, frequency and urgency.  Musculoskeletal: Negative for back pain and falls.  Skin: Negative.  Negative for rash.  Neurological: Positive for weakness. Negative for headaches.  Endo/Heme/Allergies: Negative.  Does not bruise/bleed easily.  Psychiatric/Behavioral: Negative.  Negative for depression. The patient is not nervous/anxious and does not have insomnia.      Current treatment- carbo/taxol/keytruda  No Known Allergies   Past Medical History:  Diagnosis Date  . Asthma   . Lung nodules   . Tonsil cancer [facility])      Past Surgical History:  Procedure Laterality Date  . CERVICAL SPINE SURGERY    . LUMBAR SPINE SURGERY    . PORTA CATH INSERTION N/A 10/08/2019   Procedure: PORTA CATH INSERTION;  Surgeon: Katha Cabal, MD;  Location: [facility];  Service: Cardiovascular;  Laterality: N/A;  . VIDEO BRONCHOSCOPY WITH ENDOBRONCHIAL NAVIGATION N/A 10/10/2019   Procedure: VIDEO BRONCHOSCOPY WITH ENDOBRONCHIAL NAVIGATION;  Surgeon: Tyler Pita, MD;  Location: [facility];  Service: Pulmonary;  Laterality: N/A;  . VIDEO BRONCHOSCOPY WITH ENDOBRONCHIAL ULTRASOUND N/A 10/10/2019   Procedure: VIDEO BRONCHOSCOPY WITH ENDOBRONCHIAL ULTRASOUND;  Surgeon: Tyler Pita, MD;  Location: [facility];  Service: Pulmonary;  Laterality: N/A;    Social History   Socioeconomic History  . Marital status: Widowed    Spouse name: Not on file  . Number of children: Not on file  . Years of education: Not on file  . Highest education level: Not on file  Occupational History  . Not on file  Tobacco Use  . Smoking status: Never Smoker  .  Smokeless tobacco: Never Used  Substance and Sexual Activity  . Alcohol use: Never  . Drug use: Never  . Sexual activity: Not on file  Other Topics Concern  . Not on file  Social History Narrative  . Not on file   Social Determinants of Health   Financial Resource Strain:   . Difficulty of Paying Living Expenses:   Food Insecurity:   . Worried About Charity fundraiser in the Last Year:   . Arboriculturist in the Last Year:   Transportation Needs:   . Film/video editor (Medical):   Marland Kitchen Lack of Transportation (Non-Medical):   Physical Activity:   . Days of Exercise per Week:   . Minutes of Exercise per Session:   Stress:   . Feeling of Stress :   Social Connections:   . Frequency of Communication with Friends and Family:   . Frequency of Social Gatherings with Friends and Family:   . Attends Religious Services:   . Active Member of Clubs or Organizations:   . Attends Archivist Meetings:   Marland Kitchen Marital Status:   Intimate Partner Violence:   . Fear of Current or Ex-Partner:   . Emotionally Abused:   Marland Kitchen Physically Abused:   . Sexually Abused:     Family History  Problem  Relation Age of Onset  . Cancer Mother 23       not sure what type  . Prostate cancer Father   . Rheum arthritis Father   . Healthy Sister   . Healthy Brother   . Healthy Sister   . Healthy Brother   . Healthy Brother   . Heart disease Brother   . Heart disease Brother      Current Outpatient Medications:  .  acetaminophen (TYLENOL) 500 MG tablet, Take 500-1,000 mg by mouth every 8 (eight) hours as needed (for pain.). , Disp: , Rfl:  .  albuterol (VENTOLIN HFA) 108 (90 Base) MCG/ACT inhaler, Inhale 2 puffs into the lungs every 6 (six) hours as needed (wheezing/shortness of breath). , Disp: , Rfl:  .  dexamethasone (DECADRON) 4 MG tablet, Take 2 tablets by mouth once a day for 3 days after chemo. Take with food., Disp: 30 tablet, Rfl: 1 .  lidocaine-prilocaine (EMLA) cream, Apply to  affected area once, Disp: 30 g, Rfl: 3 .  LORazepam (ATIVAN) 0.5 MG tablet, Take 1 tablet (0.5 mg total) by mouth every 6 (six) hours as needed (Nausea or vomiting)., Disp: 30 tablet, Rfl: 0 .  magic mouthwash w/lidocaine SOLN, Take 5 mLs by mouth 4 (four) times daily as needed for mouth pain., Disp: 320 mL, Rfl: 1 .  mirtazapine (REMERON) 15 MG tablet, Take 1 tablet (15 mg total) by mouth at bedtime. For appetite and sleep, Disp: 7 tablet, Rfl: 0 .  ondansetron (ZOFRAN) 8 MG tablet, Take 1 tablet (8 mg total) by mouth 2 (two) times daily as needed. Start on the third day after chemotherapy., Disp: 30 tablet, Rfl: 1 .  oxyCODONE (OXY IR/ROXICODONE) 5 MG immediate release tablet, Take 1 tablet (5 mg total) by mouth every 4 (four) hours as needed for severe pain., Disp: 60 tablet, Rfl: 0 .  potassium chloride SA (KLOR-CON) 20 MEQ tablet, Take 1 tablet (20 mEq total) by mouth daily., Disp: 15 tablet, Rfl: 0 .  prochlorperazine (COMPAZINE) 10 MG tablet, Take 1 tablet (10 mg total) by mouth every 6 (six) hours as needed (Nausea or vomiting)., Disp: 30 tablet, Rfl: 1 .  SYMBICORT 160-4.5 MCG/ACT inhaler, Inhale 2 puffs into the lungs 2 (two) times daily., Disp: , Rfl:   Physical exam: There were no vitals filed for this visit. Physical Exam Constitutional:      Appearance: Normal appearance.     Comments: Orthostatic Weight loss  HENT:     Head: Normocephalic and atraumatic.  Eyes:     Pupils: Pupils are equal, round, and reactive to light.  Cardiovascular:     Rate and Rhythm: Normal rate and regular rhythm.     Heart sounds: Normal heart sounds. No murmur.  Pulmonary:     Effort: Pulmonary effort is normal.     Breath sounds: Normal breath sounds. No wheezing.  Abdominal:     General: Bowel sounds are normal. There is no distension.     Palpations: Abdomen is soft.     Tenderness: There is no abdominal tenderness.  Musculoskeletal:        General: Normal range of motion.     Cervical  back: Normal range of motion.  Skin:    General: Skin is warm and dry.     Findings: No rash.  Neurological:     Mental Status: He is alert and oriented to person, place, and time.  Psychiatric:        Judgment: Judgment normal.  CMP Latest Ref Rng & Units 11/11/2019  Glucose 70 - 99 mg/dL 92  BUN 8 - 23 mg/dL 31(H)  Creatinine 0.61 - 1.24 mg/dL 0.95  Sodium 135 - 145 mmol/L 135  Potassium 3.5 - 5.1 mmol/L 3.7  Chloride 98 - 111 mmol/L 99  CO2 22 - 32 mmol/L 26  Calcium 8.9 - 10.3 mg/dL 9.1  Total Protein 6.5 - 8.1 g/dL 7.1  Total Bilirubin 0.3 - 1.2 mg/dL 1.0  Alkaline Phos 38 - 126 U/L 92  AST 15 - 41 U/L 24  ALT 0 - 44 U/L 40   CBC Latest Ref Rng & Units 11/11/2019  WBC 4.0 - 10.5 K/uL 9.8  Hemoglobin 13.0 - 17.0 g/dL 14.7  Hematocrit 39.0 - 52.0 % 43.5  Platelets 150 - 400 K/uL 151    No images are attached to the encounter.  No results found.  Assessment and plan- Patient is a 69 y.o. male who presents to symptom management for follow-up with lab work for anorexia, insomnia, dehydration and dysphagia.  He is currently being treated for right tonsil cancer.  He received 2 cycles of carbo/Taxol and Keytruda.  He is scheduled to return for cycle 3 on 11/25/19.   Patient appears to be doing better since his visit 3 days ago.  Lab work from today shows mild hypokalemia and elevated ALT.  Will replace potassium with 20 mEq IV while he is in clinic.  He notes trouble swallowing large pills.  He is orthostatic with a significant systolic drop.  Will give IV fluids.  He has some nausea.  Will give IV Zofran and IV dexamethasone.  He states he thought this helped last time.  Continue to limit steroids given treatment with Keytruda.  He continues to lose weight.  Weight today is 196 pounds which is an 8 pound weight loss in 10 days.  He continues to decline a feeding tube.  We will supply him with plenty of nutritional supplements along with Ensure or electrolyte packets.   Additionally, we will increase his Remeron from 15 mg nightly to 30 mg nightly to see if this helps with his insomnia, appetite and restless leg.  He has scheduled follow-up with our dietitian on 11/27/2019.  New prescription sent to pharmacy.  He notes all over joint and body aches.  This is likely due to Neulasta.  I recommend he begin Claritin and Tylenol after his next treatment.  Nose bleed d/t unknown etiology- Platelet count stable. Continue to monitor for now.   We will continue to monitor his kidney, liver and electrolytes with each visit.  Disposition: RTC on Monday, 11/17/2019 for repeat lab work and additional IV fluids.  Visit Diagnosis 1. Weakness   2. Hypokalemia   3. 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 He also understands that He can call clinic at any time with any questions, concerns, or complaints.   Greater than 50% was spent in counseling and coordination of care with this patient including but not limited to discussion of the relevant topics above (See A&P) including, but not limited to diagnosis and management of acute and chronic medical conditions.   Thank you for allowing me to participate in the care of this very pleasant patient.    Jacquelin Hawking, NP [place] at [facility] Cell - [phone] Pager- [phone] 11/14/2019 9:13 AM

## 2019-11-14 NOTE — Patient Instructions (Signed)
I am glad that you are feeling better today.  While you are here you were given IV fluids, IV steroids and IV nausea medicine.  You also were given IV potassium because your potassium was little low today.  I sent a new prescription for the increased dose of your Remeron.  You can begin taking 30 mg nightly tonight.  You can either double up on your 15 mg tablets or begin the 30 mg with your new prescription.  Continue to try to drink supplements as much as you can.  I will give you some samples so that you can try different ones to see if you like one of her the other.  I also will give you some electrolyte packets you can mix with water.  Try to drink 1 of these daily.  To help with your swallowing we can try some Juven which is a supplement to help with tissue healing.  We will see if this helps on Monday when you return.  For the body aches I recommend you begin Claritin and Tylenol-1 tablet daily of each after your next treatment.  This may help with the achy feeling.  Continue your oxycodone as needed for pain.  Please let us know if any symptoms worsen.  We will see back on Monday for additional lab work and possible IV fluids.  Faythe Casa, NP 11/14/2019 10:15 AM

## 2019-11-17 ENCOUNTER — Inpatient Hospital Stay: Payer: Medicare Other

## 2019-11-17 ENCOUNTER — Inpatient Hospital Stay: Payer: Medicare Other | Admitting: Oncology

## 2019-11-17 ENCOUNTER — Other Ambulatory Visit: Payer: Self-pay

## 2019-11-17 VITALS — BP 117/79 | HR 79 | Temp 97.9°F | Resp 18

## 2019-11-17 DIAGNOSIS — C099 Malignant neoplasm of tonsil, unspecified: Secondary | ICD-10-CM

## 2019-11-17 DIAGNOSIS — Z5111 Encounter for antineoplastic chemotherapy: Secondary | ICD-10-CM | POA: Diagnosis not present

## 2019-11-17 DIAGNOSIS — R531 Weakness: Secondary | ICD-10-CM

## 2019-11-17 LAB — CBC WITH DIFFERENTIAL/PLATELET
Abs Immature Granulocytes: 0.09 10*3/uL — ABNORMAL HIGH (ref 0.00–0.07)
Basophils Absolute: 0.1 10*3/uL (ref 0.0–0.1)
Basophils Relative: 0 %
Eosinophils Absolute: 0.1 10*3/uL (ref 0.0–0.5)
Eosinophils Relative: 1 %
HCT: 40.5 % (ref 39.0–52.0)
Hemoglobin: 13.2 g/dL (ref 13.0–17.0)
Immature Granulocytes: 1 %
Lymphocytes Relative: 9 %
Lymphs Abs: 1.2 10*3/uL (ref 0.7–4.0)
MCH: 30.8 pg (ref 26.0–34.0)
MCHC: 32.6 g/dL (ref 30.0–36.0)
MCV: 94.4 fL (ref 80.0–100.0)
Monocytes Absolute: 0.7 10*3/uL (ref 0.1–1.0)
Monocytes Relative: 5 %
Neutro Abs: 11.7 10*3/uL — ABNORMAL HIGH (ref 1.7–7.7)
Neutrophils Relative %: 84 %
Platelets: 197 10*3/uL (ref 150–400)
RBC: 4.29 MIL/uL (ref 4.22–5.81)
RDW: 15 % (ref 11.5–15.5)
WBC: 13.8 10*3/uL — ABNORMAL HIGH (ref 4.0–10.5)
nRBC: 0 % (ref 0.0–0.2)

## 2019-11-17 LAB — COMPREHENSIVE METABOLIC PANEL
ALT: 34 U/L (ref 0–44)
AST: 20 U/L (ref 15–41)
Albumin: 3.7 g/dL (ref 3.5–5.0)
Alkaline Phosphatase: 107 U/L (ref 38–126)
Anion gap: 9 (ref 5–15)
BUN: 15 mg/dL (ref 8–23)
CO2: 24 mmol/L (ref 22–32)
Calcium: 8.5 mg/dL — ABNORMAL LOW (ref 8.9–10.3)
Chloride: 105 mmol/L (ref 98–111)
Creatinine, Ser: 0.91 mg/dL (ref 0.61–1.24)
GFR calc Af Amer: >60 mL/min (ref >60)
GFR calc non Af Amer: >60 mL/min (ref >60)
Glucose, Bld: 102 mg/dL — ABNORMAL HIGH (ref 70–99)
Potassium: 3.9 mmol/L (ref 3.5–5.1)
Sodium: 138 mmol/L (ref 135–145)
Total Bilirubin: 0.4 mg/dL (ref 0.3–1.2)
Total Protein: 6.3 g/dL — ABNORMAL LOW (ref 6.5–8.1)

## 2019-11-17 MED ORDER — SODIUM CHLORIDE 0.9 % IV SOLN
Freq: Once | INTRAVENOUS | Status: AC
  Filled 2019-11-17: qty 250

## 2019-11-20 ENCOUNTER — Other Ambulatory Visit: Payer: Self-pay

## 2019-11-20 ENCOUNTER — Inpatient Hospital Stay: Payer: Medicare Other

## 2019-11-20 DIAGNOSIS — E876 Hypokalemia: Secondary | ICD-10-CM

## 2019-11-20 DIAGNOSIS — C099 Malignant neoplasm of tonsil, unspecified: Secondary | ICD-10-CM

## 2019-11-20 DIAGNOSIS — R531 Weakness: Secondary | ICD-10-CM

## 2019-11-20 DIAGNOSIS — Z5111 Encounter for antineoplastic chemotherapy: Secondary | ICD-10-CM | POA: Diagnosis not present

## 2019-11-20 DIAGNOSIS — Z95828 Presence of other vascular implants and grafts: Secondary | ICD-10-CM

## 2019-11-20 LAB — COMPREHENSIVE METABOLIC PANEL
ALT: 21 U/L (ref 0–44)
AST: 18 U/L (ref 15–41)
Albumin: 3.5 g/dL (ref 3.5–5.0)
Alkaline Phosphatase: 97 U/L (ref 38–126)
Anion gap: 9 (ref 5–15)
BUN: 15 mg/dL (ref 8–23)
CO2: 23 mmol/L (ref 22–32)
Calcium: 8.4 mg/dL — ABNORMAL LOW (ref 8.9–10.3)
Chloride: 107 mmol/L (ref 98–111)
Creatinine, Ser: 0.78 mg/dL (ref 0.61–1.24)
GFR calc Af Amer: >60 mL/min (ref >60)
GFR calc non Af Amer: >60 mL/min (ref >60)
Glucose, Bld: 99 mg/dL (ref 70–99)
Potassium: 3.7 mmol/L (ref 3.5–5.1)
Sodium: 139 mmol/L (ref 135–145)
Total Bilirubin: 0.5 mg/dL (ref 0.3–1.2)
Total Protein: 6.2 g/dL — ABNORMAL LOW (ref 6.5–8.1)

## 2019-11-20 LAB — CBC WITH DIFFERENTIAL/PLATELET
Abs Immature Granulocytes: 0.05 10*3/uL (ref 0.00–0.07)
Basophils Absolute: 0 10*3/uL (ref 0.0–0.1)
Basophils Relative: 0 %
Eosinophils Absolute: 0.2 10*3/uL (ref 0.0–0.5)
Eosinophils Relative: 2 %
HCT: 38.5 % — ABNORMAL LOW (ref 39.0–52.0)
Hemoglobin: 12.8 g/dL — ABNORMAL LOW (ref 13.0–17.0)
Immature Granulocytes: 1 %
Lymphocytes Relative: 17 %
Lymphs Abs: 1.3 10*3/uL (ref 0.7–4.0)
MCH: 31.1 pg (ref 26.0–34.0)
MCHC: 33.2 g/dL (ref 30.0–36.0)
MCV: 93.4 fL (ref 80.0–100.0)
Monocytes Absolute: 0.5 10*3/uL (ref 0.1–1.0)
Monocytes Relative: 6 %
Neutro Abs: 5.8 10*3/uL (ref 1.7–7.7)
Neutrophils Relative %: 74 %
Platelets: 191 10*3/uL (ref 150–400)
RBC: 4.12 MIL/uL — ABNORMAL LOW (ref 4.22–5.81)
RDW: 15.2 % (ref 11.5–15.5)
WBC: 7.8 10*3/uL (ref 4.0–10.5)
nRBC: 0 % (ref 0.0–0.2)

## 2019-11-20 MED ORDER — HEPARIN SOD (PORK) LOCK FLUSH 100 UNIT/ML IV SOLN
500.0000 [IU] | Freq: Once | INTRAVENOUS | Status: AC
  Administered 2019-11-20: 500 [IU] via INTRAVENOUS
  Filled 2019-11-20: qty 5

## 2019-11-20 MED ORDER — SODIUM CHLORIDE 0.9% FLUSH
10.0000 mL | INTRAVENOUS | Status: DC | PRN
  Administered 2019-11-20: 10 mL via INTRAVENOUS
  Filled 2019-11-20: qty 10

## 2019-11-20 MED ORDER — SODIUM CHLORIDE 0.9 % IV SOLN
Freq: Once | INTRAVENOUS | Status: AC
  Filled 2019-11-20: qty 250

## 2019-11-24 ENCOUNTER — Other Ambulatory Visit: Payer: Self-pay

## 2019-11-24 DIAGNOSIS — C099 Malignant neoplasm of tonsil, unspecified: Secondary | ICD-10-CM

## 2019-11-25 ENCOUNTER — Inpatient Hospital Stay: Payer: Medicare Other

## 2019-11-25 ENCOUNTER — Encounter: Payer: Self-pay | Admitting: Oncology

## 2019-11-25 ENCOUNTER — Inpatient Hospital Stay: Payer: Medicare Other | Admitting: Oncology

## 2019-11-25 VITALS — BP 110/64 | HR 77 | Resp 16

## 2019-11-25 VITALS — BP 96/79 | HR 73 | Temp 98.0°F | Resp 16 | Ht 70.0 in | Wt 199.4 lb

## 2019-11-25 DIAGNOSIS — C099 Malignant neoplasm of tonsil, unspecified: Secondary | ICD-10-CM

## 2019-11-25 DIAGNOSIS — G893 Neoplasm related pain (acute) (chronic): Secondary | ICD-10-CM | POA: Diagnosis not present

## 2019-11-25 DIAGNOSIS — Z5112 Encounter for antineoplastic immunotherapy: Secondary | ICD-10-CM | POA: Diagnosis not present

## 2019-11-25 DIAGNOSIS — Z5111 Encounter for antineoplastic chemotherapy: Secondary | ICD-10-CM

## 2019-11-25 DIAGNOSIS — Z95828 Presence of other vascular implants and grafts: Secondary | ICD-10-CM

## 2019-11-25 LAB — COMPREHENSIVE METABOLIC PANEL
ALT: 17 U/L (ref 0–44)
AST: 18 U/L (ref 15–41)
Albumin: 3.5 g/dL (ref 3.5–5.0)
Alkaline Phosphatase: 84 U/L (ref 38–126)
Anion gap: 8 (ref 5–15)
BUN: 18 mg/dL (ref 8–23)
CO2: 23 mmol/L (ref 22–32)
Calcium: 8.5 mg/dL — ABNORMAL LOW (ref 8.9–10.3)
Chloride: 107 mmol/L (ref 98–111)
Creatinine, Ser: 0.88 mg/dL (ref 0.61–1.24)
GFR calc Af Amer: >60 mL/min (ref >60)
GFR calc non Af Amer: >60 mL/min (ref >60)
Glucose, Bld: 101 mg/dL — ABNORMAL HIGH (ref 70–99)
Potassium: 3.8 mmol/L (ref 3.5–5.1)
Sodium: 138 mmol/L (ref 135–145)
Total Bilirubin: 0.5 mg/dL (ref 0.3–1.2)
Total Protein: 6.7 g/dL (ref 6.5–8.1)

## 2019-11-25 LAB — CBC WITH DIFFERENTIAL/PLATELET
Abs Immature Granulocytes: 0.05 10*3/uL (ref 0.00–0.07)
Basophils Absolute: 0 10*3/uL (ref 0.0–0.1)
Basophils Relative: 0 %
Eosinophils Absolute: 0.2 10*3/uL (ref 0.0–0.5)
Eosinophils Relative: 2 %
HCT: 37.9 % — ABNORMAL LOW (ref 39.0–52.0)
Hemoglobin: 12.4 g/dL — ABNORMAL LOW (ref 13.0–17.0)
Immature Granulocytes: 1 %
Lymphocytes Relative: 19 %
Lymphs Abs: 1.5 10*3/uL (ref 0.7–4.0)
MCH: 30.6 pg (ref 26.0–34.0)
MCHC: 32.7 g/dL (ref 30.0–36.0)
MCV: 93.6 fL (ref 80.0–100.0)
Monocytes Absolute: 0.5 10*3/uL (ref 0.1–1.0)
Monocytes Relative: 7 %
Neutro Abs: 5.6 10*3/uL (ref 1.7–7.7)
Neutrophils Relative %: 71 %
Platelets: 243 10*3/uL (ref 150–400)
RBC: 4.05 MIL/uL — ABNORMAL LOW (ref 4.22–5.81)
RDW: 15.1 % (ref 11.5–15.5)
WBC: 7.8 10*3/uL (ref 4.0–10.5)
nRBC: 0 % (ref 0.0–0.2)

## 2019-11-25 MED ORDER — SODIUM CHLORIDE 0.9 % IV SOLN
640.0000 mg | Freq: Once | INTRAVENOUS | Status: AC
  Administered 2019-11-25: 640 mg via INTRAVENOUS
  Filled 2019-11-25: qty 64

## 2019-11-25 MED ORDER — HEPARIN SOD (PORK) LOCK FLUSH 100 UNIT/ML IV SOLN
500.0000 [IU] | Freq: Once | INTRAVENOUS | Status: AC
  Administered 2019-11-25: 500 [IU] via INTRAVENOUS
  Filled 2019-11-25: qty 5

## 2019-11-25 MED ORDER — HEPARIN SOD (PORK) LOCK FLUSH 100 UNIT/ML IV SOLN
INTRAVENOUS | Status: AC
  Filled 2019-11-25: qty 5

## 2019-11-25 MED ORDER — SODIUM CHLORIDE 0.9 % IV SOLN
175.0000 mg/m2 | Freq: Once | INTRAVENOUS | Status: AC
  Administered 2019-11-25: 390 mg via INTRAVENOUS
  Filled 2019-11-25: qty 65

## 2019-11-25 MED ORDER — SODIUM CHLORIDE 0.9 % IV SOLN
150.0000 mg | Freq: Once | INTRAVENOUS | Status: AC
  Administered 2019-11-25: 150 mg via INTRAVENOUS
  Filled 2019-11-25: qty 150

## 2019-11-25 MED ORDER — PALONOSETRON HCL INJECTION 0.25 MG/5ML
0.2500 mg | Freq: Once | INTRAVENOUS | Status: AC
  Administered 2019-11-25: 0.25 mg via INTRAVENOUS
  Filled 2019-11-25: qty 5

## 2019-11-25 MED ORDER — SODIUM CHLORIDE 0.9 % IV SOLN
200.0000 mg | Freq: Once | INTRAVENOUS | Status: AC
  Administered 2019-11-25: 200 mg via INTRAVENOUS
  Filled 2019-11-25: qty 8

## 2019-11-25 MED ORDER — SODIUM CHLORIDE 0.9 % IV SOLN
10.0000 mg | Freq: Once | INTRAVENOUS | Status: AC
  Administered 2019-11-25: 10 mg via INTRAVENOUS
  Filled 2019-11-25: qty 10

## 2019-11-25 MED ORDER — SODIUM CHLORIDE 0.9 % IV SOLN
Freq: Once | INTRAVENOUS | Status: AC
  Filled 2019-11-25: qty 250

## 2019-11-25 MED ORDER — SODIUM CHLORIDE 0.9% FLUSH
10.0000 mL | Freq: Once | INTRAVENOUS | Status: AC
  Administered 2019-11-25: 10 mL via INTRAVENOUS
  Filled 2019-11-25: qty 10

## 2019-11-25 MED ORDER — FAMOTIDINE IN NACL 20-0.9 MG/50ML-% IV SOLN
20.0000 mg | Freq: Once | INTRAVENOUS | Status: AC
  Administered 2019-11-25: 20 mg via INTRAVENOUS
  Filled 2019-11-25: qty 50

## 2019-11-25 MED ORDER — DIPHENHYDRAMINE HCL 50 MG/ML IJ SOLN
50.0000 mg | Freq: Once | INTRAMUSCULAR | Status: AC
  Administered 2019-11-25: 50 mg via INTRAVENOUS
  Filled 2019-11-25: qty 1

## 2019-11-25 NOTE — Progress Notes (Signed)
Pt fe

## 2019-11-26 ENCOUNTER — Inpatient Hospital Stay: Payer: Medicare Other

## 2019-11-26 ENCOUNTER — Other Ambulatory Visit: Payer: Self-pay

## 2019-11-26 ENCOUNTER — Other Ambulatory Visit: Payer: Self-pay | Admitting: *Deleted

## 2019-11-26 VITALS — BP 121/71 | HR 81 | Temp 96.8°F | Resp 18

## 2019-11-26 DIAGNOSIS — Z5111 Encounter for antineoplastic chemotherapy: Secondary | ICD-10-CM | POA: Diagnosis not present

## 2019-11-26 DIAGNOSIS — C099 Malignant neoplasm of tonsil, unspecified: Secondary | ICD-10-CM

## 2019-11-26 MED ORDER — HEPARIN SOD (PORK) LOCK FLUSH 100 UNIT/ML IV SOLN
500.0000 [IU] | Freq: Once | INTRAVENOUS | Status: AC
  Administered 2019-11-26: 500 [IU] via INTRAVENOUS
  Filled 2019-11-26: qty 5

## 2019-11-26 MED ORDER — PEGFILGRASTIM INJECTION 6 MG/0.6ML ~~LOC~~
6.0000 mg | PREFILLED_SYRINGE | Freq: Once | SUBCUTANEOUS | Status: AC
  Administered 2019-11-26: 6 mg via SUBCUTANEOUS
  Filled 2019-11-26: qty 0.6

## 2019-11-26 MED ORDER — SODIUM CHLORIDE 0.9 % IV SOLN
Freq: Once | INTRAVENOUS | Status: AC
  Filled 2019-11-26: qty 250

## 2019-11-26 MED ORDER — SODIUM CHLORIDE 0.9% FLUSH
10.0000 mL | Freq: Once | INTRAVENOUS | Status: AC
  Administered 2019-11-26: 10 mL via INTRAVENOUS
  Filled 2019-11-26: qty 10

## 2019-11-26 MED ORDER — HEPARIN SOD (PORK) LOCK FLUSH 100 UNIT/ML IV SOLN
INTRAVENOUS | Status: AC
  Filled 2019-11-26: qty 5

## 2019-11-27 ENCOUNTER — Inpatient Hospital Stay: Payer: Medicare Other | Attending: Oncology

## 2019-11-27 DIAGNOSIS — Z5111 Encounter for antineoplastic chemotherapy: Secondary | ICD-10-CM | POA: Insufficient documentation

## 2019-11-27 DIAGNOSIS — Z79899 Other long term (current) drug therapy: Secondary | ICD-10-CM | POA: Insufficient documentation

## 2019-11-27 DIAGNOSIS — R1310 Dysphagia, unspecified: Secondary | ICD-10-CM | POA: Insufficient documentation

## 2019-11-27 DIAGNOSIS — C771 Secondary and unspecified malignant neoplasm of intrathoracic lymph nodes: Secondary | ICD-10-CM | POA: Insufficient documentation

## 2019-11-27 DIAGNOSIS — C099 Malignant neoplasm of tonsil, unspecified: Secondary | ICD-10-CM | POA: Insufficient documentation

## 2019-11-27 DIAGNOSIS — R634 Abnormal weight loss: Secondary | ICD-10-CM | POA: Insufficient documentation

## 2019-11-27 DIAGNOSIS — G893 Neoplasm related pain (acute) (chronic): Secondary | ICD-10-CM | POA: Insufficient documentation

## 2019-11-27 NOTE — Progress Notes (Signed)
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  Patient receiving chemotherapy.   Spoke with patient via phone. Reports that his appetite is better.  Feels that pain on swallowing is better. Continues with taste alterations.  Reports ate 1/2 serving of chicken and dumplings last night for dinner.  Night before that had 1/2 [employer] sandwich.  Yesterday morning at biscuit and gravy.  Reports that he is trying to eat.  Drinking 1 premier protein shake daily.    Noted patient holding off on feeding tube.    Medications: reviewed  Labs: reviewed  Anthropometrics:   Weight 199 lb 6.4 oz on 3/30 (wt increase likely due to receiving fluids)  decreased from UBW of 220-225 since June 2020  10% weight loss in the last month, significant  NUTRITION DIAGNOSIS: Inadequate oral intake continues    INTERVENTION:  Noted patient does not want feeding tube.   Encouraged patient to try higher calorie shake (350 calorie) in addition to premier protein Reviewed ways to add extra calories and protein in diet.  Patient has contact information    MONITORING, EVALUATION, GOAL: Patient will consume adequate calories and protein to maintain nutrition during treatment   NEXT VISIT: April 22 phone f/u   Patrick Mendoza, [place], [place] Registered Dietitian [phone] (pager)

## 2019-11-27 NOTE — Progress Notes (Signed)
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 Richardson Landry for symptoms of sore throat and difficulty swallowing which she has been experiencing over the last 1 year.He underwent a comprehensive auto laryngoscopy exam which showed a firm enlarged right tonsillar mass with necrotic area extending onto the adjacent soft palate into the root of the uvula. Hypopharynx and larynx as well as nasopharynx was unable to be visualized due to gag reflex. This was biopsied and was consistent with nonkeratinizing invasive squamous cell carcinoma positive for p16.PDL 1 70%  PET CT scan showed large hypermetabolic right palatine tonsil mass which crosses the midline. Ipsilateral cervical nodal metastases. Bilateral pulmonary nodules including a dominant 1 cm hypermetabolic right lung base nodule. Multiplicity favor pulmonary metastases. Subcarinal and equivocal left hilar nodal metastases. Hypermetabolism involving the ascending colon. Question mild diverticulitis versus underlying colonic mass or polyp cannot be excluded  Patient underwent EBUS guided biopsy of hilar lymph nodes.  Biopsy showed squamous cell carcinoma.  However HPV/p16 status was uninterpretable.   Interval history-reports significant improvement in his pain during swallowing.  Food still does not taste good but he is trying to improve his  oral intake.  He has gained 3 pounds in the last 10 days.  Patient recently tripped on his steps and had a fall and states that his right chest wall hurts.  Pain is however tolerable at this time  ECOG PS- 1 Pain scale- 5 Opioid associated constipation- no  Review of systems- Review of Systems  Constitutional: Positive for malaise/fatigue. Negative for chills, fever and weight loss.  HENT: Negative for congestion, ear discharge and nosebleeds.   Eyes: Negative for blurred vision.  Respiratory: Negative for cough, hemoptysis, sputum production, shortness of breath and wheezing.        Right-sided chest wall pain  Cardiovascular: Negative for chest pain, palpitations, orthopnea and claudication.  Gastrointestinal: Negative for abdominal pain, blood in stool, constipation, diarrhea, heartburn, melena, nausea and vomiting.  Genitourinary: Negative for dysuria, flank pain, frequency, hematuria and urgency.  Musculoskeletal: Negative for back pain, joint pain and myalgias.  Skin: Negative for rash.  Neurological: Negative for dizziness, tingling, focal weakness, seizures, weakness and headaches.  Endo/Heme/Allergies: Does not bruise/bleed easily.  Psychiatric/Behavioral: Negative for depression and suicidal ideas. The patient does not have insomnia.       No Known Allergies   Past Medical History:  Diagnosis Date  . Asthma   . Lung nodules   . Tonsil cancer [facility])      Past Surgical History:  Procedure Laterality Date  . CERVICAL SPINE SURGERY    . LUMBAR SPINE SURGERY    . PORTA CATH INSERTION N/A 10/08/2019   Procedure: PORTA CATH INSERTION;  Surgeon: Katha Cabal, MD;  Location: [place] [facility];  Service: Cardiovascular;  Laterality: N/A;  . VIDEO BRONCHOSCOPY WITH ENDOBRONCHIAL NAVIGATION N/A 10/10/2019   Procedure: VIDEO BRONCHOSCOPY WITH ENDOBRONCHIAL NAVIGATION;  Surgeon: Tyler Pita,  MD;  Location: [facility] OR[facility];  Service: Pulmonary;  Laterality: N/A;  . VIDEO  BRONCHOSCOPY WITH ENDOBRONCHIAL ULTRASOUND N/A 10/10/2019   Procedure: VIDEO BRONCHOSCOPY WITH ENDOBRONCHIAL ULTRASOUND;  Surgeon: Tyler Pita, MD;  Location: [facility];  Service: Pulmonary;  Laterality: N/A;    Social History   Socioeconomic History  . Marital status: Widowed    Spouse name: Not on file  . Number of children: Not on file  . Years of education: Not on file  . Highest education level: Not on file  Occupational History  . Not on file  Tobacco Use  . Smoking status: Never Smoker  . Smokeless tobacco: Never Used  Substance and Sexual Activity  . Alcohol use: Never  . Drug use: Never  . Sexual activity: Not on file  Other Topics Concern  . Not on file  Social History Narrative  . Not on file   Social Determinants of Health   Financial Resource Strain:   . Difficulty of Paying Living Expenses:   Food Insecurity:   . Worried About Charity fundraiser in the Last Year:   . Arboriculturist in the Last Year:   Transportation Needs:   . Film/video editor (Medical):   Marland Kitchen Lack of Transportation (Non-Medical):   Physical Activity:   . Days of Exercise per Week:   . Minutes of Exercise per Session:   Stress:   . Feeling of Stress :   Social Connections:   . Frequency of Communication with Friends and Family:   . Frequency of Social Gatherings with Friends and Family:   . Attends Religious Services:   . Active Member of Clubs or Organizations:   . Attends Archivist Meetings:   Marland Kitchen Marital Status:   Intimate Partner Violence:   . Fear of Current or Ex-Partner:   . Emotionally Abused:   Marland Kitchen Physically Abused:   . Sexually Abused:     Family History  Problem Relation Age of Onset  . Cancer Mother 15       not sure what type  . Prostate cancer Father   . Rheum arthritis Father   . Healthy Sister   . Healthy Brother   . Healthy Sister   . Healthy Brother   . Healthy Brother   . Heart disease Brother   . Heart disease Brother       Current Outpatient Medications:  .  acetaminophen (TYLENOL) 500 MG tablet, Take 500-1,000 mg by mouth every 8 (eight) hours as needed (for pain.). , Disp: , Rfl:  .  albuterol (VENTOLIN HFA) 108 (90 Base) MCG/ACT inhaler, Inhale 2 puffs into the lungs every 6 (six) hours as needed (wheezing/shortness of breath). , Disp: , Rfl:  .  dexamethasone (DECADRON) 4 MG tablet, Take 2 tablets by mouth once a day for 3 days after chemo. Take with food., Disp: 30 tablet, Rfl: 1 .  lidocaine-prilocaine (EMLA) cream, Apply to affected area once, Disp: 30 g, Rfl: 3 .  LORazepam (ATIVAN) 0.5 MG tablet, Take 1 tablet (0.5 mg total) by mouth every 6 (six) hours as needed (Nausea or vomiting)., Disp: 30 tablet, Rfl: 0 .  magic mouthwash w/lidocaine SOLN, Take 5 mLs by mouth 4 (four) times daily as needed for mouth pain., Disp: 320 mL, Rfl: 1 .  mirtazapine (REMERON) 30 MG tablet, Take 1 tablet (30 mg total) by mouth at bedtime., Disp: 30 tablet, Rfl: 0 .  ondansetron (ZOFRAN) 8 MG tablet, Take 1 tablet (8  mg total) by mouth 2 (two) times daily as needed. Start on the third day after chemotherapy., Disp: 30 tablet, Rfl: 1 .  oxyCODONE (OXY IR/ROXICODONE) 5 MG immediate release tablet, Take 1 tablet (5 mg total) by mouth every 4 (four) hours as needed for severe pain., Disp: 60 tablet, Rfl: 0 .  prochlorperazine (COMPAZINE) 10 MG tablet, Take 1 tablet (10 mg total) by mouth every 6 (six) hours as needed (Nausea or vomiting)., Disp: 30 tablet, Rfl: 1 .  SYMBICORT 160-4.5 MCG/ACT inhaler, Inhale 2 puffs into the lungs 2 (two) times daily., Disp: , Rfl:  .  potassium chloride SA (KLOR-CON) 20 MEQ tablet, Take 1 tablet (20 mEq total) by mouth daily. (Patient not taking: Reported on 11/25/2019), Disp: 15 tablet, Rfl: 0  Physical exam:  Vitals:   11/25/19 0838  BP: 96/79  Pulse: 73  Resp: 16  Temp: 98 F (36.7 C)  TempSrc: Tympanic  Weight: 199 lb 6.4 oz (90.4 kg)  Height: 5\' 10"  (1.778 m)   Physical  Exam Constitutional:      General: He is not in acute distress. HENT:     Head: Normocephalic and atraumatic.  Eyes:     Pupils: Pupils are equal, round, and reactive to light.  Cardiovascular:     Rate and Rhythm: Normal rate and regular rhythm.     Heart sounds: Normal heart sounds.  Pulmonary:     Effort: Pulmonary effort is normal.     Breath sounds: Normal breath sounds.  Abdominal:     General: Bowel sounds are normal.     Palpations: Abdomen is soft.  Musculoskeletal:     Cervical back: Normal range of motion.  Skin:    General: Skin is warm and dry.  Neurological:     Mental Status: He is alert and oriented to person, place, and time.      CMP Latest Ref Rng & Units 11/25/2019  Glucose 70 - 99 mg/dL 101(H)  BUN 8 - 23 mg/dL 18  Creatinine 0.61 - 1.24 mg/dL 0.88  Sodium 135 - 145 mmol/L 138  Potassium 3.5 - 5.1 mmol/L 3.8  Chloride 98 - 111 mmol/L 107  CO2 22 - 32 mmol/L 23  Calcium 8.9 - 10.3 mg/dL 8.5(L)  Total Protein 6.5 - 8.1 g/dL 6.7  Total Bilirubin 0.3 - 1.2 mg/dL 0.5  Alkaline Phos 38 - 126 U/L 84  AST 15 - 41 U/L 18  ALT 0 - 44 U/L 17   CBC Latest Ref Rng & Units 11/25/2019  WBC 4.0 - 10.5 K/uL 7.8  Hemoglobin 13.0 - 17.0 g/dL 12.4(L)  Hematocrit 39.0 - 52.0 % 37.9(L)  Platelets 150 - 400 K/uL 243     Assessment and plan- Patient is a 69 y.o. 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  He is here for on treatment assessment prior to cycle 3 of carbotaxol Keytruda chemotherapy  Counts okay to proceed with cycle 3 of carbotaxol Keytruda chemotherapy today and he will come back on day 2 to receive udenycal.  I will plan to get CT soft tissue neck and CT chest with contrast in the interim to assess response to treatment  Patient has been getting IV fluids 1-2 times a week and this has been helping him.  We will plan to give him fluids on day 2 of chemotherapy and on  11/28/2019.  He will continue to receive fluids twice a  week next week and the week after.  Neoplasm related pain: Continue as needed oxycodone   Visit Diagnosis 1. 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 Encounter for antineoplastic chemotherapy   3. Encounter for antineoplastic immunotherapy   4. Neoplasm related pain      Dr. Randa Evens, MD, MPH [facility] at [facility] [phone] 11/27/2019 4:16 PM

## 2019-11-28 ENCOUNTER — Inpatient Hospital Stay: Payer: Medicare Other

## 2019-12-02 ENCOUNTER — Inpatient Hospital Stay: Payer: Medicare Other

## 2019-12-02 ENCOUNTER — Other Ambulatory Visit: Payer: Self-pay

## 2019-12-02 VITALS — BP 110/76 | HR 76 | Temp 97.0°F

## 2019-12-02 DIAGNOSIS — G893 Neoplasm related pain (acute) (chronic): Secondary | ICD-10-CM | POA: Diagnosis not present

## 2019-12-02 DIAGNOSIS — Z79899 Other long term (current) drug therapy: Secondary | ICD-10-CM | POA: Diagnosis not present

## 2019-12-02 DIAGNOSIS — C099 Malignant neoplasm of tonsil, unspecified: Secondary | ICD-10-CM

## 2019-12-02 DIAGNOSIS — C771 Secondary and unspecified malignant neoplasm of intrathoracic lymph nodes: Secondary | ICD-10-CM | POA: Diagnosis not present

## 2019-12-02 DIAGNOSIS — R1310 Dysphagia, unspecified: Secondary | ICD-10-CM | POA: Diagnosis not present

## 2019-12-02 DIAGNOSIS — Z5111 Encounter for antineoplastic chemotherapy: Secondary | ICD-10-CM | POA: Diagnosis not present

## 2019-12-02 DIAGNOSIS — R634 Abnormal weight loss: Secondary | ICD-10-CM | POA: Diagnosis not present

## 2019-12-02 MED ORDER — HEPARIN SOD (PORK) LOCK FLUSH 100 UNIT/ML IV SOLN
500.0000 [IU] | Freq: Once | INTRAVENOUS | Status: AC
  Administered 2019-12-02: 500 [IU] via INTRAVENOUS
  Filled 2019-12-02: qty 5

## 2019-12-02 MED ORDER — SODIUM CHLORIDE 0.9% FLUSH
10.0000 mL | INTRAVENOUS | Status: DC | PRN
  Administered 2019-12-02: 10 mL via INTRAVENOUS
  Filled 2019-12-02: qty 10

## 2019-12-02 MED ORDER — HEPARIN SOD (PORK) LOCK FLUSH 100 UNIT/ML IV SOLN
INTRAVENOUS | Status: AC
  Filled 2019-12-02: qty 5

## 2019-12-02 MED ORDER — SODIUM CHLORIDE 0.9 % IV SOLN
Freq: Once | INTRAVENOUS | Status: AC
  Filled 2019-12-02: qty 250

## 2019-12-05 ENCOUNTER — Inpatient Hospital Stay: Payer: Medicare Other

## 2019-12-05 ENCOUNTER — Other Ambulatory Visit: Payer: Self-pay

## 2019-12-05 DIAGNOSIS — C099 Malignant neoplasm of tonsil, unspecified: Secondary | ICD-10-CM

## 2019-12-05 DIAGNOSIS — Z5111 Encounter for antineoplastic chemotherapy: Secondary | ICD-10-CM | POA: Diagnosis not present

## 2019-12-05 MED ORDER — HEPARIN SOD (PORK) LOCK FLUSH 100 UNIT/ML IV SOLN
INTRAVENOUS | Status: AC
  Filled 2019-12-05: qty 5

## 2019-12-05 MED ORDER — SODIUM CHLORIDE 0.9 % IV SOLN
Freq: Once | INTRAVENOUS | Status: AC
  Filled 2019-12-05: qty 250

## 2019-12-05 MED ORDER — HEPARIN SOD (PORK) LOCK FLUSH 100 UNIT/ML IV SOLN
500.0000 [IU] | Freq: Once | INTRAVENOUS | Status: AC
  Administered 2019-12-05: 500 [IU] via INTRAVENOUS
  Filled 2019-12-05: qty 5

## 2019-12-05 MED ORDER — SODIUM CHLORIDE 0.9% FLUSH
10.0000 mL | Freq: Once | INTRAVENOUS | Status: AC
  Administered 2019-12-05: 10 mL via INTRAVENOUS
  Filled 2019-12-05: qty 10

## 2019-12-08 ENCOUNTER — Other Ambulatory Visit: Payer: Self-pay

## 2019-12-08 ENCOUNTER — Ambulatory Visit
Admission: RE | Admit: 2019-12-08 | Discharge: 2019-12-08 | Disposition: A | Discharge status: Home / Self Care | Payer: Medicare Other | Source: Ambulatory Visit | Attending: Oncology | Admitting: Oncology

## 2019-12-08 DIAGNOSIS — C099 Malignant neoplasm of tonsil, unspecified: Secondary | ICD-10-CM | POA: Diagnosis not present

## 2019-12-08 MED ORDER — IOHEXOL 300 MG/ML  SOLN
75.0000 mL | Freq: Once | INTRAMUSCULAR | Status: AC | PRN
  Administered 2019-12-08: 75 mL via INTRAVENOUS

## 2019-12-09 ENCOUNTER — Inpatient Hospital Stay: Payer: Medicare Other

## 2019-12-09 ENCOUNTER — Ambulatory Visit: Payer: Medicare Other

## 2019-12-09 VITALS — BP 107/76 | HR 70

## 2019-12-09 DIAGNOSIS — Z95828 Presence of other vascular implants and grafts: Secondary | ICD-10-CM

## 2019-12-09 DIAGNOSIS — Z5111 Encounter for antineoplastic chemotherapy: Secondary | ICD-10-CM | POA: Diagnosis not present

## 2019-12-09 DIAGNOSIS — C099 Malignant neoplasm of tonsil, unspecified: Secondary | ICD-10-CM

## 2019-12-09 MED ORDER — SODIUM CHLORIDE 0.9% FLUSH
10.0000 mL | INTRAVENOUS | Status: DC | PRN
  Administered 2019-12-09: 10 mL via INTRAVENOUS
  Filled 2019-12-09: qty 10

## 2019-12-09 MED ORDER — HEPARIN SOD (PORK) LOCK FLUSH 100 UNIT/ML IV SOLN
500.0000 [IU] | Freq: Once | INTRAVENOUS | Status: AC
  Administered 2019-12-09: 500 [IU] via INTRAVENOUS
  Filled 2019-12-09: qty 5

## 2019-12-09 MED ORDER — SODIUM CHLORIDE 0.9 % IV SOLN
Freq: Once | INTRAVENOUS | Status: AC
  Filled 2019-12-09: qty 250

## 2019-12-09 NOTE — Progress Notes (Signed)
Pharmacist Chemotherapy Monitoring - Follow Up Assessment    I verify that I have reviewed each item in the below checklist:  . Regimen for the patient is scheduled for the appropriate day and plan matches scheduled date. Marland Kitchen Appropriate non-routine labs are ordered dependent on drug ordered. . If applicable, additional medications reviewed and ordered per protocol based on lifetime cumulative doses and/or treatment regimen.   Plan for follow-up and/or issues identified: No . I-vent associated with next due treatment: Yes . MD and/or nursing notified: No  Patrick Mendoza 12/09/2019 8:32 AM

## 2019-12-12 ENCOUNTER — Inpatient Hospital Stay: Payer: Medicare Other

## 2019-12-12 VITALS — BP 110/73 | HR 75 | Temp 97.6°F | Resp 20

## 2019-12-12 DIAGNOSIS — C099 Malignant neoplasm of tonsil, unspecified: Secondary | ICD-10-CM

## 2019-12-12 DIAGNOSIS — Z5111 Encounter for antineoplastic chemotherapy: Secondary | ICD-10-CM | POA: Diagnosis not present

## 2019-12-12 MED ORDER — HEPARIN SOD (PORK) LOCK FLUSH 100 UNIT/ML IV SOLN
500.0000 [IU] | Freq: Once | INTRAVENOUS | Status: AC
  Administered 2019-12-12: 500 [IU] via INTRAVENOUS
  Filled 2019-12-12: qty 5

## 2019-12-12 MED ORDER — SODIUM CHLORIDE 0.9 % IV SOLN
Freq: Once | INTRAVENOUS | Status: AC
  Filled 2019-12-12: qty 250

## 2019-12-12 MED ORDER — HEPARIN SOD (PORK) LOCK FLUSH 100 UNIT/ML IV SOLN
INTRAVENOUS | Status: AC
  Filled 2019-12-12: qty 5

## 2019-12-16 ENCOUNTER — Other Ambulatory Visit: Payer: Self-pay | Admitting: *Deleted

## 2019-12-16 ENCOUNTER — Inpatient Hospital Stay: Payer: Medicare Other

## 2019-12-16 ENCOUNTER — Encounter: Payer: Self-pay | Admitting: Oncology

## 2019-12-16 ENCOUNTER — Other Ambulatory Visit: Payer: Self-pay

## 2019-12-16 ENCOUNTER — Inpatient Hospital Stay (HOSPITAL_BASED_OUTPATIENT_CLINIC_OR_DEPARTMENT_OTHER): Payer: Medicare Other | Admitting: Oncology

## 2019-12-16 VITALS — BP 114/81 | HR 64 | Temp 96.6°F | Resp 16 | Wt 197.1 lb

## 2019-12-16 DIAGNOSIS — Z5111 Encounter for antineoplastic chemotherapy: Secondary | ICD-10-CM | POA: Diagnosis not present

## 2019-12-16 DIAGNOSIS — C099 Malignant neoplasm of tonsil, unspecified: Secondary | ICD-10-CM

## 2019-12-16 DIAGNOSIS — Z5112 Encounter for antineoplastic immunotherapy: Secondary | ICD-10-CM

## 2019-12-16 LAB — CBC WITH DIFFERENTIAL/PLATELET
Abs Immature Granulocytes: 0.32 10*3/uL — ABNORMAL HIGH (ref 0.00–0.07)
Basophils Absolute: 0 10*3/uL (ref 0.0–0.1)
Basophils Relative: 0 %
Eosinophils Absolute: 0 10*3/uL (ref 0.0–0.5)
Eosinophils Relative: 0 %
HCT: 37.4 % — ABNORMAL LOW (ref 39.0–52.0)
Hemoglobin: 12.4 g/dL — ABNORMAL LOW (ref 13.0–17.0)
Immature Granulocytes: 2 %
Lymphocytes Relative: 11 %
Lymphs Abs: 2 10*3/uL (ref 0.7–4.0)
MCH: 31.2 pg (ref 26.0–34.0)
MCHC: 33.2 g/dL (ref 30.0–36.0)
MCV: 94.2 fL (ref 80.0–100.0)
Monocytes Absolute: 1.3 10*3/uL — ABNORMAL HIGH (ref 0.1–1.0)
Monocytes Relative: 7 %
Neutro Abs: 14.5 10*3/uL — ABNORMAL HIGH (ref 1.7–7.7)
Neutrophils Relative %: 80 %
Platelets: 274 10*3/uL (ref 150–400)
RBC: 3.97 MIL/uL — ABNORMAL LOW (ref 4.22–5.81)
RDW: 15.8 % — ABNORMAL HIGH (ref 11.5–15.5)
WBC: 18.1 10*3/uL — ABNORMAL HIGH (ref 4.0–10.5)
nRBC: 0 % (ref 0.0–0.2)

## 2019-12-16 LAB — COMPREHENSIVE METABOLIC PANEL
ALT: 17 U/L (ref 0–44)
AST: 19 U/L (ref 15–41)
Albumin: 3.8 g/dL (ref 3.5–5.0)
Alkaline Phosphatase: 80 U/L (ref 38–126)
Anion gap: 11 (ref 5–15)
BUN: 17 mg/dL (ref 8–23)
CO2: 26 mmol/L (ref 22–32)
Calcium: 9.3 mg/dL (ref 8.9–10.3)
Chloride: 103 mmol/L (ref 98–111)
Creatinine, Ser: 0.9 mg/dL (ref 0.61–1.24)
GFR calc Af Amer: >60 mL/min (ref >60)
GFR calc non Af Amer: >60 mL/min (ref >60)
Glucose, Bld: 106 mg/dL — ABNORMAL HIGH (ref 70–99)
Potassium: 3.6 mmol/L (ref 3.5–5.1)
Sodium: 140 mmol/L (ref 135–145)
Total Bilirubin: 0.4 mg/dL (ref 0.3–1.2)
Total Protein: 6.9 g/dL (ref 6.5–8.1)

## 2019-12-16 MED ORDER — SODIUM CHLORIDE 0.9 % IV SOLN
10.0000 mg | Freq: Once | INTRAVENOUS | Status: AC
  Administered 2019-12-16: 10 mg via INTRAVENOUS
  Filled 2019-12-16: qty 10

## 2019-12-16 MED ORDER — SODIUM CHLORIDE 0.9 % IV SOLN
580.0000 mg | Freq: Once | INTRAVENOUS | Status: AC
  Administered 2019-12-16: 580 mg via INTRAVENOUS
  Filled 2019-12-16: qty 58

## 2019-12-16 MED ORDER — HEPARIN SOD (PORK) LOCK FLUSH 100 UNIT/ML IV SOLN
INTRAVENOUS | Status: AC
  Filled 2019-12-16: qty 5

## 2019-12-16 MED ORDER — MIRTAZAPINE 30 MG PO TABS: 30.0000 mg | ORAL_TABLET | Freq: Every day | ORAL | 0 refills | Status: DC

## 2019-12-16 MED ORDER — FAMOTIDINE IN NACL 20-0.9 MG/50ML-% IV SOLN
20.0000 mg | Freq: Once | INTRAVENOUS | Status: AC
  Administered 2019-12-16: 20 mg via INTRAVENOUS
  Filled 2019-12-16: qty 50

## 2019-12-16 MED ORDER — HEPARIN SOD (PORK) LOCK FLUSH 100 UNIT/ML IV SOLN
500.0000 [IU] | Freq: Once | INTRAVENOUS | Status: DC
  Filled 2019-12-16: qty 5

## 2019-12-16 MED ORDER — SODIUM CHLORIDE 0.9 % IV SOLN
200.0000 mg | Freq: Once | INTRAVENOUS | Status: AC
  Administered 2019-12-16: 200 mg via INTRAVENOUS
  Filled 2019-12-16: qty 8

## 2019-12-16 MED ORDER — OXYCODONE HCL 5 MG PO TABS: 5.0000 mg | ORAL_TABLET | ORAL | 0 refills | Status: DC | PRN

## 2019-12-16 MED ORDER — HEPARIN SOD (PORK) LOCK FLUSH 100 UNIT/ML IV SOLN
500.0000 [IU] | Freq: Once | INTRAVENOUS | Status: AC | PRN
  Administered 2019-12-16: 500 [IU]
  Filled 2019-12-16: qty 5

## 2019-12-16 MED ORDER — SODIUM CHLORIDE 0.9 % IV SOLN
Freq: Once | INTRAVENOUS | Status: AC
  Filled 2019-12-16: qty 250

## 2019-12-16 MED ORDER — DIPHENHYDRAMINE HCL 50 MG/ML IJ SOLN
50.0000 mg | Freq: Once | INTRAMUSCULAR | Status: AC
  Administered 2019-12-16: 50 mg via INTRAVENOUS
  Filled 2019-12-16: qty 1

## 2019-12-16 MED ORDER — SODIUM CHLORIDE 0.9% FLUSH
10.0000 mL | Freq: Once | INTRAVENOUS | Status: AC
  Administered 2019-12-16: 10 mL via INTRAVENOUS
  Filled 2019-12-16: qty 10

## 2019-12-16 MED ORDER — SODIUM CHLORIDE 0.9 % IV SOLN
150.0000 mg | Freq: Once | INTRAVENOUS | Status: AC
  Administered 2019-12-16: 150 mg via INTRAVENOUS
  Filled 2019-12-16: qty 150

## 2019-12-16 MED ORDER — PALONOSETRON HCL INJECTION 0.25 MG/5ML
0.2500 mg | Freq: Once | INTRAVENOUS | Status: AC
  Administered 2019-12-16: 0.25 mg via INTRAVENOUS
  Filled 2019-12-16: qty 5

## 2019-12-16 MED ORDER — PROCHLORPERAZINE MALEATE 10 MG PO TABS: 10.0000 mg | ORAL_TABLET | Freq: Four times a day (QID) | ORAL | 1 refills | Status: DC | PRN

## 2019-12-16 MED ORDER — SODIUM CHLORIDE 0.9 % IV SOLN: 634.5000 mg | Freq: Once | INTRAVENOUS | Status: DC

## 2019-12-16 MED ORDER — DEXAMETHASONE 4 MG PO TABS: ORAL_TABLET | ORAL | 0 refills | Status: DC

## 2019-12-16 MED ORDER — ONDANSETRON HCL 8 MG PO TABS: 8.0000 mg | ORAL_TABLET | Freq: Two times a day (BID) | ORAL | 1 refills | Status: DC | PRN

## 2019-12-16 MED ORDER — SODIUM CHLORIDE 0.9 % IV SOLN
175.0000 mg/m2 | Freq: Once | INTRAVENOUS | Status: AC
  Administered 2019-12-16: 390 mg via INTRAVENOUS
  Filled 2019-12-16: qty 65

## 2019-12-16 MED ORDER — LORAZEPAM 0.5 MG PO TABS: 0.5000 mg | ORAL_TABLET | Freq: Four times a day (QID) | ORAL | 0 refills | Status: DC | PRN

## 2019-12-16 NOTE — Progress Notes (Signed)
Pt states mouth not sore like it was and able to eat but it is bland foods. He still feels weak in the legs at time-but does not use walker or cane. He would like results of MRi from last week.

## 2019-12-17 ENCOUNTER — Inpatient Hospital Stay: Payer: Medicare Other

## 2019-12-18 ENCOUNTER — Inpatient Hospital Stay: Payer: Medicare Other

## 2019-12-18 ENCOUNTER — Encounter: Payer: Self-pay | Admitting: Oncology

## 2019-12-18 NOTE — Progress Notes (Signed)
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  Patient receiving chemotherapy.   Spoke with patient via phone for nutrition follow-up.  Patient reports that his appetite is a lot better.  "I am eating more than I have been."  Reports mouth is not as sore.  Continues to drink 1 premier shake a day or every other day.  Reports yesterday at chicken and dumplings and hamburger.      Medications: reviewed  Labs: reviewed  Anthropometrics:   Weight 197 lb 1.6 oz on 4/20 decreased from 199 lb on 3/30 (had received IV fluids)  UBW 220-225 since June 2020   NUTRITION DIAGNOSIS: Inadequate oral intake continues   INTERVENTION:  Encouraged patient to continue eating high calories, high protein foods.  Patient has contact information   MONITORING, EVALUATION, GOAL: Patient will consume adequate calories and protein to maintain nutrition during treatment.    NEXT VISIT: May 20 phone f/u  Woodford Strege B. [name], [place], [place] Registered Dietitian [phone] (pager)

## 2019-12-18 NOTE — Progress Notes (Signed)
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 Richardson Landry for symptoms of sore throat and difficulty swallowing which she has been experiencing over the last 1 year.He underwent a comprehensive auto laryngoscopy exam which showed a firm enlarged right tonsillar mass with necrotic area extending onto the adjacent soft palate into the root of the uvula. Hypopharynx and larynx as well as nasopharynx was unable to be visualized due to gag reflex. This was biopsied and was consistent with nonkeratinizing invasive squamous cell carcinoma positive for p16.PDL 1 70%  PET CT scan showed large hypermetabolic right palatine tonsil mass which crosses the midline. Ipsilateral cervical nodal metastases. Bilateral pulmonary nodules including a dominant 1 cm hypermetabolic right lung base nodule. Multiplicity favor pulmonary metastases. Subcarinal and equivocal left hilar nodal metastases. Hypermetabolism involving the ascending colon. Question mild diverticulitis versus underlying colonic mass or polyp cannot be excluded  Patient underwent EBUS guided biopsy ofhilarlymph nodes. Biopsy showed squamous cell carcinoma. However HPV/p16 status was uninterpretable.   Interval history- tolerating chemotherapy well. Pain during swallowing has almost resolved. Feels fatigued. Appetite is fair  ECOG PS-  1 Pain scale- 0   Review of systems- Review of Systems  Constitutional: Positive for malaise/fatigue. Negative for chills, fever and weight loss.  HENT: Negative for congestion, ear discharge and nosebleeds.   Eyes: Negative for blurred vision.  Respiratory: Negative for cough, hemoptysis, sputum production, shortness of breath and wheezing.   Cardiovascular: Negative for chest pain, palpitations, orthopnea and claudication.  Gastrointestinal: Negative for abdominal pain, blood in stool, constipation, diarrhea, heartburn, melena, nausea and vomiting.  Genitourinary: Negative for dysuria, flank pain, frequency, hematuria and urgency.  Musculoskeletal: Negative for back pain, joint pain and myalgias.  Skin: Negative for rash.  Neurological: Negative for dizziness, tingling, focal weakness, seizures, weakness and headaches.  Endo/Heme/Allergies: Does not bruise/bleed easily.  Psychiatric/Behavioral: Negative for depression and suicidal ideas. The patient does not have insomnia.       No Known Allergies   Past Medical History:  Diagnosis Date  . Asthma   . Lung nodules   . Tonsil cancer [facility])      Past Surgical History:  Procedure Laterality Date  . CERVICAL SPINE SURGERY    . LUMBAR SPINE SURGERY    . PORTA CATH INSERTION N/A 10/08/2019   Procedure: PORTA CATH INSERTION;  Surgeon: Katha Cabal, MD;  Location: [facility];  Service: Cardiovascular;  Laterality: N/A;  . VIDEO BRONCHOSCOPY WITH ENDOBRONCHIAL NAVIGATION N/A 10/10/2019   Procedure: VIDEO BRONCHOSCOPY WITH ENDOBRONCHIAL NAVIGATION;  Surgeon: Tyler Pita, MD;  Location: [facility];  Service: Pulmonary;  Laterality: N/A;  . VIDEO BRONCHOSCOPY WITH ENDOBRONCHIAL ULTRASOUND N/A 10/10/2019   Procedure: VIDEO BRONCHOSCOPY WITH ENDOBRONCHIAL ULTRASOUND;  Surgeon: Tyler Pita, MD;  Location: [facility];  Service: Pulmonary;  Laterality: N/A;    Social History   Socioeconomic History  . Marital  status: Widowed  Spouse name: Not on file  . Number of children: Not on file  . Years of education: Not on file  . Highest education level: Not on file  Occupational History  . Not on file  Tobacco Use  . Smoking status: Never Smoker  . Smokeless tobacco: Never Used  Substance and Sexual Activity  . Alcohol use: Never  . Drug use: Never  . Sexual activity: Not on file  Other Topics Concern  . Not on file  Social History Narrative  . Not on file   Social Determinants of Health   Financial Resource Strain:   . Difficulty of Paying Living Expenses:   Food Insecurity:   . Worried About Charity fundraiser in the Last Year:   . Arboriculturist in the Last Year:   Transportation Needs:   . Film/video editor (Medical):   Marland Kitchen Lack of Transportation (Non-Medical):   Physical Activity:   . Days of Exercise per Week:   . Minutes of Exercise per Session:   Stress:   . Feeling of Stress :   Social Connections:   . Frequency of Communication with Friends and Family:   . Frequency of Social Gatherings with Friends and Family:   . Attends Religious Services:   . Active Member of Clubs or Organizations:   . Attends Archivist Meetings:   Marland Kitchen Marital Status:   Intimate Partner Violence:   . Fear of Current or Ex-Partner:   . Emotionally Abused:   Marland Kitchen Physically Abused:   . Sexually Abused:     Family History  Problem Relation Age of Onset  . Cancer Mother 20       not sure what type  . Prostate cancer Father   . Rheum arthritis Father   . Healthy Sister   . Healthy Brother   . Healthy Sister   . Healthy Brother   . Healthy Brother   . Heart disease Brother   . Heart disease Brother      Current Outpatient Medications:  .  acetaminophen (TYLENOL) 500 MG tablet, Take 500-1,000 mg by mouth every 8 (eight) hours as needed (for pain.). , Disp: , Rfl:  .  albuterol (VENTOLIN HFA) 108 (90 Base) MCG/ACT inhaler, Inhale 2 puffs into the lungs every 6 (six) hours as  needed (wheezing/shortness of breath). , Disp: , Rfl:  .  lidocaine-prilocaine (EMLA) cream, Apply to affected area once, Disp: 30 g, Rfl: 3 .  mirtazapine (REMERON) 30 MG tablet, Take 1 tablet (30 mg total) by mouth at bedtime., Disp: 30 tablet, Rfl: 0 .  ondansetron (ZOFRAN) 8 MG tablet, Take 1 tablet (8 mg total) by mouth 2 (two) times daily as needed. Start on the third day after chemotherapy., Disp: 30 tablet, Rfl: 1 .  prochlorperazine (COMPAZINE) 10 MG tablet, Take 1 tablet (10 mg total) by mouth every 6 (six) hours as needed (Nausea or vomiting)., Disp: 30 tablet, Rfl: 1 .  SYMBICORT 160-4.5 MCG/ACT inhaler, Inhale 2 puffs into the lungs 2 (two) times daily., Disp: , Rfl:  .  dexamethasone (DECADRON) 4 MG tablet, Take 2 tablets by mouth once a day for 3 days after chemo. Take with food., Disp: 30 tablet, Rfl: 0 .  LORazepam (ATIVAN) 0.5 MG tablet, Take 1 tablet (0.5 mg total) by mouth every 6 (six) hours as needed (Nausea or vomiting)., Disp: 30 tablet, Rfl: 0 .  magic mouthwash w/lidocaine SOLN, Take 5 mLs by mouth 4 (four) times daily as  needed for mouth pain. (Patient not taking: Reported on 12/16/2019), Disp: 320 mL, Rfl: 1 .  oxyCODONE (OXY IR/ROXICODONE) 5 MG immediate release tablet, Take 1 tablet (5 mg total) by mouth every 4 (four) hours as needed for severe pain., Disp: 60 tablet, Rfl: 0 .  potassium chloride SA (KLOR-CON) 20 MEQ tablet, Take 1 tablet (20 mEq total) by mouth daily. (Patient not taking: Reported on 11/25/2019), Disp: 15 tablet, Rfl: 0  Physical exam:  Vitals:   12/16/19 0841  BP: 114/81  Pulse: 64  Resp: 16  Temp: (!) 96.6 F (35.9 C)  TempSrc: Tympanic  Weight: 197 lb 1.6 oz (89.4 kg)   Physical Exam Constitutional:      General: He is not in acute distress. Cardiovascular:     Heart sounds: Normal heart sounds.  Pulmonary:     Effort: Pulmonary effort is normal.  Skin:    General: Skin is warm and dry.  Neurological:     Mental Status: He is alert  and oriented to person, place, and time.      CMP Latest Ref Rng & Units 12/16/2019  Glucose 70 - 99 mg/dL 106(H)  BUN 8 - 23 mg/dL 17  Creatinine 0.61 - 1.24 mg/dL 0.90  Sodium 135 - 145 mmol/L 140  Potassium 3.5 - 5.1 mmol/L 3.6  Chloride 98 - 111 mmol/L 103  CO2 22 - 32 mmol/L 26  Calcium 8.9 - 10.3 mg/dL 9.3  Total Protein 6.5 - 8.1 g/dL 6.9  Total Bilirubin 0.3 - 1.2 mg/dL 0.4  Alkaline Phos 38 - 126 U/L 80  AST 15 - 41 U/L 19  ALT 0 - 44 U/L 17   CBC Latest Ref Rng & Units 12/16/2019  WBC 4.0 - 10.5 K/uL 18.1(H)  Hemoglobin 13.0 - 17.0 g/dL 12.4(L)  Hematocrit 39.0 - 52.0 % 37.4(L)  Platelets 150 - 400 K/uL 274    No images are attached to the encounter.  CT SOFT TISSUE NECK W CONTRAST  Result Date: 12/08/2019 CLINICAL DATA:  Squamous cell carcinoma palatine tonsil EXAM: CT NECK WITH CONTRAST TECHNIQUE: Multidetector CT imaging of the neck was performed using the standard protocol following the bolus administration of intravenous contrast. CONTRAST:  51mL OMNIPAQUE IOHEXOL 300 MG/ML  SOLN COMPARISON:  PET CT 09/30/2019 FINDINGS: Pharynx and larynx: Interval resolution of right palatine tonsil mass. Mild hypertrophy left tonsil with associated tonsilliths unchanged from the prior study. Epiglottis and larynx normal. Salivary glands: No inflammation, mass, or stone. Thyroid: Normal Lymph nodes: Interval improvement in cervical lymph nodes on the right. Right level 2 lymph node now measures 19 mm, previously 28 mm. Posterior lymph nodes on the right have improved. Multiple posterior lymph nodes on the right measuring up to 9.7 mm have improved. No adenopathy in the left neck. Vascular: Normal vascular enhancement. Port-A-Cath right jugular vein extending into the SVC. Limited intracranial: Negative Visualized orbits: Normal orbit bilaterally. Mastoids and visualized paranasal sinuses: Mucosal edema right frontal sinus and bilateral ethmoid sinuses. Mastoid sinus clear bilaterally.  Air-fluid level left sphenoid sinus. Skeleton: Cervical spondylosis.  No acute skeletal abnormality. Upper chest: Chest CT reported separately from today Other: None IMPRESSION: Interval resolution of previously noted mass in the right tonsil. Improvement in pathologic lymph nodes in the right neck compared with the prior PET CT Electronically Signed   By: Franchot Gallo M.D.   On: 12/08/2019 11:20   CT Chest W Contrast  Result Date: 12/08/2019 CLINICAL DATA:  Squamous cell tonsillar carcinoma with lung and  lymph node metastases. Assess treatment response. EXAM: CT CHEST WITH CONTRAST TECHNIQUE: Multidetector CT imaging of the chest was performed during intravenous contrast administration. CONTRAST:  55mL OMNIPAQUE IOHEXOL 300 MG/ML  SOLN COMPARISON:  10/07/2019 and PET 09/30/2019. FINDINGS: Cardiovascular: Right IJ Port-A-Cath terminates at the SVC RA junction. Atherosclerotic calcification of the aorta. Heart is at the upper limits of normal in size. No pericardial effusion. Mediastinum/Nodes: No pathologically enlarged mediastinal, hilar or axillary lymph nodes. Esophagus is unremarkable. Lungs/Pleura: Scarring in the right middle lobe, lingula and both lower lobes. Anterior right lower lobe nodule has decreased in size, now measuring 3 mm (5/75), compared to 10 mm on 10/07/2019. Other previously seen pulmonary nodules have resolved in the interval. Very mild upper lobe centrilobular nodularity is new from the prior exam. No pleural fluid. Airway is unremarkable. Upper Abdomen: Subcentimeter low-attenuation lesion in the peripheral left hepatic lobe is likely unchanged and too small to characterize. Visualized portions of the liver, gallbladder and right adrenal gland are unremarkable. Slight nodular thickening of the left adrenal gland. Visualized portions of the kidneys, spleen, pancreas, stomach and bowel are grossly unremarkable. No upper abdominal adenopathy. Porta hepatis lymph node measures 9 mm,  stable. Musculoskeletal: Degenerative changes in the spine. No worrisome lytic or sclerotic lesions. IMPRESSION: 1. Improving pulmonary metastatic disease with a single residual tiny right lower lobe nodule. 2. Resolved adenopathy. 3. Very mild upper lobe ill-defined centrilobular nodularity, new from prior exam. Findings may be due to an infectious bronchiolitis. 4. CT neck dictated separately. 5.  Aortic atherosclerosis (ICD10-I70.0). Electronically Signed   By: Lorin Picket M.D.   On: 12/08/2019 09:33     Assessment and plan- Patient is a 69 y.o. 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 His wbc is elevated at 18. Predominant neutrophilia. I will hold off on growth factor support with this cycle  I have reviewed CT neck and chest images independently. After 3 cycles, patient has had an excellent response to treatment so far. Right tonsillar mass has resolved. Neck adenopathy is smaller. Mediastinal adenopathy has resolved. Multiple lung nodules have resolved and a small 3 mm RLL nodule remains  I will plan to give him 6 cycles of chemo/immunotherapy if he can tolerate it. I will plan to continue keytruda monotherapy beyond that. No immediate plans to give RT to neck area   Visit Diagnosis 1. Encounter for antineoplastic chemotherapy   2. 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 Encounter for antineoplastic immunotherapy      Dr. Randa Evens, MD, MPH [facility] [phone] 12/18/2019 12:51 PM

## 2019-12-30 NOTE — Progress Notes (Signed)
Pharmacist Chemotherapy Monitoring - Follow Up Assessment    I verify that I have reviewed each item in the below checklist:  . Regimen for the patient is scheduled for the appropriate day and plan matches scheduled date. Marland Kitchen Appropriate non-routine labs are ordered dependent on drug ordered. . If applicable, additional medications reviewed and ordered per protocol based on lifetime cumulative doses and/or treatment regimen.   Plan for follow-up and/or issues identified: No . I-vent associated with next due treatment: Yes . MD and/or nursing notified: No  [name] 12/30/2019 8:38 AM2

## 2020-01-06 ENCOUNTER — Inpatient Hospital Stay: Payer: Medicare Other | Attending: Oncology

## 2020-01-06 ENCOUNTER — Encounter: Payer: Self-pay | Admitting: Oncology

## 2020-01-06 ENCOUNTER — Inpatient Hospital Stay: Payer: Medicare Other | Admitting: Oncology

## 2020-01-06 ENCOUNTER — Other Ambulatory Visit: Payer: Self-pay

## 2020-01-06 ENCOUNTER — Inpatient Hospital Stay: Payer: Medicare Other

## 2020-01-06 VITALS — BP 109/79 | HR 77 | Temp 96.4°F | Resp 16 | Wt 193.0 lb

## 2020-01-06 VITALS — BP 111/71 | HR 75 | Resp 16

## 2020-01-06 DIAGNOSIS — Z5111 Encounter for antineoplastic chemotherapy: Secondary | ICD-10-CM

## 2020-01-06 DIAGNOSIS — Z5112 Encounter for antineoplastic immunotherapy: Secondary | ICD-10-CM | POA: Diagnosis present

## 2020-01-06 DIAGNOSIS — C099 Malignant neoplasm of tonsil, unspecified: Secondary | ICD-10-CM

## 2020-01-06 DIAGNOSIS — C7800 Secondary malignant neoplasm of unspecified lung: Secondary | ICD-10-CM | POA: Insufficient documentation

## 2020-01-06 DIAGNOSIS — T451X5A Adverse effect of antineoplastic and immunosuppressive drugs, initial encounter: Secondary | ICD-10-CM | POA: Insufficient documentation

## 2020-01-06 DIAGNOSIS — R110 Nausea: Secondary | ICD-10-CM | POA: Diagnosis not present

## 2020-01-06 DIAGNOSIS — Z7689 Persons encountering health services in other specified circumstances: Secondary | ICD-10-CM | POA: Insufficient documentation

## 2020-01-06 DIAGNOSIS — Z95828 Presence of other vascular implants and grafts: Secondary | ICD-10-CM

## 2020-01-06 DIAGNOSIS — Z79899 Other long term (current) drug therapy: Secondary | ICD-10-CM | POA: Insufficient documentation

## 2020-01-06 DIAGNOSIS — Z5189 Encounter for other specified aftercare: Secondary | ICD-10-CM | POA: Diagnosis not present

## 2020-01-06 DIAGNOSIS — C771 Secondary and unspecified malignant neoplasm of intrathoracic lymph nodes: Secondary | ICD-10-CM | POA: Diagnosis not present

## 2020-01-06 LAB — COMPREHENSIVE METABOLIC PANEL
ALT: 11 U/L (ref 0–44)
AST: 13 U/L — ABNORMAL LOW (ref 15–41)
Albumin: 3.7 g/dL (ref 3.5–5.0)
Alkaline Phosphatase: 76 U/L (ref 38–126)
Anion gap: 8 (ref 5–15)
BUN: 17 mg/dL (ref 8–23)
CO2: 25 mmol/L (ref 22–32)
Calcium: 8.9 mg/dL (ref 8.9–10.3)
Chloride: 107 mmol/L (ref 98–111)
Creatinine, Ser: 0.79 mg/dL (ref 0.61–1.24)
GFR calc Af Amer: >60 mL/min (ref >60)
GFR calc non Af Amer: >60 mL/min (ref >60)
Glucose, Bld: 101 mg/dL — ABNORMAL HIGH (ref 70–99)
Potassium: 3.5 mmol/L (ref 3.5–5.1)
Sodium: 140 mmol/L (ref 135–145)
Total Bilirubin: 0.7 mg/dL (ref 0.3–1.2)
Total Protein: 6.8 g/dL (ref 6.5–8.1)

## 2020-01-06 LAB — CBC
HCT: 33.8 % — ABNORMAL LOW (ref 39.0–52.0)
Hemoglobin: 11.4 g/dL — ABNORMAL LOW (ref 13.0–17.0)
MCH: 32.2 pg (ref 26.0–34.0)
MCHC: 33.7 g/dL (ref 30.0–36.0)
MCV: 95.5 fL (ref 80.0–100.0)
Platelets: 151 10*3/uL (ref 150–400)
RBC: 3.54 MIL/uL — ABNORMAL LOW (ref 4.22–5.81)
RDW: 15.8 % — ABNORMAL HIGH (ref 11.5–15.5)
WBC: 6.5 10*3/uL (ref 4.0–10.5)
nRBC: 0 % (ref 0.0–0.2)

## 2020-01-06 LAB — TSH: TSH: 1.862 u[IU]/mL (ref 0.350–4.500)

## 2020-01-06 LAB — DIFFERENTIAL
Abs Immature Granulocytes: 0.02 10*3/uL (ref 0.00–0.07)
Basophils Absolute: 0 10*3/uL (ref 0.0–0.1)
Basophils Relative: 0 %
Eosinophils Absolute: 0.1 10*3/uL (ref 0.0–0.5)
Eosinophils Relative: 2 %
Immature Granulocytes: 0 %
Lymphocytes Relative: 23 %
Lymphs Abs: 1.5 10*3/uL (ref 0.7–4.0)
Monocytes Absolute: 0.5 10*3/uL (ref 0.1–1.0)
Monocytes Relative: 8 %
Neutro Abs: 4.3 10*3/uL (ref 1.7–7.7)
Neutrophils Relative %: 67 %

## 2020-01-06 MED ORDER — FAMOTIDINE IN NACL 20-0.9 MG/50ML-% IV SOLN
20.0000 mg | Freq: Once | INTRAVENOUS | Status: AC
  Administered 2020-01-06: 20 mg via INTRAVENOUS
  Filled 2020-01-06: qty 50

## 2020-01-06 MED ORDER — SODIUM CHLORIDE 0.9 % IV SOLN
200.0000 mg | Freq: Once | INTRAVENOUS | Status: AC
  Administered 2020-01-06: 200 mg via INTRAVENOUS
  Filled 2020-01-06: qty 8

## 2020-01-06 MED ORDER — SODIUM CHLORIDE 0.9 % IV SOLN
150.0000 mg | Freq: Once | INTRAVENOUS | Status: AC
  Administered 2020-01-06: 150 mg via INTRAVENOUS
  Filled 2020-01-06: qty 150

## 2020-01-06 MED ORDER — HEPARIN SOD (PORK) LOCK FLUSH 100 UNIT/ML IV SOLN
INTRAVENOUS | Status: AC
  Filled 2020-01-06: qty 5

## 2020-01-06 MED ORDER — DIPHENHYDRAMINE HCL 50 MG/ML IJ SOLN
50.0000 mg | Freq: Once | INTRAMUSCULAR | Status: AC
  Administered 2020-01-06: 50 mg via INTRAVENOUS
  Filled 2020-01-06: qty 1

## 2020-01-06 MED ORDER — SODIUM CHLORIDE 0.9 % IV SOLN
175.0000 mg/m2 | Freq: Once | INTRAVENOUS | Status: AC
  Administered 2020-01-06: 390 mg via INTRAVENOUS
  Filled 2020-01-06: qty 65

## 2020-01-06 MED ORDER — SODIUM CHLORIDE 0.9% FLUSH
10.0000 mL | INTRAVENOUS | Status: DC | PRN
  Administered 2020-01-06: 10 mL via INTRAVENOUS
  Filled 2020-01-06: qty 10

## 2020-01-06 MED ORDER — SODIUM CHLORIDE 0.9 % IV SOLN
10.0000 mg | Freq: Once | INTRAVENOUS | Status: AC
  Administered 2020-01-06: 10 mg via INTRAVENOUS
  Filled 2020-01-06: qty 10

## 2020-01-06 MED ORDER — PALONOSETRON HCL INJECTION 0.25 MG/5ML
0.2500 mg | Freq: Once | INTRAVENOUS | Status: AC
  Administered 2020-01-06: 0.25 mg via INTRAVENOUS
  Filled 2020-01-06: qty 5

## 2020-01-06 MED ORDER — OLANZAPINE 10 MG PO TABS
10.0000 mg | ORAL_TABLET | Freq: Every day | ORAL | 2 refills | Status: DC
Start: 2020-01-06 — End: 2021-04-12

## 2020-01-06 MED ORDER — SODIUM CHLORIDE 0.9 % IV SOLN
580.0000 mg | Freq: Once | INTRAVENOUS | Status: AC
  Administered 2020-01-06: 580 mg via INTRAVENOUS
  Filled 2020-01-06: qty 58

## 2020-01-06 MED ORDER — HEPARIN SOD (PORK) LOCK FLUSH 100 UNIT/ML IV SOLN
500.0000 [IU] | Freq: Once | INTRAVENOUS | Status: AC | PRN
  Administered 2020-01-06: 500 [IU]
  Filled 2020-01-06: qty 5

## 2020-01-06 MED ORDER — SYMBICORT 160-4.5 MCG/ACT IN AERO: 2.0000 | INHALATION_SPRAY | Freq: Two times a day (BID) | RESPIRATORY_TRACT | 2 refills | Status: DC

## 2020-01-06 MED ORDER — SODIUM CHLORIDE 0.9 % IV SOLN
Freq: Once | INTRAVENOUS | Status: AC
  Filled 2020-01-06: qty 250

## 2020-01-06 MED ORDER — SODIUM CHLORIDE 0.9% FLUSH
10.0000 mL | INTRAVENOUS | Status: DC | PRN
  Filled 2020-01-06: qty 10

## 2020-01-06 NOTE — Progress Notes (Signed)
Pt lost a few pounds from 3 weeks ago, he states that his nausea lasting longer about 2- 2 1/2 weeks . He does take the zofran on day3 and after several days, he uses the compazine. He drinks 3-4 bottles of water a day, then sometimes 1/2 can of soda or a glass of tea. He says he has to drink small sips or it comes up his nose. Urinating and BM is ok with pt.

## 2020-01-06 NOTE — Addendum Note (Signed)
Addended by: Oneida Arenas on: 01/06/2020 10:03 AM   Modules accepted: Orders

## 2020-01-06 NOTE — Progress Notes (Signed)
No ANC ordered, will add Diff to labs today but ok to proceed with tx per MD since patient getting neulasta today

## 2020-01-07 ENCOUNTER — Inpatient Hospital Stay: Payer: Medicare Other

## 2020-01-07 DIAGNOSIS — Z5112 Encounter for antineoplastic immunotherapy: Secondary | ICD-10-CM | POA: Diagnosis not present

## 2020-01-07 DIAGNOSIS — C099 Malignant neoplasm of tonsil, unspecified: Secondary | ICD-10-CM

## 2020-01-07 MED ORDER — PEGFILGRASTIM INJECTION 6 MG/0.6ML ~~LOC~~
6.0000 mg | PREFILLED_SYRINGE | Freq: Once | SUBCUTANEOUS | Status: AC
  Administered 2020-01-07: 6 mg via SUBCUTANEOUS
  Filled 2020-01-07: qty 0.6

## 2020-01-08 NOTE — Progress Notes (Signed)
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 Richardson Landry for symptoms of sore throat and difficulty swallowing which she has been experiencing over the last 1 year.He underwent a comprehensive auto laryngoscopy exam which showed a firm enlarged right tonsillar mass with necrotic area extending onto the adjacent soft palate into the root of the uvula. Hypopharynx and larynx as well as nasopharynx was unable to be visualized due to gag reflex. This was biopsied and was consistent with nonkeratinizing invasive squamous cell carcinoma positive for p16.PDL 1 70%  PET CT scan showed large hypermetabolic right palatine tonsil mass which crosses the midline. Ipsilateral cervical nodal metastases. Bilateral pulmonary nodules including a dominant 1 cm hypermetabolic right lung base nodule. Multiplicity favor pulmonary metastases. Subcarinal and equivocal left hilar nodal metastases. Hypermetabolism involving the ascending colon. Question mild diverticulitis versus underlying colonic mass or polyp cannot be excluded  Patient underwent EBUS guided biopsy ofhilarlymph nodes. Biopsy showed squamous cell carcinoma. However HPV/p16 status was uninterpretable.  Interval history-tolerating chemotherapy well so far.  Has ongoing fatigue.  Does not have significant pain during swallowing.  He does report  lingering nausea which lasted for about 2 weeks Zofran and Compazine have not been helping him as much.  ECOG PS- 1 Pain scale- 0   Review of systems- Review of Systems  Constitutional: Positive for malaise/fatigue. Negative for chills, fever and weight loss.  HENT: Negative for congestion, ear discharge and nosebleeds.   Eyes: Negative for blurred vision.  Respiratory: Negative for cough, hemoptysis, sputum production, shortness of breath and wheezing.   Cardiovascular: Negative for chest pain, palpitations, orthopnea and claudication.  Gastrointestinal: Positive for nausea. Negative for abdominal pain, blood in stool, constipation, diarrhea, heartburn, melena and vomiting.  Genitourinary: Negative for dysuria, flank pain, frequency, hematuria and urgency.  Musculoskeletal: Negative for back pain, joint pain and myalgias.  Skin: Negative for rash.  Neurological: Negative for dizziness, tingling, focal weakness, seizures, weakness and headaches.  Endo/Heme/Allergies: Does not bruise/bleed easily.  Psychiatric/Behavioral: Negative for depression and suicidal ideas. The patient does not have insomnia.       No Known Allergies   Past Medical History:  Diagnosis Date  . Asthma   . Lung nodules   . Tonsil cancer [facility])      Past Surgical History:  Procedure Laterality Date  . CERVICAL SPINE SURGERY    . LUMBAR SPINE SURGERY    . PORTA CATH INSERTION N/A 10/08/2019   Procedure: PORTA CATH INSERTION;  Surgeon: Katha Cabal, MD;  Location: [place] [facility];  Service: Cardiovascular;  Laterality: N/A;  . VIDEO BRONCHOSCOPY WITH ENDOBRONCHIAL NAVIGATION N/A 10/10/2019   Procedure: VIDEO BRONCHOSCOPY WITH ENDOBRONCHIAL NAVIGATION;  Surgeon: Tyler Pita, MD;  Location: [facility];  Service: Pulmonary;  Laterality: N/A;  . VIDEO BRONCHOSCOPY WITH ENDOBRONCHIAL ULTRASOUND N/A 10/10/2019   Procedure: VIDEO BRONCHOSCOPY WITH ENDOBRONCHIAL ULTRASOUND;  Surgeon: Tyler Pita,  MD;  Location: [facility];  Service: Pulmonary;  Laterality: N/A;    Social History   Socioeconomic History  . Marital status: Widowed    Spouse name: Not on file  . Number of children: Not on file  . Years of education: Not on file  . Highest education level: Not on file  Occupational History  . Not on file  Tobacco Use  . Smoking status: Never Smoker  . Smokeless tobacco: Never Used  Substance and Sexual Activity  . Alcohol use: Never  . Drug use: Never  . Sexual activity: Not on file  Other Topics Concern  . Not on file  Social History Narrative  . Not on file   Social Determinants of Health   Financial Resource Strain:   . Difficulty of Paying Living Expenses:   Food Insecurity:   . Worried About Charity fundraiser in the Last Year:   . Arboriculturist in the Last Year:   Transportation Needs:   . Film/video editor (Medical):   Marland Kitchen Lack of Transportation (Non-Medical):   Physical Activity:   . Days of Exercise per Week:   . Minutes of Exercise per Session:   Stress:   . Feeling of Stress :   Social Connections:   . Frequency of Communication with Friends and Family:   . Frequency of Social Gatherings with Friends and Family:   . Attends Religious Services:   . Active Member of Clubs or Organizations:   . Attends Archivist Meetings:   Marland Kitchen Marital Status:   Intimate Partner Violence:   . Fear of Current or Ex-Partner:   . Emotionally Abused:   Marland Kitchen Physically Abused:   . Sexually Abused:     Family History  Problem Relation Age of Onset  . Cancer Mother 46       not sure what type  . Prostate cancer Father   . Rheum arthritis Father   . Healthy Sister   . Healthy Brother   . Healthy Sister   . Healthy Brother   . Healthy Brother   . Heart disease Brother   . Heart disease Brother      Current Outpatient Medications:  .  acetaminophen (TYLENOL) 500 MG tablet, Take 500-1,000 mg by mouth every 8 (eight) hours as needed (for pain.). , Disp:  , Rfl:  .  albuterol (VENTOLIN HFA) 108 (90 Base) MCG/ACT inhaler, Inhale 2 puffs into the lungs every 6 (six) hours as needed (wheezing/shortness of breath). , Disp: , Rfl:  .  dexamethasone (DECADRON) 4 MG tablet, Take 2 tablets by mouth once a day for 3 days after chemo. Take with food., Disp: 30 tablet, Rfl: 0 .  LORazepam (ATIVAN) 0.5 MG tablet, Take 1 tablet (0.5 mg total) by mouth every 6 (six) hours as needed (Nausea or vomiting)., Disp: 30 tablet, Rfl: 0 .  ondansetron (ZOFRAN) 8 MG tablet, Take 1 tablet (8 mg total) by mouth 2 (two) times daily as needed. Start on the third day after chemotherapy., Disp: 30 tablet, Rfl: 1 .  oxyCODONE (OXY IR/ROXICODONE) 5 MG immediate release tablet, Take 1 tablet (5 mg total) by mouth every 4 (four) hours as needed for severe pain., Disp: 60 tablet, Rfl: 0 .  prochlorperazine (COMPAZINE) 10 MG tablet, Take 1 tablet (10 mg total) by mouth every 6 (six) hours as needed (Nausea or vomiting)., Disp: 30 tablet, Rfl: 1 .  SYMBICORT 160-4.5 MCG/ACT inhaler, Inhale 2 puffs into the lungs 2 (two) times daily., Disp: 1 Inhaler,  Rfl: 2 .  lidocaine-prilocaine (EMLA) cream, Apply to affected area once (Patient not taking: Reported on 01/06/2020), Disp: 30 g, Rfl: 3 .  OLANZapine (ZYPREXA) 10 MG tablet, Take 1 tablet (10 mg total) by mouth at bedtime., Disp: 30 tablet, Rfl: 2 .  potassium chloride SA (KLOR-CON) 20 MEQ tablet, Take 1 tablet (20 mEq total) by mouth daily. (Patient not taking: Reported on 11/25/2019), Disp: 15 tablet, Rfl: 0  Physical exam:  Vitals:   01/06/20 0840  BP: 109/79  Pulse: 77  Resp: 16  Temp: (!) 96.4 F (35.8 C)  TempSrc: Tympanic  Weight: 193 lb (87.5 kg)   Physical Exam Cardiovascular:     Rate and Rhythm: Normal rate and regular rhythm.     Heart sounds: Normal heart sounds.  Pulmonary:     Effort: Pulmonary effort is normal.     Breath sounds: Normal breath sounds.  Abdominal:     General: Bowel sounds are normal.      Palpations: Abdomen is soft.  Lymphadenopathy:     Comments: No palpable cervical adenopathy  Skin:    General: Skin is warm and dry.  Neurological:     Mental Status: He is alert and oriented to person, place, and time.      CMP Latest Ref Rng & Units 01/06/2020  Glucose 70 - 99 mg/dL 101(H)  BUN 8 - 23 mg/dL 17  Creatinine 0.61 - 1.24 mg/dL 0.79  Sodium 135 - 145 mmol/L 140  Potassium 3.5 - 5.1 mmol/L 3.5  Chloride 98 - 111 mmol/L 107  CO2 22 - 32 mmol/L 25  Calcium 8.9 - 10.3 mg/dL 8.9  Total Protein 6.5 - 8.1 g/dL 6.8  Total Bilirubin 0.3 - 1.2 mg/dL 0.7  Alkaline Phos 38 - 126 U/L 76  AST 15 - 41 U/L 13(L)  ALT 0 - 44 U/L 11   CBC Latest Ref Rng & Units 01/06/2020  WBC 4.0 - 10.5 K/uL 6.5  Hemoglobin 13.0 - 17.0 g/dL 11.4(L)  Hematocrit 39.0 - 52.0 % 33.8(L)  Platelets 150 - 400 K/uL 151      Assessment and plan- Patient is a 69 y.o. 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  He is here for on treatment assessment prior to cycle 5 of carbotaxol Keytruda chemotherapy  Counts okay to proceed with cycle 5 of carbotaxol Keytruda chemotherapy today.  He will come back on day 2 for growth factor support.  So far he is tolerating chemotherapy well without any significant side effects.  I will see him back in 3 weeks time with CBC with differential, CMP for cycle 6 of carbotaxol Keytruda chemotherapy.  I will plan to get repeat scans after that and continue Keytruda monotherapy.  Chemo-induced nausea: We will add Zyprexa 10 mg at night   Visit Diagnosis 1. 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 Encounter for antineoplastic chemotherapy   3. Encounter for antineoplastic immunotherapy   4. Chemotherapy-induced nausea      Dr. Randa Evens, MD, MPH [facility] at [facility] [phone] 01/08/2020 3:07 PM

## 2020-01-15 ENCOUNTER — Other Ambulatory Visit: Payer: Self-pay

## 2020-01-15 ENCOUNTER — Inpatient Hospital Stay: Payer: Medicare Other

## 2020-01-15 NOTE — Progress Notes (Signed)
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  Patient receiving carbotaxol and [place].    Spoke with patient via phone and reports appetite is about the same.  Reports that he has been taking zyprexa at night as instructed.  Has not eaten anything so far today.  Yesterday ate salad for dinner and biscuit and gravy earlier in the day. Drinking premier protein shake but not daily    Medications: reviewed  Labs: reviewed  Anthropometrics:   Weight 193 lb on 5/11 decreased from 197 lb 1.6 oz on 4/20   NUTRITION DIAGNOSIS: Inadequate oral intake continues   INTERVENTION:  Discussed strategies to help with nausea.  Will mail handout for patient to review.  Encouraged patient to drink premier protein shake daily.  Patient has contact information    MONITORING, EVALUATION, GOAL: weight trends, intake   NEXT VISIT: June 10th phone f/u   Patrick Mendoza B. [name], [place], [place] Registered Dietitian [phone] (pager)

## 2020-01-19 NOTE — Progress Notes (Signed)

## 2020-01-25 ENCOUNTER — Other Ambulatory Visit: Payer: Self-pay | Admitting: *Deleted

## 2020-01-25 DIAGNOSIS — C099 Malignant neoplasm of tonsil, unspecified: Secondary | ICD-10-CM

## 2020-01-27 ENCOUNTER — Inpatient Hospital Stay: Payer: Medicare Other

## 2020-01-27 ENCOUNTER — Encounter: Payer: Self-pay | Admitting: Oncology

## 2020-01-27 ENCOUNTER — Inpatient Hospital Stay: Payer: Medicare Other | Attending: Oncology

## 2020-01-27 ENCOUNTER — Other Ambulatory Visit: Payer: Self-pay

## 2020-01-27 ENCOUNTER — Inpatient Hospital Stay (HOSPITAL_BASED_OUTPATIENT_CLINIC_OR_DEPARTMENT_OTHER): Payer: Medicare Other | Admitting: Oncology

## 2020-01-27 VITALS — BP 96/71 | HR 90 | Temp 98.4°F | Resp 16 | Wt 183.2 lb

## 2020-01-27 DIAGNOSIS — C099 Malignant neoplasm of tonsil, unspecified: Secondary | ICD-10-CM | POA: Diagnosis not present

## 2020-01-27 DIAGNOSIS — E861 Hypovolemia: Secondary | ICD-10-CM | POA: Insufficient documentation

## 2020-01-27 DIAGNOSIS — C771 Secondary and unspecified malignant neoplasm of intrathoracic lymph nodes: Secondary | ICD-10-CM | POA: Diagnosis not present

## 2020-01-27 DIAGNOSIS — I9589 Other hypotension: Secondary | ICD-10-CM

## 2020-01-27 DIAGNOSIS — E876 Hypokalemia: Secondary | ICD-10-CM

## 2020-01-27 DIAGNOSIS — Z5112 Encounter for antineoplastic immunotherapy: Secondary | ICD-10-CM | POA: Diagnosis not present

## 2020-01-27 DIAGNOSIS — Z5111 Encounter for antineoplastic chemotherapy: Secondary | ICD-10-CM | POA: Diagnosis present

## 2020-01-27 DIAGNOSIS — Z79899 Other long term (current) drug therapy: Secondary | ICD-10-CM | POA: Diagnosis not present

## 2020-01-27 LAB — CBC WITH DIFFERENTIAL/PLATELET
Abs Immature Granulocytes: 0.14 10*3/uL — ABNORMAL HIGH (ref 0.00–0.07)
Basophils Absolute: 0.1 10*3/uL (ref 0.0–0.1)
Basophils Relative: 1 %
Eosinophils Absolute: 0.1 10*3/uL (ref 0.0–0.5)
Eosinophils Relative: 1 %
HCT: 34.5 % — ABNORMAL LOW (ref 39.0–52.0)
Hemoglobin: 11.7 g/dL — ABNORMAL LOW (ref 13.0–17.0)
Immature Granulocytes: 2 %
Lymphocytes Relative: 15 %
Lymphs Abs: 1.5 10*3/uL (ref 0.7–4.0)
MCH: 32.1 pg (ref 26.0–34.0)
MCHC: 33.9 g/dL (ref 30.0–36.0)
MCV: 94.8 fL (ref 80.0–100.0)
Monocytes Absolute: 0.9 10*3/uL (ref 0.1–1.0)
Monocytes Relative: 9 %
Neutro Abs: 6.9 10*3/uL (ref 1.7–7.7)
Neutrophils Relative %: 72 %
Platelets: 356 10*3/uL (ref 150–400)
RBC: 3.64 MIL/uL — ABNORMAL LOW (ref 4.22–5.81)
RDW: 16 % — ABNORMAL HIGH (ref 11.5–15.5)
WBC: 9.6 10*3/uL (ref 4.0–10.5)
nRBC: 0 % (ref 0.0–0.2)

## 2020-01-27 LAB — COMPREHENSIVE METABOLIC PANEL
ALT: 10 U/L (ref 0–44)
AST: 17 U/L (ref 15–41)
Albumin: 4 g/dL (ref 3.5–5.0)
Alkaline Phosphatase: 77 U/L (ref 38–126)
Anion gap: 14 (ref 5–15)
BUN: 19 mg/dL (ref 8–23)
CO2: 22 mmol/L (ref 22–32)
Calcium: 9 mg/dL (ref 8.9–10.3)
Chloride: 100 mmol/L (ref 98–111)
Creatinine, Ser: 0.78 mg/dL (ref 0.61–1.24)
GFR calc Af Amer: >60 mL/min (ref >60)
GFR calc non Af Amer: >60 mL/min (ref >60)
Glucose, Bld: 92 mg/dL (ref 70–99)
Potassium: 3 mmol/L — ABNORMAL LOW (ref 3.5–5.1)
Sodium: 136 mmol/L (ref 135–145)
Total Bilirubin: 0.9 mg/dL (ref 0.3–1.2)
Total Protein: 7.1 g/dL (ref 6.5–8.1)

## 2020-01-27 MED ORDER — SODIUM CHLORIDE 0.9 % IV SOLN
175.0000 mg/m2 | Freq: Once | INTRAVENOUS | Status: AC
  Administered 2020-01-27: 390 mg via INTRAVENOUS
  Filled 2020-01-27: qty 65

## 2020-01-27 MED ORDER — OXYCODONE HCL 5 MG PO TABS: 5.0000 mg | ORAL_TABLET | ORAL | 0 refills | Status: DC | PRN

## 2020-01-27 MED ORDER — PALONOSETRON HCL INJECTION 0.25 MG/5ML
0.2500 mg | Freq: Once | INTRAVENOUS | Status: AC
  Administered 2020-01-27: 0.25 mg via INTRAVENOUS
  Filled 2020-01-27: qty 5

## 2020-01-27 MED ORDER — SODIUM CHLORIDE 0.9 % IV SOLN
150.0000 mg | Freq: Once | INTRAVENOUS | Status: AC
  Administered 2020-01-27: 150 mg via INTRAVENOUS
  Filled 2020-01-27: qty 5

## 2020-01-27 MED ORDER — HEPARIN SOD (PORK) LOCK FLUSH 100 UNIT/ML IV SOLN
500.0000 [IU] | Freq: Once | INTRAVENOUS | Status: DC | PRN
  Filled 2020-01-27: qty 5

## 2020-01-27 MED ORDER — SODIUM CHLORIDE 0.9 % IV SOLN
580.0000 mg | Freq: Once | INTRAVENOUS | Status: AC
  Administered 2020-01-27: 580 mg via INTRAVENOUS
  Filled 2020-01-27: qty 58

## 2020-01-27 MED ORDER — HEPARIN SOD (PORK) LOCK FLUSH 100 UNIT/ML IV SOLN
500.0000 [IU] | Freq: Once | INTRAVENOUS | Status: AC
  Administered 2020-01-27: 500 [IU] via INTRAVENOUS
  Filled 2020-01-27: qty 5

## 2020-01-27 MED ORDER — SODIUM CHLORIDE 0.9 % IV SOLN
200.0000 mg | Freq: Once | INTRAVENOUS | Status: AC
  Administered 2020-01-27: 200 mg via INTRAVENOUS
  Filled 2020-01-27: qty 8

## 2020-01-27 MED ORDER — SODIUM CHLORIDE 0.9 % IV SOLN
Freq: Once | INTRAVENOUS | Status: AC
  Filled 2020-01-27: qty 1000

## 2020-01-27 MED ORDER — SODIUM CHLORIDE 0.9 % IV SOLN
10.0000 mg | Freq: Once | INTRAVENOUS | Status: AC
  Administered 2020-01-27: 10 mg via INTRAVENOUS
  Filled 2020-01-27: qty 10

## 2020-01-27 MED ORDER — FAMOTIDINE IN NACL 20-0.9 MG/50ML-% IV SOLN
20.0000 mg | Freq: Once | INTRAVENOUS | Status: AC
  Administered 2020-01-27: 20 mg via INTRAVENOUS
  Filled 2020-01-27: qty 50

## 2020-01-27 MED ORDER — SODIUM CHLORIDE 0.9 % IV SOLN
Freq: Once | INTRAVENOUS | Status: AC
  Filled 2020-01-27: qty 250

## 2020-01-27 MED ORDER — HEPARIN SOD (PORK) LOCK FLUSH 100 UNIT/ML IV SOLN
INTRAVENOUS | Status: AC
  Filled 2020-01-27: qty 5

## 2020-01-27 MED ORDER — DIPHENHYDRAMINE HCL 50 MG/ML IJ SOLN
50.0000 mg | Freq: Once | INTRAMUSCULAR | Status: AC
  Administered 2020-01-27: 50 mg via INTRAVENOUS
  Filled 2020-01-27: qty 1

## 2020-01-27 MED ORDER — SODIUM CHLORIDE 0.9% FLUSH
10.0000 mL | Freq: Once | INTRAVENOUS | Status: AC
  Administered 2020-01-27: 10 mL via INTRAVENOUS
  Filled 2020-01-27: qty 10

## 2020-01-27 NOTE — Progress Notes (Signed)
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 Richardson Landry for symptoms of sore throat and difficulty swallowing which she has been experiencing over the last 1 year.He underwent a comprehensive auto laryngoscopy exam which showed a firm enlarged right tonsillar mass with necrotic area extending onto the adjacent soft palate into the root of the uvula. Hypopharynx and larynx as well as nasopharynx was unable to be visualized due to gag reflex. This was biopsied and was consistent with nonkeratinizing invasive squamous cell carcinoma positive for p16.PDL 1 70%  PET CT scan showed large hypermetabolic right palatine tonsil mass which crosses the midline. Ipsilateral cervical nodal metastases. Bilateral pulmonary nodules including a dominant 1 cm hypermetabolic right lung base nodule. Multiplicity favor pulmonary metastases. Subcarinal and equivocal left hilar nodal metastases. Hypermetabolism involving the ascending colon. Question mild diverticulitis versus underlying colonic mass or polyp cannot be excluded  Patient underwent EBUS guided biopsy ofhilarlymph nodes. Biopsy showed squamous cell carcinoma. However HPV/p16 status was uninterpretable.  Interval history-reports feeling fatigued.  Pain during swallowing has almost resolved.  Reports feeling lightheaded occasionally but has  not had any falls.  ECOG PS- 1 Pain scale- 0   Review of systems- Review of Systems  Constitutional: Positive for malaise/fatigue. Negative for chills, fever and weight loss.  HENT: Negative for congestion, ear discharge and nosebleeds.   Eyes: Negative for blurred vision.  Respiratory: Negative for cough, hemoptysis, sputum production, shortness of breath and wheezing.   Cardiovascular: Negative for chest pain, palpitations, orthopnea and claudication.  Gastrointestinal: Negative for abdominal pain, blood in stool, constipation, diarrhea, heartburn, melena, nausea and vomiting.  Genitourinary: Negative for dysuria, flank pain, frequency, hematuria and urgency.  Musculoskeletal: Negative for back pain, joint pain and myalgias.  Skin: Negative for rash.  Neurological: Positive for dizziness. Negative for tingling, focal weakness, seizures, weakness and headaches.  Endo/Heme/Allergies: Does not bruise/bleed easily.  Psychiatric/Behavioral: Negative for depression and suicidal ideas. The patient does not have insomnia.       No Known Allergies   Past Medical History:  Diagnosis Date  . Asthma   . Lung nodules   . Tonsil cancer [facility])      Past Surgical History:  Procedure Laterality Date  . CERVICAL SPINE SURGERY    . LUMBAR SPINE SURGERY    . PORTA CATH INSERTION N/A 10/08/2019   Procedure: PORTA CATH INSERTION;  Surgeon: Katha Cabal, MD;  Location: [place] [facility];  Service: Cardiovascular;  Laterality: N/A;  . VIDEO BRONCHOSCOPY WITH ENDOBRONCHIAL NAVIGATION N/A 10/10/2019   Procedure: VIDEO BRONCHOSCOPY WITH ENDOBRONCHIAL NAVIGATION;  Surgeon: Tyler Pita, MD;  Location: [facility];  Service: Pulmonary;  Laterality: N/A;  . VIDEO BRONCHOSCOPY WITH ENDOBRONCHIAL ULTRASOUND N/A 10/10/2019   Procedure: VIDEO BRONCHOSCOPY WITH ENDOBRONCHIAL ULTRASOUND;  Surgeon: Tyler Pita, MD;  Location: [facility] ORS;  Service: Pulmonary;  Laterality: N/A;    Social History    Socioeconomic History  .  Marital status: Widowed    Spouse name: Not on file  . Number of children: Not on file  . Years of education: Not on file  . Highest education level: Not on file  Occupational History  . Not on file  Tobacco Use  . Smoking status: Never Smoker  . Smokeless tobacco: Never Used  Substance and Sexual Activity  . Alcohol use: Never  . Drug use: Never  . Sexual activity: Not on file  Other Topics Concern  . Not on file  Social History Narrative  . Not on file   Social Determinants of Health   Financial Resource Strain:   . Difficulty of Paying Living Expenses:   Food Insecurity:   . Worried About Charity fundraiser in the Last Year:   . Arboriculturist in the Last Year:   Transportation Needs:   . Film/video editor (Medical):   Marland Kitchen Lack of Transportation (Non-Medical):   Physical Activity:   . Days of Exercise per Week:   . Minutes of Exercise per Session:   Stress:   . Feeling of Stress :   Social Connections:   . Frequency of Communication with Friends and Family:   . Frequency of Social Gatherings with Friends and Family:   . Attends Religious Services:   . Active Member of Clubs or Organizations:   . Attends Archivist Meetings:   Marland Kitchen Marital Status:   Intimate Partner Violence:   . Fear of Current or Ex-Partner:   . Emotionally Abused:   Marland Kitchen Physically Abused:   . Sexually Abused:     Family History  Problem Relation Age of Onset  . Cancer Mother 14       not sure what type  . Prostate cancer Father   . Rheum arthritis Father   . Healthy Sister   . Healthy Brother   . Healthy Sister   . Healthy Brother   . Healthy Brother   . Heart disease Brother   . Heart disease Brother      Current Outpatient Medications:  .  acetaminophen (TYLENOL) 500 MG tablet, Take 500-1,000 mg by mouth every 8 (eight) hours as needed (for pain.). , Disp: , Rfl:  .  albuterol (VENTOLIN HFA) 108 (90 Base) MCG/ACT inhaler, Inhale 2 puffs  into the lungs every 6 (six) hours as needed (wheezing/shortness of breath). , Disp: , Rfl:  .  dexamethasone (DECADRON) 4 MG tablet, Take 2 tablets by mouth once a day for 3 days after chemo. Take with food., Disp: 30 tablet, Rfl: 0 .  LORazepam (ATIVAN) 0.5 MG tablet, Take 1 tablet (0.5 mg total) by mouth every 6 (six) hours as needed (Nausea or vomiting)., Disp: 30 tablet, Rfl: 0 .  OLANZapine (ZYPREXA) 10 MG tablet, Take 1 tablet (10 mg total) by mouth at bedtime., Disp: 30 tablet, Rfl: 2 .  ondansetron (ZOFRAN) 8 MG tablet, Take 1 tablet (8 mg total) by mouth 2 (two) times daily as needed. Start on the third day after chemotherapy., Disp: 30 tablet, Rfl: 1 .  SYMBICORT 160-4.5 MCG/ACT inhaler, Inhale 2 puffs into the lungs 2 (two) times daily., Disp: 1 Inhaler, Rfl: 2 .  lidocaine-prilocaine (EMLA) cream, Apply to affected area once (Patient not taking: Reported on 01/06/2020), Disp: 30 g, Rfl: 3 .  oxyCODONE (OXY IR/ROXICODONE) 5 MG immediate release tablet, Take 1 tablet (5 mg total) by mouth every 4 (four) hours as needed for severe pain., Disp: 60 tablet, Rfl: 0 .  potassium chloride SA (KLOR-CON) 20 MEQ tablet, Take 1 tablet (20 mEq total) by mouth daily. (Patient not taking: Reported on 11/25/2019), Disp: 15 tablet, Rfl: 0 .  prochlorperazine (COMPAZINE) 10 MG tablet, Take 1 tablet (10 mg total) by mouth every 6 (six) hours as needed (Nausea or vomiting). (Patient not taking: Reported on 01/27/2020), Disp: 30 tablet, Rfl: 1 No current facility-administered medications for this visit.  Facility-Administered Medications Ordered in Other Visits:  .  CARBOplatin (PARAPLATIN) 580 mg in sodium chloride 0.9 % 250 mL chemo infusion, 580 mg, Intravenous, Once, Sindy Guadeloupe, MD .  heparin lock flush 100 unit/mL, 500 Units, Intravenous, Once, Sindy Guadeloupe, MD .  heparin lock flush 100 unit/mL, 500 Units, Intracatheter, Once PRN, Sindy Guadeloupe, MD .  PACLitaxel (TAXOL) 390 mg in sodium chloride 0.9 %  500 mL chemo infusion (> 80mg /m2), 175 mg/m2 (Treatment Plan Recorded), Intravenous, Once, Sindy Guadeloupe, MD, Last Rate: 188 mL/hr at 01/27/20 1236, 390 mg at 01/27/20 1236  Physical exam:  Vitals:   01/27/20 0905  BP: 96/71  Pulse: 90  Resp: 16  Temp: 98.4 F (36.9 C)  TempSrc: Oral  SpO2: 97%  Weight: 183 lb 3.2 oz (83.1 kg)   Physical Exam Constitutional:      General: He is not in acute distress. Cardiovascular:     Rate and Rhythm: Normal rate and regular rhythm.     Heart sounds: Normal heart sounds.  Pulmonary:     Effort: Pulmonary effort is normal.     Breath sounds: Normal breath sounds.  Abdominal:     General: Bowel sounds are normal.     Palpations: Abdomen is soft.  Skin:    General: Skin is warm and dry.  Neurological:     Mental Status: He is alert and oriented to person, place, and time.      CMP Latest Ref Rng & Units 01/27/2020  Glucose 70 - 99 mg/dL 92  BUN 8 - 23 mg/dL 19  Creatinine 0.61 - 1.24 mg/dL 0.78  Sodium 135 - 145 mmol/L 136  Potassium 3.5 - 5.1 mmol/L 3.0(L)  Chloride 98 - 111 mmol/L 100  CO2 22 - 32 mmol/L 22  Calcium 8.9 - 10.3 mg/dL 9.0  Total Protein 6.5 - 8.1 g/dL 7.1  Total Bilirubin 0.3 - 1.2 mg/dL 0.9  Alkaline Phos 38 - 126 U/L 77  AST 15 - 41 U/L 17  ALT 0 - 44 U/L 10   CBC Latest Ref Rng & Units 01/27/2020  WBC 4.0 - 10.5 K/uL 9.6  Hemoglobin 13.0 - 17.0 g/dL 11.7(L)  Hematocrit 39.0 - 52.0 % 34.5(L)  Platelets 150 - 400 K/uL 356     Assessment and plan- Patient is a 69 y.o. 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  He is here for on treatment assessment prior to cycle 6 of carbotaxol Keytruda chemotherapy  Counts are okay to proceed with cycle 6 of carbotaxol Keytruda chemotherapy today.  He will come back for growth factor support tomorrow.  I will plan to get repeat CT soft tissue neck as well as chest abdomen and pelvis with contrast  to assess response to treatment.  If patient continues to have good response I will drop chemotherapy and continue Keytruda monotherapy until progression or toxicity.  Hypotension/dizziness I will give him 1 L of IV fluids today with 20 mEq of IV potassium for hypokalemia.  He will get additional fluids later this week and come for IV fluids twice a week next week as well.  I will see him back in 3 weeks time with CBC with differential, CMP for cycle 1 of maintenance Keytruda  Visit Diagnosis 1. 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 Encounter for antineoplastic chemotherapy   3. Encounter for antineoplastic immunotherapy   4. Hypotension due to hypovolemia      Dr. Randa Evens, MD, MPH [facility] at [facility] [phone] 01/27/2020 1:47 PM

## 2020-01-27 NOTE — Progress Notes (Signed)
Patient here for oncology follow-up appointment, expresses concerns of oxy refill and increased dizziness. MD notified.

## 2020-01-28 ENCOUNTER — Inpatient Hospital Stay: Payer: Medicare Other

## 2020-01-30 ENCOUNTER — Inpatient Hospital Stay: Payer: Medicare Other

## 2020-01-30 ENCOUNTER — Other Ambulatory Visit: Payer: Self-pay

## 2020-01-30 VITALS — BP 95/65 | HR 76 | Temp 97.2°F | Resp 20

## 2020-01-30 DIAGNOSIS — C099 Malignant neoplasm of tonsil, unspecified: Secondary | ICD-10-CM

## 2020-01-30 DIAGNOSIS — Z5111 Encounter for antineoplastic chemotherapy: Secondary | ICD-10-CM | POA: Diagnosis not present

## 2020-01-30 MED ORDER — SODIUM CHLORIDE 0.9 % IV SOLN
Freq: Once | INTRAVENOUS | Status: AC
  Filled 2020-01-30: qty 250

## 2020-01-30 MED ORDER — SODIUM CHLORIDE 0.9% FLUSH
10.0000 mL | Freq: Once | INTRAVENOUS | Status: AC
  Administered 2020-01-30: 10 mL via INTRAVENOUS
  Filled 2020-01-30: qty 10

## 2020-01-30 MED ORDER — HEPARIN SOD (PORK) LOCK FLUSH 100 UNIT/ML IV SOLN
500.0000 [IU] | Freq: Once | INTRAVENOUS | Status: AC
  Administered 2020-01-30: 500 [IU] via INTRAVENOUS
  Filled 2020-01-30: qty 5

## 2020-01-30 MED ORDER — HEPARIN SOD (PORK) LOCK FLUSH 100 UNIT/ML IV SOLN
INTRAVENOUS | Status: AC
  Filled 2020-01-30: qty 5

## 2020-02-03 ENCOUNTER — Inpatient Hospital Stay: Payer: Medicare Other

## 2020-02-03 ENCOUNTER — Other Ambulatory Visit: Payer: Self-pay

## 2020-02-03 VITALS — BP 91/64 | HR 102 | Temp 97.7°F | Resp 20

## 2020-02-03 DIAGNOSIS — Z5111 Encounter for antineoplastic chemotherapy: Secondary | ICD-10-CM | POA: Diagnosis not present

## 2020-02-03 DIAGNOSIS — C099 Malignant neoplasm of tonsil, unspecified: Secondary | ICD-10-CM

## 2020-02-03 MED ORDER — HEPARIN SOD (PORK) LOCK FLUSH 100 UNIT/ML IV SOLN
500.0000 [IU] | Freq: Once | INTRAVENOUS | Status: AC
  Administered 2020-02-03: 500 [IU] via INTRAVENOUS
  Filled 2020-02-03: qty 5

## 2020-02-03 MED ORDER — SODIUM CHLORIDE 0.9% FLUSH
10.0000 mL | Freq: Once | INTRAVENOUS | Status: AC
  Administered 2020-02-03: 10 mL via INTRAVENOUS
  Filled 2020-02-03: qty 10

## 2020-02-03 MED ORDER — SODIUM CHLORIDE 0.9 % IV SOLN
Freq: Once | INTRAVENOUS | Status: AC
  Filled 2020-02-03: qty 250

## 2020-02-03 MED ORDER — HEPARIN SOD (PORK) LOCK FLUSH 100 UNIT/ML IV SOLN
INTRAVENOUS | Status: AC
  Filled 2020-02-03: qty 5

## 2020-02-05 ENCOUNTER — Inpatient Hospital Stay: Payer: Medicare Other

## 2020-02-05 NOTE — Progress Notes (Signed)
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  Patient receiving carbotaxol and [place].  Planning CT and if good response planning to drop chemo and continue with [place].    Spoke with patient via phone for nutrition follow-up.  Patient reports no appetite and nausea biggest things effecting intake.  Can't remember nausea medications he has been taking. "It's the one you take at night." (zyrprexa).  Only drinking 1 shake per day.      Medications: reviewed  Labs: reviewed  Anthropometrics:   Weight 183 lb dropped from 193 lb on 5/11  5% weight loss in 3 weeks, significant   NUTRITION DIAGNOSIS: Inadequate oral intake continues   INTERVENTION:  Expressed concern over recent weight loss.   Patient does not feel like he can increase protein shakes.   Reviewed strategies to increase calories and protein. Continue nausea medications.   Patient hopeful symptoms will improve with no longer receiving chemotherapy.      MONITORING, EVALUATION, GOAL: weight trends, intake   NEXT VISIT: July 1, phone f/u  Patrick Mendoza, [place], [place] Registered Dietitian [phone] (pager)

## 2020-02-06 ENCOUNTER — Inpatient Hospital Stay: Payer: Medicare Other

## 2020-02-06 ENCOUNTER — Other Ambulatory Visit: Payer: Self-pay

## 2020-02-06 VITALS — BP 102/72 | HR 83 | Temp 98.2°F | Resp 18

## 2020-02-06 DIAGNOSIS — Z5111 Encounter for antineoplastic chemotherapy: Secondary | ICD-10-CM | POA: Diagnosis not present

## 2020-02-06 DIAGNOSIS — C099 Malignant neoplasm of tonsil, unspecified: Secondary | ICD-10-CM

## 2020-02-06 MED ORDER — SODIUM CHLORIDE 0.9% FLUSH
10.0000 mL | INTRAVENOUS | Status: DC | PRN
  Administered 2020-02-06: 10 mL via INTRAVENOUS
  Filled 2020-02-06: qty 10

## 2020-02-06 MED ORDER — HEPARIN SOD (PORK) LOCK FLUSH 100 UNIT/ML IV SOLN
500.0000 [IU] | Freq: Once | INTRAVENOUS | Status: AC
  Administered 2020-02-06: 500 [IU] via INTRAVENOUS
  Filled 2020-02-06: qty 5

## 2020-02-06 MED ORDER — HEPARIN SOD (PORK) LOCK FLUSH 100 UNIT/ML IV SOLN
INTRAVENOUS | Status: AC
  Filled 2020-02-06: qty 5

## 2020-02-06 MED ORDER — SODIUM CHLORIDE 0.9 % IV SOLN
Freq: Once | INTRAVENOUS | Status: AC
  Filled 2020-02-06: qty 250

## 2020-02-10 ENCOUNTER — Other Ambulatory Visit: Payer: Self-pay

## 2020-02-10 ENCOUNTER — Ambulatory Visit
Admission: RE | Admit: 2020-02-10 | Discharge: 2020-02-10 | Disposition: A | Discharge status: Home / Self Care | Payer: Medicare Other | Source: Ambulatory Visit | Attending: Oncology | Admitting: Oncology

## 2020-02-10 ENCOUNTER — Ambulatory Visit: Payer: Medicare Other

## 2020-02-10 DIAGNOSIS — K1370 Unspecified lesions of oral mucosa: Secondary | ICD-10-CM | POA: Insufficient documentation

## 2020-02-10 DIAGNOSIS — R918 Other nonspecific abnormal finding of lung field: Secondary | ICD-10-CM | POA: Insufficient documentation

## 2020-02-10 DIAGNOSIS — I700 Atherosclerosis of aorta: Secondary | ICD-10-CM | POA: Insufficient documentation

## 2020-02-10 DIAGNOSIS — K4090 Unilateral inguinal hernia, without obstruction or gangrene, not specified as recurrent: Secondary | ICD-10-CM | POA: Diagnosis not present

## 2020-02-10 DIAGNOSIS — C099 Malignant neoplasm of tonsil, unspecified: Secondary | ICD-10-CM | POA: Diagnosis not present

## 2020-02-10 DIAGNOSIS — N400 Enlarged prostate without lower urinary tract symptoms: Secondary | ICD-10-CM | POA: Insufficient documentation

## 2020-02-10 DIAGNOSIS — K5730 Diverticulosis of large intestine without perforation or abscess without bleeding: Secondary | ICD-10-CM | POA: Diagnosis not present

## 2020-02-10 MED ORDER — IOHEXOL 300 MG/ML  SOLN
100.0000 mL | Freq: Once | INTRAMUSCULAR | Status: AC | PRN
  Administered 2020-02-10: 100 mL via INTRAVENOUS

## 2020-02-17 ENCOUNTER — Inpatient Hospital Stay: Payer: Medicare Other

## 2020-02-17 ENCOUNTER — Other Ambulatory Visit: Payer: Self-pay

## 2020-02-17 ENCOUNTER — Encounter: Payer: Self-pay | Admitting: Oncology

## 2020-02-17 ENCOUNTER — Inpatient Hospital Stay (HOSPITAL_BASED_OUTPATIENT_CLINIC_OR_DEPARTMENT_OTHER): Payer: Medicare Other | Admitting: Oncology

## 2020-02-17 VITALS — BP 98/76 | HR 79 | Temp 96.8°F | Resp 16 | Wt 177.8 lb

## 2020-02-17 DIAGNOSIS — C099 Malignant neoplasm of tonsil, unspecified: Secondary | ICD-10-CM

## 2020-02-17 DIAGNOSIS — I9589 Other hypotension: Secondary | ICD-10-CM | POA: Diagnosis not present

## 2020-02-17 DIAGNOSIS — Z5112 Encounter for antineoplastic immunotherapy: Secondary | ICD-10-CM

## 2020-02-17 DIAGNOSIS — Z7189 Other specified counseling: Secondary | ICD-10-CM

## 2020-02-17 DIAGNOSIS — E876 Hypokalemia: Secondary | ICD-10-CM

## 2020-02-17 DIAGNOSIS — Z5111 Encounter for antineoplastic chemotherapy: Secondary | ICD-10-CM | POA: Diagnosis not present

## 2020-02-17 DIAGNOSIS — Z95828 Presence of other vascular implants and grafts: Secondary | ICD-10-CM

## 2020-02-17 DIAGNOSIS — R634 Abnormal weight loss: Secondary | ICD-10-CM

## 2020-02-17 DIAGNOSIS — E861 Hypovolemia: Secondary | ICD-10-CM

## 2020-02-17 LAB — CBC WITH DIFFERENTIAL/PLATELET
Abs Immature Granulocytes: 0.03 10*3/uL (ref 0.00–0.07)
Basophils Absolute: 0 10*3/uL (ref 0.0–0.1)
Basophils Relative: 1 %
Eosinophils Absolute: 0.2 10*3/uL (ref 0.0–0.5)
Eosinophils Relative: 2 %
HCT: 34.1 % — ABNORMAL LOW (ref 39.0–52.0)
Hemoglobin: 11.3 g/dL — ABNORMAL LOW (ref 13.0–17.0)
Immature Granulocytes: 1 %
Lymphocytes Relative: 25 %
Lymphs Abs: 1.6 10*3/uL (ref 0.7–4.0)
MCH: 32.9 pg (ref 26.0–34.0)
MCHC: 33.1 g/dL (ref 30.0–36.0)
MCV: 99.4 fL (ref 80.0–100.0)
Monocytes Absolute: 0.7 10*3/uL (ref 0.1–1.0)
Monocytes Relative: 11 %
Neutro Abs: 3.8 10*3/uL (ref 1.7–7.7)
Neutrophils Relative %: 60 %
Platelets: 207 10*3/uL (ref 150–400)
RBC: 3.43 MIL/uL — ABNORMAL LOW (ref 4.22–5.81)
RDW: 15.1 % (ref 11.5–15.5)
WBC: 6.2 10*3/uL (ref 4.0–10.5)
nRBC: 0 % (ref 0.0–0.2)

## 2020-02-17 LAB — COMPREHENSIVE METABOLIC PANEL
ALT: 12 U/L (ref 0–44)
AST: 18 U/L (ref 15–41)
Albumin: 3.6 g/dL (ref 3.5–5.0)
Alkaline Phosphatase: 64 U/L (ref 38–126)
Anion gap: 11 (ref 5–15)
BUN: 18 mg/dL (ref 8–23)
CO2: 26 mmol/L (ref 22–32)
Calcium: 8.8 mg/dL — ABNORMAL LOW (ref 8.9–10.3)
Chloride: 105 mmol/L (ref 98–111)
Creatinine, Ser: 0.8 mg/dL (ref 0.61–1.24)
GFR calc Af Amer: >60 mL/min (ref >60)
GFR calc non Af Amer: >60 mL/min (ref >60)
Glucose, Bld: 98 mg/dL (ref 70–99)
Potassium: 3.1 mmol/L — ABNORMAL LOW (ref 3.5–5.1)
Sodium: 142 mmol/L (ref 135–145)
Total Bilirubin: 0.7 mg/dL (ref 0.3–1.2)
Total Protein: 6.6 g/dL (ref 6.5–8.1)

## 2020-02-17 MED ORDER — SODIUM CHLORIDE 0.9 % IV SOLN
Freq: Once | INTRAVENOUS | Status: AC
  Filled 2020-02-17: qty 250

## 2020-02-17 MED ORDER — SODIUM CHLORIDE 0.9 % IV SOLN
200.0000 mg | Freq: Once | INTRAVENOUS | Status: AC
  Administered 2020-02-17: 200 mg via INTRAVENOUS
  Filled 2020-02-17: qty 8

## 2020-02-17 MED ORDER — POTASSIUM CHLORIDE 20 MEQ PO PACK
20.0000 meq | PACK | Freq: Every day | ORAL | 1 refills | Status: DC
Start: 2020-02-17 — End: 2020-08-24

## 2020-02-17 MED ORDER — PROCHLORPERAZINE EDISYLATE 10 MG/2ML IJ SOLN
10.0000 mg | Freq: Once | INTRAMUSCULAR | Status: AC
  Administered 2020-02-17: 10 mg via INTRAVENOUS
  Filled 2020-02-17: qty 2

## 2020-02-17 MED ORDER — SODIUM CHLORIDE 0.9 % IV SOLN
Freq: Once | INTRAVENOUS | Status: AC
  Filled 2020-02-17: qty 1000

## 2020-02-17 MED ORDER — HEPARIN SOD (PORK) LOCK FLUSH 100 UNIT/ML IV SOLN
500.0000 [IU] | Freq: Once | INTRAVENOUS | Status: AC | PRN
  Administered 2020-02-17: 500 [IU]
  Filled 2020-02-17: qty 5

## 2020-02-17 MED ORDER — SODIUM CHLORIDE 0.9% FLUSH
10.0000 mL | INTRAVENOUS | Status: DC | PRN
  Administered 2020-02-17: 10 mL via INTRAVENOUS
  Filled 2020-02-17: qty 10

## 2020-02-17 MED ORDER — HEPARIN SOD (PORK) LOCK FLUSH 100 UNIT/ML IV SOLN
INTRAVENOUS | Status: AC
  Filled 2020-02-17: qty 5

## 2020-02-17 NOTE — Progress Notes (Signed)
Pt states that sometimes he gets dizzy for a minute or 2. He has low b/p today but when he stood up he did not have dizziness. He feels that he is drinking and eating better but stil lost wt.

## 2020-02-20 NOTE — Progress Notes (Signed)
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  Discuss CT scan results  Heme/Onc history: patient is a 69 year old Caucasian male who was referred to Dr. Richardson Landry for symptoms of sore throat and difficulty swallowing which she has been experiencing over the last 1 year.He underwent a comprehensive auto laryngoscopy exam which showed a firm enlarged right tonsillar mass with necrotic area extending onto the adjacent soft palate into the root of the uvula. Hypopharynx and larynx as well as nasopharynx was unable to be visualized due to gag reflex. This was biopsied and was consistent with nonkeratinizing invasive squamous cell carcinoma positive for p16.PDL 1 70%  PET CT scan showed large hypermetabolic right palatine tonsil mass which crosses the midline. Ipsilateral cervical nodal metastases. Bilateral pulmonary nodules including a dominant 1 cm hypermetabolic right lung base nodule. Multiplicity favor pulmonary metastases. Subcarinal and equivocal left hilar nodal metastases. Hypermetabolism involving the ascending colon. Question mild diverticulitis versus underlying colonic mass or polyp cannot be excluded  Patient underwent EBUS guided biopsy ofhilarlymph nodes. Biopsy showed squamous cell carcinoma. However HPV/p16 status was uninterpretable.  Interval history-reports no difficulty swallowing at this time.  He is significantly fatigued and at times feels dizzy.  He continues to  lose weight and today weighs 177 pounds as compared to 193 pounds 6 weeks ago.  ECOG PS- 1 Pain scale- 0 Opioid associated constipation- no  Review of systems- Review of Systems  Constitutional: Positive for malaise/fatigue. Negative for chills, fever and weight loss.  HENT: Negative for congestion, ear discharge and nosebleeds.   Eyes: Negative for blurred vision.  Respiratory: Negative for cough, hemoptysis, sputum production, shortness of breath and wheezing.   Cardiovascular: Negative for chest pain, palpitations, orthopnea and claudication.  Gastrointestinal: Negative for abdominal pain, blood in stool, constipation, diarrhea, heartburn, melena, nausea and vomiting.  Genitourinary: Negative for dysuria, flank pain, frequency, hematuria and urgency.  Musculoskeletal: Negative for back pain, joint pain and myalgias.  Skin: Negative for rash.  Neurological: Negative for dizziness, tingling, focal weakness, seizures, weakness and headaches.  Endo/Heme/Allergies: Does not bruise/bleed easily.  Psychiatric/Behavioral: Negative for depression and suicidal ideas. The patient does not have insomnia.      No Known Allergies   Past Medical History:  Diagnosis Date  . Asthma   . Lung cancer ([place])   . Lung nodules   . Tonsil cancer [facility])      Past Surgical History:  Procedure Laterality Date  . CERVICAL SPINE SURGERY    . LUMBAR SPINE SURGERY    . PORTA CATH INSERTION N/A 10/08/2019   Procedure: PORTA CATH INSERTION;  Surgeon: Katha Cabal, MD;  Location: [place] [facility];  Service: Cardiovascular;  Laterality: N/A;  . VIDEO BRONCHOSCOPY WITH ENDOBRONCHIAL NAVIGATION N/A 10/10/2019   Procedure: VIDEO BRONCHOSCOPY WITH ENDOBRONCHIAL NAVIGATION;  Surgeon: Tyler Pita, MD;  Location: [facility];  Service: Pulmonary;  Laterality: N/A;  . VIDEO BRONCHOSCOPY WITH ENDOBRONCHIAL ULTRASOUND N/A 10/10/2019   Procedure: VIDEO BRONCHOSCOPY WITH ENDOBRONCHIAL ULTRASOUND;  Surgeon:  Patsey Berthold,  Lolita Cram, MD;  Location: [facility];  Service: Pulmonary;  Laterality: N/A;    Social History   Socioeconomic History  . Marital status: Widowed    Spouse name: Not on file  . Number of children: Not on file  . Years of education: Not on file  . Highest education level: Not on file  Occupational History  . Not on file  Tobacco Use  . Smoking status: Never Smoker  . Smokeless tobacco: Never Used  Vaping Use  . Vaping Use: Never used  Substance and Sexual Activity  . Alcohol use: Never  . Drug use: Never  . Sexual activity: Not on file  Other Topics Concern  . Not on file  Social History Narrative  . Not on file   Social Determinants of Health   Financial Resource Strain:   . Difficulty of Paying Living Expenses:   Food Insecurity:   . Worried About Charity fundraiser in the Last Year:   . Arboriculturist in the Last Year:   Transportation Needs:   . Film/video editor (Medical):   Marland Kitchen Lack of Transportation (Non-Medical):   Physical Activity:   . Days of Exercise per Week:   . Minutes of Exercise per Session:   Stress:   . Feeling of Stress :   Social Connections:   . Frequency of Communication with Friends and Family:   . Frequency of Social Gatherings with Friends and Family:   . Attends Religious Services:   . Active Member of Clubs or Organizations:   . Attends Archivist Meetings:   Marland Kitchen Marital Status:   Intimate Partner Violence:   . Fear of Current or Ex-Partner:   . Emotionally Abused:   Marland Kitchen Physically Abused:   . Sexually Abused:     Family History  Problem Relation Age of Onset  . Cancer Mother 82       not sure what type  . Prostate cancer Father   . Rheum arthritis Father   . Healthy Sister   . Healthy Brother   . Healthy Sister   . Healthy Brother   . Healthy Brother   . Heart disease Brother   . Heart disease Brother      Current Outpatient Medications:  .  acetaminophen (TYLENOL) 500 MG tablet, Take 500-1,000 mg  by mouth every 8 (eight) hours as needed (for pain.). , Disp: , Rfl:  .  albuterol (VENTOLIN HFA) 108 (90 Base) MCG/ACT inhaler, Inhale 2 puffs into the lungs every 6 (six) hours as needed (wheezing/shortness of breath). , Disp: , Rfl:  .  dexamethasone (DECADRON) 4 MG tablet, Take 2 tablets by mouth once a day for 3 days after chemo. Take with food., Disp: 30 tablet, Rfl: 0 .  LORazepam (ATIVAN) 0.5 MG tablet, Take 1 tablet (0.5 mg total) by mouth every 6 (six) hours as needed (Nausea or vomiting)., Disp: 30 tablet, Rfl: 0 .  oxyCODONE (OXY IR/ROXICODONE) 5 MG immediate release tablet, Take 1 tablet (5 mg total) by mouth every 4 (four) hours as needed for severe pain., Disp: 60 tablet, Rfl: 0 .  SYMBICORT 160-4.5 MCG/ACT inhaler, Inhale 2 puffs into the lungs 2 (two) times daily., Disp: 1 Inhaler, Rfl: 2 .  lidocaine-prilocaine (EMLA) cream, Apply to affected area once (Patient not taking: Reported on 01/06/2020), Disp: 30 g, Rfl: 3 .  OLANZapine (ZYPREXA) 10 MG tablet, Take 1 tablet (10 mg total) by mouth at bedtime. (Patient not taking: Reported on  02/17/2020), Disp: 30 tablet, Rfl: 2 .  ondansetron (ZOFRAN) 8 MG tablet, Take 1 tablet (8 mg total) by mouth 2 (two) times daily as needed. Start on the third day after chemotherapy. (Patient not taking: Reported on 02/17/2020), Disp: 30 tablet, Rfl: 1 .  potassium chloride (KLOR-CON) 20 MEQ packet, Take 20 mEq by mouth daily., Disp: 30 packet, Rfl: 1 .  prochlorperazine (COMPAZINE) 10 MG tablet, Take 1 tablet (10 mg total) by mouth every 6 (six) hours as needed (Nausea or vomiting). (Patient not taking: Reported on 01/27/2020), Disp: 30 tablet, Rfl: 1  Physical exam:  Vitals:   02/17/20 0900  BP: 98/76  Pulse: 79  Resp: 16  Temp: (!) 96.8 F (36 C)  TempSrc: Tympanic  Weight: 177 lb 12.8 oz (80.6 kg)   Physical Exam Constitutional:      General: He is not in acute distress. Cardiovascular:     Rate and Rhythm: Normal rate and regular rhythm.      Heart sounds: Normal heart sounds.  Pulmonary:     Effort: Pulmonary effort is normal.     Breath sounds: Normal breath sounds.  Abdominal:     General: Bowel sounds are normal.     Palpations: Abdomen is soft.  Skin:    General: Skin is warm and dry.  Neurological:     Mental Status: He is alert and oriented to person, place, and time.      CMP Latest Ref Rng & Units 02/17/2020  Glucose 70 - 99 mg/dL 98  BUN 8 - 23 mg/dL 18  Creatinine 0.61 - 1.24 mg/dL 0.80  Sodium 135 - 145 mmol/L 142  Potassium 3.5 - 5.1 mmol/L 3.1(L)  Chloride 98 - 111 mmol/L 105  CO2 22 - 32 mmol/L 26  Calcium 8.9 - 10.3 mg/dL 8.8(L)  Total Protein 6.5 - 8.1 g/dL 6.6  Total Bilirubin 0.3 - 1.2 mg/dL 0.7  Alkaline Phos 38 - 126 U/L 64  AST 15 - 41 U/L 18  ALT 0 - 44 U/L 12   CBC Latest Ref Rng & Units 02/17/2020  WBC 4.0 - 10.5 K/uL 6.2  Hemoglobin 13.0 - 17.0 g/dL 11.3(L)  Hematocrit 39 - 52 % 34.1(L)  Platelets 150 - 400 K/uL 207    No images are attached to the encounter.  CT SOFT TISSUE NECK W CONTRAST  Result Date: 02/10/2020 CLINICAL DATA:  69 year old male with treated right tonsillar carcinoma, lung cancer. Restaging. EXAM: CT NECK WITH CONTRAST TECHNIQUE: Multidetector CT imaging of the neck was performed using the standard protocol following the bolus administration of intravenous contrast. CONTRAST:  140mL OMNIPAQUE IOHEXOL 300 MG/ML SOLN in conjunction with contrast enhanced imaging of the chest, abdomen, and pelvis reported separately. COMPARISON:  CT Chest, Abdomen, and Pelvis today are reported separately. Neck CT 12/08/2019. FINDINGS: Pharynx and larynx: Larynx remains within normal limits. Asymmetric soft tissue loss along the right palatine tonsil appears stable with no residual mass or hyperenhancement. Stable left tonsil with postinflammatory calcifications. Adenoid soft tissues remain stable. Negative parapharyngeal and retropharyngeal spaces. Salivary glands: Negative sublingual  space. Negative submandibular and parotid glands. Thyroid: Negative. Lymph nodes: Further regression of malignant right level 2 and level 3 lymph nodes. The largest right level 2 node is now 13 mm short axis (17-19 mm previously). The largest right level 3 node is now 7 mm short axis, 10 mm previously. No increased cervical nodes, those on the left remain normal. Vascular: Stable right IJ Port-A-Cath. Major vascular structures in the  neck and at the skull base remain patent. Limited intracranial: Negative. Visualized orbits: Negative. Mastoids and visualized paranasal sinuses: Minor bubbly opacity in the paranasal sinuses. Tympanic cavities and mastoids remain clear. Skeleton: Intermittent dental caries. Chronic or less likely congenital C5-C6 vertebral ankylosis. Superimposed cervical spine degeneration. No acute or suspicious osseous lesion identified. Upper chest: Reported separately. IMPRESSION: 1. Stable and satisfactory appearance of the treated right palatine tonsil. Continued regression of malignant right level 2 and level 3 lymph nodes. NI-Rads category 1. 2.  CT Chest, Abdomen, and Pelvis today are reported separately. Electronically Signed   By: Genevie Ann M.D.   On: 02/10/2020 16:47   CT Chest W Contrast  Result Date: 02/10/2020 CLINICAL DATA:  Right tonsil cancer with pulmonary metastases, recently completed chemotherapy. Restaging. EXAM: CT CHEST, ABDOMEN, AND PELVIS WITH CONTRAST TECHNIQUE: Multidetector CT imaging of the chest, abdomen and pelvis was performed following the standard protocol during bolus administration of intravenous contrast. CONTRAST:  115mL OMNIPAQUE IOHEXOL 300 MG/ML  SOLN COMPARISON:  12/08/2019 chest CT.  09/30/2019 PET-CT. FINDINGS: CT CHEST FINDINGS Cardiovascular: Normal heart size. No significant pericardial effusion/thickening. Right internal jugular Port-A-Cath terminates at the cavoatrial junction. Atherosclerotic nonaneurysmal thoracic aorta. Normal caliber pulmonary  arteries. No central pulmonary emboli. Mediastinum/Nodes: No discrete thyroid nodules. Unremarkable esophagus. No pathologically enlarged axillary, mediastinal or hilar lymph nodes. Lungs/Pleura: No pneumothorax. No pleural effusion. Previously described tiny anterior right lower lobe 3 mm solid pulmonary nodule measures 2 mm on today's scan (series 5/image 86), slightly decreased. Indistinct 3 mm left upper lobe nodule (series 5/image 50), new. No acute consolidative airspace disease, lung masses or additional significant pulmonary nodules. Scattered parenchymal bands in the mid to lower lungs bilaterally are unchanged. Musculoskeletal: No aggressive appearing focal osseous lesions. Mild thoracic spondylosis. CT ABDOMEN PELVIS FINDINGS Hepatobiliary: Normal liver with no liver mass. Normal gallbladder with no radiopaque cholelithiasis. No biliary ductal dilatation. Pancreas: Normal, with no mass or duct dilation. Spleen: Normal size. No mass. Adrenals/Urinary Tract: Normal adrenals. No hydronephrosis. Simple exophytic 4.2 cm anterior lower right renal cyst. Additional subcentimeter hypodense renal cortical lesions in the lower right kidney are too small to characterize and require no follow-up. Bladder is not significantly distended. A tiny portion of the anterior bladder extends into moderate left inguinal hernia. No definite bladder wall thickening. Stomach/Bowel: Normal non-distended stomach. Normal caliber small bowel with no small bowel wall thickening. Normal appendix. Oral contrast transits to rectum. Moderate diffuse colonic diverticulosis, with no large bowel wall thickening or significant pericolonic fat stranding. Vascular/Lymphatic: Normal caliber abdominal aorta. Patent portal, splenic, hepatic and renal veins. No pathologically enlarged lymph nodes in the abdomen or pelvis. Reproductive: Mildly enlarged prostate with nonspecific internal prostatic calcifications. Other: No pneumoperitoneum, ascites or  focal fluid collection. Musculoskeletal: No aggressive appearing focal osseous lesions. Marked lumbar spondylosis. IMPRESSION: 1. Previously described tiny right lower lobe pulmonary nodule is decreased. 2. New tiny indistinct 3 mm left upper lobe nodule, equivocal, recommend attention on follow-up chest CT in 3 months. No additional potential findings of metastatic disease in the chest. 3. No evidence of metastatic disease in the abdomen or pelvis. 4. Chronic findings include: Moderate diffuse colonic diverticulosis. Mild prostatomegaly. Moderate left inguinal hernia containing a tiny portion of the bladder. Aortic Atherosclerosis (ICD10-I70.0). Electronically Signed   By: Ilona Sorrel M.D.   On: 02/10/2020 16:01   CT Abdomen Pelvis W Contrast  Result Date: 02/10/2020 CLINICAL DATA:  Right tonsil cancer with pulmonary metastases, recently completed chemotherapy. Restaging. EXAM: CT  CHEST, ABDOMEN, AND PELVIS WITH CONTRAST TECHNIQUE: Multidetector CT imaging of the chest, abdomen and pelvis was performed following the standard protocol during bolus administration of intravenous contrast. CONTRAST:  160mL OMNIPAQUE IOHEXOL 300 MG/ML  SOLN COMPARISON:  12/08/2019 chest CT.  09/30/2019 PET-CT. FINDINGS: CT CHEST FINDINGS Cardiovascular: Normal heart size. No significant pericardial effusion/thickening. Right internal jugular Port-A-Cath terminates at the cavoatrial junction. Atherosclerotic nonaneurysmal thoracic aorta. Normal caliber pulmonary arteries. No central pulmonary emboli. Mediastinum/Nodes: No discrete thyroid nodules. Unremarkable esophagus. No pathologically enlarged axillary, mediastinal or hilar lymph nodes. Lungs/Pleura: No pneumothorax. No pleural effusion. Previously described tiny anterior right lower lobe 3 mm solid pulmonary nodule measures 2 mm on today's scan (series 5/image 86), slightly decreased. Indistinct 3 mm left upper lobe nodule (series 5/image 50), new. No acute consolidative  airspace disease, lung masses or additional significant pulmonary nodules. Scattered parenchymal bands in the mid to lower lungs bilaterally are unchanged. Musculoskeletal: No aggressive appearing focal osseous lesions. Mild thoracic spondylosis. CT ABDOMEN PELVIS FINDINGS Hepatobiliary: Normal liver with no liver mass. Normal gallbladder with no radiopaque cholelithiasis. No biliary ductal dilatation. Pancreas: Normal, with no mass or duct dilation. Spleen: Normal size. No mass. Adrenals/Urinary Tract: Normal adrenals. No hydronephrosis. Simple exophytic 4.2 cm anterior lower right renal cyst. Additional subcentimeter hypodense renal cortical lesions in the lower right kidney are too small to characterize and require no follow-up. Bladder is not significantly distended. A tiny portion of the anterior bladder extends into moderate left inguinal hernia. No definite bladder wall thickening. Stomach/Bowel: Normal non-distended stomach. Normal caliber small bowel with no small bowel wall thickening. Normal appendix. Oral contrast transits to rectum. Moderate diffuse colonic diverticulosis, with no large bowel wall thickening or significant pericolonic fat stranding. Vascular/Lymphatic: Normal caliber abdominal aorta. Patent portal, splenic, hepatic and renal veins. No pathologically enlarged lymph nodes in the abdomen or pelvis. Reproductive: Mildly enlarged prostate with nonspecific internal prostatic calcifications. Other: No pneumoperitoneum, ascites or focal fluid collection. Musculoskeletal: No aggressive appearing focal osseous lesions. Marked lumbar spondylosis. IMPRESSION: 1. Previously described tiny right lower lobe pulmonary nodule is decreased. 2. New tiny indistinct 3 mm left upper lobe nodule, equivocal, recommend attention on follow-up chest CT in 3 months. No additional potential findings of metastatic disease in the chest. 3. No evidence of metastatic disease in the abdomen or pelvis. 4. Chronic  findings include: Moderate diffuse colonic diverticulosis. Mild prostatomegaly. Moderate left inguinal hernia containing a tiny portion of the bladder. Aortic Atherosclerosis (ICD10-I70.0). Electronically Signed   By: Ilona Sorrel M.D.   On: 02/10/2020 16:01     Assessment and plan- Patient is a 70 y.o. 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  He is here for on treatment assessment prior to cycle 1 of palliative Keytruda.  He is already finished 6 cycles of carbotaxol Keytruda chemotherapy  I have reviewed CT chest abdomen pelvis as well as CT neck Images independently and discussed with the patient.  He has had an excellent response to treatment so far.  Right sided cervical lymph nodes are further reduced in size.  There is no residual mass or hyperenhancement noted in the region of the right palatine tonsil.  He did have bulky mediastinal and hilar adenopathy which has now resolved.  Bilateral lung nodules have also decreased in size.  He was noted to have a 1 new 3 mm indistinct left upper lobe nodule which we will continue to monitor.  At this time plan is to drop the [place] and Taxol and continue single agent palliative Keytruda until progression or toxicity.  I will see him back in 3 weeks with CBC with differential CMP for cycle 2 of palliative Keytruda.  Patient does feel symptomatically improved when he receives IV fluids and we will plan to give him IV fluids today as well as next week and following week.  Hypokalemia: We will give him 20 mEq of IV potassium with his IV fluids today.  Weight loss: With resolution of difficulty swallowing and cessation of chemotherapy I am hoping that his symptoms get better.  I have encouraged him to drink his Ensure 2-3 times a day.  Continue to monitor   Visit Diagnosis 1. Encounter for antineoplastic immunotherapy   2. 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 Hypokalemia   4. Hypotension due to hypovolemia   5. Goals of care, counseling/discussion   6. Abnormal weight loss      Dr. Randa Evens, MD, MPH [facility] [phone] 02/20/2020 9:11 AM

## 2020-02-24 ENCOUNTER — Telehealth: Payer: Self-pay | Admitting: Oncology

## 2020-02-24 ENCOUNTER — Inpatient Hospital Stay: Payer: Medicare Other

## 2020-02-24 NOTE — Telephone Encounter (Signed)
Writer spoke with patient on this date and discussed moving patient's infusion appt to 03-03-20. Patient was agreeable with this.

## 2020-02-26 ENCOUNTER — Inpatient Hospital Stay: Payer: Medicare Other | Attending: Oncology

## 2020-02-26 DIAGNOSIS — Z79899 Other long term (current) drug therapy: Secondary | ICD-10-CM | POA: Insufficient documentation

## 2020-02-26 DIAGNOSIS — C771 Secondary and unspecified malignant neoplasm of intrathoracic lymph nodes: Secondary | ICD-10-CM | POA: Insufficient documentation

## 2020-02-26 DIAGNOSIS — C099 Malignant neoplasm of tonsil, unspecified: Secondary | ICD-10-CM | POA: Insufficient documentation

## 2020-02-26 DIAGNOSIS — Z5111 Encounter for antineoplastic chemotherapy: Secondary | ICD-10-CM | POA: Insufficient documentation

## 2020-02-26 DIAGNOSIS — R634 Abnormal weight loss: Secondary | ICD-10-CM | POA: Insufficient documentation

## 2020-02-26 NOTE — Progress Notes (Signed)
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  Patient receiving [place].   Spoke with patient via phone.  Reports that he is eating all he can.  Reports has eaten a ham biscuit today and drank an ensure. Has been up since 8:30-9.  Reports better nausea. Bowels are moving.  No trouble swallowing.       Medications: reviewed  Labs: reviewed  Anthropometrics:   Weight 177 lb 12.8 oz on 6/22 decreased from 183 lb   193 lb since 5/11   NUTRITION DIAGNOSIS: Inadequate oral intake continues   INTERVENTION:  Encouraged patient to eat within 1 hour of getting up (drink shake, peanut butter cracker, etc).  "I will if I can or if I am hungry." Encouraged patient to drink 350 calorie shake if tolerated.   Encouraged patient small snack/mini meal mid afternoon and or bedtime snack.   RD has discussed ways to add calories and protein to current diet on multiple occassions.  Consider trial of appetite stimulant  Patient has contact information    MONITORING, EVALUATION, GOAL: weight trends, intake   NEXT VISIT: July 29 phone f/u  Patrick Mendoza B. [name], [place], [place] Registered Dietitian [phone] (pager)

## 2020-03-02 ENCOUNTER — Inpatient Hospital Stay: Payer: Medicare Other

## 2020-03-03 ENCOUNTER — Other Ambulatory Visit: Payer: Self-pay

## 2020-03-03 ENCOUNTER — Inpatient Hospital Stay: Payer: Medicare Other

## 2020-03-03 VITALS — BP 110/77 | HR 88 | Temp 96.7°F | Resp 18

## 2020-03-03 DIAGNOSIS — C099 Malignant neoplasm of tonsil, unspecified: Secondary | ICD-10-CM | POA: Diagnosis present

## 2020-03-03 DIAGNOSIS — Z5111 Encounter for antineoplastic chemotherapy: Secondary | ICD-10-CM | POA: Diagnosis not present

## 2020-03-03 DIAGNOSIS — Z79899 Other long term (current) drug therapy: Secondary | ICD-10-CM | POA: Diagnosis not present

## 2020-03-03 DIAGNOSIS — C771 Secondary and unspecified malignant neoplasm of intrathoracic lymph nodes: Secondary | ICD-10-CM | POA: Diagnosis not present

## 2020-03-03 DIAGNOSIS — R634 Abnormal weight loss: Secondary | ICD-10-CM | POA: Diagnosis not present

## 2020-03-03 MED ORDER — SODIUM CHLORIDE 0.9% FLUSH
10.0000 mL | INTRAVENOUS | Status: DC | PRN
  Administered 2020-03-03: 10 mL via INTRAVENOUS
  Filled 2020-03-03: qty 10

## 2020-03-03 MED ORDER — SODIUM CHLORIDE 0.9 % IV SOLN
Freq: Once | INTRAVENOUS | Status: AC
  Administered 2020-03-03: 1000 mL via INTRAVENOUS
  Filled 2020-03-03: qty 250

## 2020-03-03 MED ORDER — HEPARIN SOD (PORK) LOCK FLUSH 100 UNIT/ML IV SOLN
500.0000 [IU] | Freq: Once | INTRAVENOUS | Status: AC
  Administered 2020-03-03: 500 [IU] via INTRAVENOUS
  Filled 2020-03-03: qty 5

## 2020-03-03 MED ORDER — HEPARIN SOD (PORK) LOCK FLUSH 100 UNIT/ML IV SOLN
INTRAVENOUS | Status: AC
  Filled 2020-03-03: qty 5

## 2020-03-09 ENCOUNTER — Encounter: Payer: Self-pay | Admitting: Pulmonary Disease

## 2020-03-09 ENCOUNTER — Inpatient Hospital Stay: Payer: Medicare Other

## 2020-03-09 ENCOUNTER — Ambulatory Visit: Payer: Medicare Other | Admitting: Pulmonary Disease

## 2020-03-09 ENCOUNTER — Encounter: Payer: Self-pay | Admitting: Oncology

## 2020-03-09 ENCOUNTER — Inpatient Hospital Stay (HOSPITAL_BASED_OUTPATIENT_CLINIC_OR_DEPARTMENT_OTHER): Payer: Medicare Other | Admitting: Oncology

## 2020-03-09 ENCOUNTER — Other Ambulatory Visit: Payer: Self-pay

## 2020-03-09 VITALS — BP 102/73 | HR 71 | Temp 95.9°F | Resp 18 | Wt 176.9 lb

## 2020-03-09 VITALS — BP 110/80 | HR 78 | Temp 98.0°F | Ht 70.0 in | Wt 176.8 lb

## 2020-03-09 DIAGNOSIS — Z5112 Encounter for antineoplastic immunotherapy: Secondary | ICD-10-CM | POA: Diagnosis not present

## 2020-03-09 DIAGNOSIS — C099 Malignant neoplasm of tonsil, unspecified: Secondary | ICD-10-CM

## 2020-03-09 DIAGNOSIS — Z5111 Encounter for antineoplastic chemotherapy: Secondary | ICD-10-CM | POA: Diagnosis not present

## 2020-03-09 DIAGNOSIS — J4520 Mild intermittent asthma, uncomplicated: Secondary | ICD-10-CM

## 2020-03-09 DIAGNOSIS — Z95828 Presence of other vascular implants and grafts: Secondary | ICD-10-CM

## 2020-03-09 LAB — CBC WITH DIFFERENTIAL/PLATELET
Abs Immature Granulocytes: 0.02 10*3/uL (ref 0.00–0.07)
Basophils Absolute: 0 10*3/uL (ref 0.0–0.1)
Basophils Relative: 0 %
Eosinophils Absolute: 0.3 10*3/uL (ref 0.0–0.5)
Eosinophils Relative: 5 %
HCT: 30.4 % — ABNORMAL LOW (ref 39.0–52.0)
Hemoglobin: 10.2 g/dL — ABNORMAL LOW (ref 13.0–17.0)
Immature Granulocytes: 0 %
Lymphocytes Relative: 21 %
Lymphs Abs: 1.3 10*3/uL (ref 0.7–4.0)
MCH: 32.8 pg (ref 26.0–34.0)
MCHC: 33.6 g/dL (ref 30.0–36.0)
MCV: 97.7 fL (ref 80.0–100.0)
Monocytes Absolute: 0.5 10*3/uL (ref 0.1–1.0)
Monocytes Relative: 9 %
Neutro Abs: 3.9 10*3/uL (ref 1.7–7.7)
Neutrophils Relative %: 65 %
Platelets: 219 10*3/uL (ref 150–400)
RBC: 3.11 MIL/uL — ABNORMAL LOW (ref 4.22–5.81)
RDW: 14.7 % (ref 11.5–15.5)
WBC: 6 10*3/uL (ref 4.0–10.5)
nRBC: 0 % (ref 0.0–0.2)

## 2020-03-09 LAB — COMPREHENSIVE METABOLIC PANEL
ALT: 10 U/L (ref 0–44)
AST: 16 U/L (ref 15–41)
Albumin: 3.5 g/dL (ref 3.5–5.0)
Alkaline Phosphatase: 59 U/L (ref 38–126)
Anion gap: 8 (ref 5–15)
BUN: 13 mg/dL (ref 8–23)
CO2: 28 mmol/L (ref 22–32)
Calcium: 8.9 mg/dL (ref 8.9–10.3)
Chloride: 105 mmol/L (ref 98–111)
Creatinine, Ser: 0.73 mg/dL (ref 0.61–1.24)
GFR calc Af Amer: >60 mL/min (ref >60)
GFR calc non Af Amer: >60 mL/min (ref >60)
Glucose, Bld: 94 mg/dL (ref 70–99)
Potassium: 3.4 mmol/L — ABNORMAL LOW (ref 3.5–5.1)
Sodium: 141 mmol/L (ref 135–145)
Total Bilirubin: 0.4 mg/dL (ref 0.3–1.2)
Total Protein: 6.7 g/dL (ref 6.5–8.1)

## 2020-03-09 MED ORDER — HEPARIN SOD (PORK) LOCK FLUSH 100 UNIT/ML IV SOLN
500.0000 [IU] | Freq: Once | INTRAVENOUS | Status: AC | PRN
  Administered 2020-03-09: 500 [IU]
  Filled 2020-03-09: qty 5

## 2020-03-09 MED ORDER — PROCHLORPERAZINE EDISYLATE 10 MG/2ML IJ SOLN
10.0000 mg | Freq: Once | INTRAMUSCULAR | Status: AC
  Administered 2020-03-09: 10 mg via INTRAVENOUS
  Filled 2020-03-09: qty 2

## 2020-03-09 MED ORDER — SODIUM CHLORIDE 0.9 % IV SOLN
200.0000 mg | Freq: Once | INTRAVENOUS | Status: AC
  Administered 2020-03-09: 200 mg via INTRAVENOUS
  Filled 2020-03-09: qty 8

## 2020-03-09 MED ORDER — SODIUM CHLORIDE 0.9% FLUSH
10.0000 mL | INTRAVENOUS | Status: AC | PRN
  Administered 2020-03-09: 10 mL via INTRAVENOUS
  Filled 2020-03-09: qty 10

## 2020-03-09 MED ORDER — HEPARIN SOD (PORK) LOCK FLUSH 100 UNIT/ML IV SOLN
INTRAVENOUS | Status: AC
  Filled 2020-03-09: qty 5

## 2020-03-09 MED ORDER — SODIUM CHLORIDE 0.9 % IV SOLN
Freq: Once | INTRAVENOUS | Status: AC
  Filled 2020-03-09: qty 250

## 2020-03-09 NOTE — Patient Instructions (Signed)
Follow-up in 6 months time call sooner should any new problems arise.

## 2020-03-09 NOTE — Progress Notes (Signed)
Subjective:    Patient ID: Patrick Mendoza., male    DOB: 1950-12-15, 69 y.o.   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  CAT scan of 15 June showed good response to therapy.  He presents today for follow-up.  He has a history of asthma but is well controlled on Symbicort.  Voices no complaint today.  He is doing well.  Appetite has been regained.  No weight loss.  He denies any dyspnea, paroxysmal nocturnal dyspnea, orthopnea or lower extremity edema.  No chest pain.  No cough.  Overall he feels well and looks well.  Review of Systems A 10 point review of systems was performed and it is as noted above otherwise negative.  No Known Allergies  Current Meds  Medication Sig  . acetaminophen (TYLENOL) 500 MG tablet Take 500-1,000 mg by mouth every 8 (eight) hours as needed (for pain.).   Marland Kitchen albuterol (VENTOLIN HFA) 108 (90 Base) MCG/ACT inhaler Inhale 2 puffs into the lungs every 6 (six) hours as needed (wheezing/shortness of breath).   Marland Kitchen dexamethasone (DECADRON) 4 MG tablet Take 2 tablets by mouth once a day for 3 days after chemo. Take with food.  . lidocaine-prilocaine (EMLA) cream Apply to affected area once  . LORazepam (ATIVAN) 0.5 MG tablet Take 1 tablet (0.5 mg total) by mouth every 6 (six) hours as needed (Nausea or vomiting).  . OLANZapine (ZYPREXA) 10 MG tablet Take 1 tablet (10 mg total) by mouth at bedtime.  . ondansetron (ZOFRAN) 8 MG tablet Take 1 tablet (8 mg total) by mouth 2 (two) times daily as needed. Start on the third day after chemotherapy.  Marland Kitchen oxyCODONE (OXY IR/ROXICODONE) 5 MG immediate release tablet Take 1 tablet (5 mg total) by mouth every 4 (four) hours as needed for severe pain.  . potassium chloride (KLOR-CON) 20 MEQ packet Take 20 mEq by mouth daily.  . prochlorperazine  (COMPAZINE) 10 MG tablet Take 1 tablet (10 mg total) by mouth every 6 (six) hours as needed (Nausea or vomiting).  . SYMBICORT 160-4.5 MCG/ACT inhaler Inhale 2 puffs into the lungs 2 (two) times daily.       Objective:   Physical Exam BP 110/80 (BP Location: Right Arm, Cuff Size: Normal)   Pulse 78   Temp 98 F (36.7 C) (Oral)   Ht 5\' 10"  (1.778 m)   Wt 176 lb 12.8 oz (80.2 kg)   SpO2 96%   BMI 25.37 kg/m   GENERAL: Well-developed well-nourished gentleman in no acute distress, alopecia due to chemotherapy.  Fully ambulatory. HEAD: Normocephalic, atraumatic.  EYES: Pupils equal, round, reactive to light.  No scleral icterus.  MOUTH: Nose/mouth/throat not examined due to masking requirements for COVID 19. NECK: Supple. No thyromegaly. Trachea midline. No JVD.  No adenopathy. PULMONARY: Lungs clear to auscultation bilaterally.  Good air entry bilaterally. CARDIOVASCULAR: S1 and S2. Regular rate and rhythm.  No rubs, murmurs or gallops heard. GASTROINTESTINAL: No overt distention. MUSCULOSKELETAL: No joint deformity, no clubbing, no edema.  NEUROLOGIC: No focal deficit noted.  Speech is fluent.  No gait disturbance noted. SKIN: Intact,warm,dry.  On limited exam no rashes. PSYCH: Mood and behavior appropriate.     Assessment & Plan:     ICD-10-CM   1. 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 Mild intermittent asthma without complication  K10.31    Well compensated, no recent exacerbations Tolerated chemo without exacerbation of asthma Continue Symbicort and as needed albuterol.   Discussion:  Patient is doing well, recommended to continue Symbicort and albuterol as needed.  Follow-up in 6 months time he is to contact us prior to that time should any new difficulties arise.   Renold Don, MD [facility]   *This note was dictated using  voice recognition software/Dragon.  Despite best efforts to proofread, errors can occur which can change the meaning.  Any change was purely unintentional.

## 2020-03-09 NOTE — Progress Notes (Signed)
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 Richardson Landry for symptoms of sore throat and difficulty swallowing which she has been experiencing over the last 1 year.He underwent a comprehensive auto laryngoscopy exam which showed a firm enlarged right tonsillar mass with necrotic area extending onto the adjacent soft palate into the root of the uvula. Hypopharynx and larynx as well as nasopharynx was unable to be visualized due to gag reflex. This was biopsied and was consistent with nonkeratinizing invasive squamous cell carcinoma positive for p16.PDL 1 70%  PET CT scan showed large hypermetabolic right palatine tonsil mass which crosses the midline. Ipsilateral cervical nodal metastases. Bilateral pulmonary nodules including a dominant 1 cm hypermetabolic right lung base nodule. Multiplicity favor pulmonary metastases. Subcarinal and equivocal left hilar nodal metastases. Hypermetabolism involving the ascending colon. Question mild diverticulitis versus underlying colonic mass or polyp cannot be excluded  Patient underwent EBUS guided biopsy ofhilarlymph nodes. Biopsy showed squamous cell carcinoma. However HPV/p16 status was uninterpretable.  Patient completed 6 cycles of carbotaxol chemotherapy along with Keytruda and is currently on maintenance Keytruda.  Scans show far for excellent response to  treatment  Interval history-reports that his taste sensation is coming back and his appetite is slowly improving.  He has not lost further weight in the last 3 weeks.  He wishes to hold off on any appetite stimulant medications at this time.  ECOG PS- 1 Pain scale- 0   Review of systems- Review of Systems  Constitutional: Positive for malaise/fatigue. Negative for chills, fever and weight loss.       Lack of appetite  HENT: Negative for congestion, ear discharge and nosebleeds.   Eyes: Negative for blurred vision.  Respiratory: Negative for cough, hemoptysis, sputum production, shortness of breath and wheezing.   Cardiovascular: Negative for chest pain, palpitations, orthopnea and claudication.  Gastrointestinal: Negative for abdominal pain, blood in stool, constipation, diarrhea, heartburn, melena, nausea and vomiting.  Genitourinary: Negative for dysuria, flank pain, frequency, hematuria and urgency.  Musculoskeletal: Negative for back pain, joint pain and myalgias.  Skin: Negative for rash.  Neurological: Negative for dizziness, tingling, focal weakness, seizures, weakness and headaches.  Endo/Heme/Allergies: Does not bruise/bleed easily.  Psychiatric/Behavioral: Negative for depression and suicidal ideas. The patient does not have insomnia.       No Known Allergies   Past Medical History:  Diagnosis Date  . Asthma   . Lung cancer ([place])   . Lung nodules   . Tonsil cancer [facility])      Past Surgical History:  Procedure Laterality Date  . CERVICAL SPINE SURGERY    . LUMBAR SPINE SURGERY    . PORTA CATH INSERTION N/A 10/08/2019   Procedure: PORTA CATH INSERTION;  Surgeon: Katha Cabal, MD;  Location: [facility] [place];  Service: Cardiovascular;  Laterality: N/A;  . VIDEO BRONCHOSCOPY WITH ENDOBRONCHIAL NAVIGATION N/A 10/10/2019   Procedure: VIDEO BRONCHOSCOPY WITH ENDOBRONCHIAL NAVIGATION;  Surgeon: Tyler Pita, MD;  Location: [facility];  Service: Pulmonary;   Laterality: N/A;  . VIDEO BRONCHOSCOPY WITH ENDOBRONCHIAL ULTRASOUND N/A 10/10/2019   Procedure: VIDEO BRONCHOSCOPY WITH ENDOBRONCHIAL ULTRASOUND;  Surgeon: Tyler Pita, MD;  Location: [facility];  Service: Pulmonary;  Laterality: N/A;    Social History   Socioeconomic History  . Marital status: Widowed    Spouse name: Not on file  . Number of children: Not on file  . Years of education: Not on file  . Highest education level: Not on file  Occupational History  . Not on file  Tobacco Use  . Smoking status: Never Smoker  . Smokeless tobacco: Never Used  Vaping Use  . Vaping Use: Never used  Substance and Sexual Activity  . Alcohol use: Never  . Drug use: Never  . Sexual activity: Not on file  Other Topics Concern  . Not on file  Social History Narrative  . Not on file   Social Determinants of Health   Financial Resource Strain:   . Difficulty of Paying Living Expenses:   Food Insecurity:   . Worried About Charity fundraiser in the Last Year:   . Arboriculturist in the Last Year:   Transportation Needs:   . Film/video editor (Medical):   Marland Kitchen Lack of Transportation (Non-Medical):   Physical Activity:   . Days of Exercise per Week:   . Minutes of Exercise per Session:   Stress:   . Feeling of Stress :   Social Connections:   . Frequency of Communication with Friends and Family:   . Frequency of Social Gatherings with Friends and Family:   . Attends Religious Services:   . Active Member of Clubs or Organizations:   . Attends Archivist Meetings:   Marland Kitchen Marital Status:   Intimate Partner Violence:   . Fear of Current or Ex-Partner:   . Emotionally Abused:   Marland Kitchen Physically Abused:   . Sexually Abused:     Family History  Problem Relation Age of Onset  . Cancer Mother 71       not sure what type  . Prostate cancer Father   . Rheum arthritis Father   . Healthy Sister   . Healthy Brother   . Healthy Sister   . Healthy Brother   . Healthy Brother    . Heart disease Brother   . Heart disease Brother      Current Outpatient Medications:  .  acetaminophen (TYLENOL) 500 MG tablet, Take 500-1,000 mg by mouth every 8 (eight) hours as needed (for pain.). , Disp: , Rfl:  .  albuterol (VENTOLIN HFA) 108 (90 Base) MCG/ACT inhaler, Inhale 2 puffs into the lungs every 6 (six) hours as needed (wheezing/shortness of breath). , Disp: , Rfl:  .  dexamethasone (DECADRON) 4 MG tablet, Take 2 tablets by mouth once a day for 3 days after chemo. Take with food., Disp: 30 tablet, Rfl: 0 .  lidocaine-prilocaine (EMLA) cream, Apply to affected area once, Disp: 30 g, Rfl: 3 .  LORazepam (ATIVAN) 0.5 MG tablet, Take 1 tablet (0.5 mg total) by mouth every 6 (six) hours as needed (Nausea or vomiting)., Disp: 30 tablet, Rfl: 0 .  OLANZapine (ZYPREXA) 10 MG tablet, Take 1 tablet (10 mg total) by mouth at bedtime., Disp: 30 tablet, Rfl: 2 .  ondansetron (ZOFRAN) 8 MG tablet, Take 1 tablet (8 mg total) by mouth 2 (two) times daily as needed. Start on the third day after chemotherapy., Disp:  30 tablet, Rfl: 1 .  oxyCODONE (OXY IR/ROXICODONE) 5 MG immediate release tablet, Take 1 tablet (5 mg total) by mouth every 4 (four) hours as needed for severe pain., Disp: 60 tablet, Rfl: 0 .  potassium chloride (KLOR-CON) 20 MEQ packet, Take 20 mEq by mouth daily., Disp: 30 packet, Rfl: 1 .  prochlorperazine (COMPAZINE) 10 MG tablet, Take 1 tablet (10 mg total) by mouth every 6 (six) hours as needed (Nausea or vomiting)., Disp: 30 tablet, Rfl: 1 .  SYMBICORT 160-4.5 MCG/ACT inhaler, Inhale 2 puffs into the lungs 2 (two) times daily., Disp: 1 Inhaler, Rfl: 2 No current facility-administered medications for this visit.  Facility-Administered Medications Ordered in Other Visits:  .  heparin lock flush 100 unit/mL, 500 Units, Intracatheter, Once PRN, Sindy Guadeloupe, MD .  pembrolizumab [facility]) 200 mg in sodium chloride 0.9 % 50 mL chemo infusion, 200 mg, Intravenous, Once, Sindy Guadeloupe, MD .  sodium chloride flush (NS) 0.9 % injection 10 mL, 10 mL, Intravenous, PRN, Sindy Guadeloupe, MD, 10 mL at 03/09/20 0919  Physical exam:  Vitals:   03/09/20 0927  BP: 102/73  Pulse: 71  Resp: 18  Temp: (!) 95.9 F (35.5 C)  TempSrc: Tympanic  SpO2: 98%  Weight: 176 lb 14.4 oz (80.2 kg)   Physical Exam Cardiovascular:     Rate and Rhythm: Normal rate and regular rhythm.     Heart sounds: Normal heart sounds.  Pulmonary:     Effort: Pulmonary effort is normal.     Breath sounds: Normal breath sounds.  Abdominal:     General: Bowel sounds are normal.     Palpations: Abdomen is soft.  Lymphadenopathy:     Comments: There is a residual 2 cm hard neck mass/cervical lymph node.  No other areas of palpable adenopathy  Skin:    General: Skin is warm and dry.  Neurological:     Mental Status: He is alert and oriented to person, place, and time.      CMP Latest Ref Rng & Units 03/09/2020  Glucose 70 - 99 mg/dL 94  BUN 8 - 23 mg/dL 13  Creatinine 0.61 - 1.24 mg/dL 0.73  Sodium 135 - 145 mmol/L 141  Potassium 3.5 - 5.1 mmol/L 3.4(L)  Chloride 98 - 111 mmol/L 105  CO2 22 - 32 mmol/L 28  Calcium 8.9 - 10.3 mg/dL 8.9  Total Protein 6.5 - 8.1 g/dL 6.7  Total Bilirubin 0.3 - 1.2 mg/dL 0.4  Alkaline Phos 38 - 126 U/L 59  AST 15 - 41 U/L 16  ALT 0 - 44 U/L 10   CBC Latest Ref Rng & Units 03/09/2020  WBC 4.0 - 10.5 K/uL 6.0  Hemoglobin 13.0 - 17.0 g/dL 10.2(L)  Hematocrit 39 - 52 % 30.4(L)  Platelets 150 - 400 K/uL 219    No images are attached to the encounter.  CT SOFT TISSUE NECK W CONTRAST  Result Date: 02/10/2020 CLINICAL DATA:  69 year old male with treated right tonsillar carcinoma, lung cancer. Restaging. EXAM: CT NECK WITH CONTRAST TECHNIQUE: Multidetector CT imaging of the neck was performed using the standard protocol following the bolus administration of intravenous contrast. CONTRAST:  158mL OMNIPAQUE IOHEXOL 300 MG/ML SOLN in conjunction with contrast  enhanced imaging of the chest, abdomen, and pelvis reported separately. COMPARISON:  CT Chest, Abdomen, and Pelvis today are reported separately. Neck CT 12/08/2019. FINDINGS: Pharynx and larynx: Larynx remains within normal limits. Asymmetric soft tissue loss along the right palatine tonsil appears  stable with no residual mass or hyperenhancement. Stable left tonsil with postinflammatory calcifications. Adenoid soft tissues remain stable. Negative parapharyngeal and retropharyngeal spaces. Salivary glands: Negative sublingual space. Negative submandibular and parotid glands. Thyroid: Negative. Lymph nodes: Further regression of malignant right level 2 and level 3 lymph nodes. The largest right level 2 node is now 13 mm short axis (17-19 mm previously). The largest right level 3 node is now 7 mm short axis, 10 mm previously. No increased cervical nodes, those on the left remain normal. Vascular: Stable right IJ Port-A-Cath. Major vascular structures in the neck and at the skull base remain patent. Limited intracranial: Negative. Visualized orbits: Negative. Mastoids and visualized paranasal sinuses: Minor bubbly opacity in the paranasal sinuses. Tympanic cavities and mastoids remain clear. Skeleton: Intermittent dental caries. Chronic or less likely congenital C5-C6 vertebral ankylosis. Superimposed cervical spine degeneration. No acute or suspicious osseous lesion identified. Upper chest: Reported separately. IMPRESSION: 1. Stable and satisfactory appearance of the treated right palatine tonsil. Continued regression of malignant right level 2 and level 3 lymph nodes. NI-Rads category 1. 2.  CT Chest, Abdomen, and Pelvis today are reported separately. Electronically Signed   By: Genevie Ann M.D.   On: 02/10/2020 16:47   CT Chest W Contrast  Result Date: 02/10/2020 CLINICAL DATA:  Right tonsil cancer with pulmonary metastases, recently completed chemotherapy. Restaging. EXAM: CT CHEST, ABDOMEN, AND PELVIS WITH  CONTRAST TECHNIQUE: Multidetector CT imaging of the chest, abdomen and pelvis was performed following the standard protocol during bolus administration of intravenous contrast. CONTRAST:  121mL OMNIPAQUE IOHEXOL 300 MG/ML  SOLN COMPARISON:  12/08/2019 chest CT.  09/30/2019 PET-CT. FINDINGS: CT CHEST FINDINGS Cardiovascular: Normal heart size. No significant pericardial effusion/thickening. Right internal jugular Port-A-Cath terminates at the cavoatrial junction. Atherosclerotic nonaneurysmal thoracic aorta. Normal caliber pulmonary arteries. No central pulmonary emboli. Mediastinum/Nodes: No discrete thyroid nodules. Unremarkable esophagus. No pathologically enlarged axillary, mediastinal or hilar lymph nodes. Lungs/Pleura: No pneumothorax. No pleural effusion. Previously described tiny anterior right lower lobe 3 mm solid pulmonary nodule measures 2 mm on today's scan (series 5/image 86), slightly decreased. Indistinct 3 mm left upper lobe nodule (series 5/image 50), new. No acute consolidative airspace disease, lung masses or additional significant pulmonary nodules. Scattered parenchymal bands in the mid to lower lungs bilaterally are unchanged. Musculoskeletal: No aggressive appearing focal osseous lesions. Mild thoracic spondylosis. CT ABDOMEN PELVIS FINDINGS Hepatobiliary: Normal liver with no liver mass. Normal gallbladder with no radiopaque cholelithiasis. No biliary ductal dilatation. Pancreas: Normal, with no mass or duct dilation. Spleen: Normal size. No mass. Adrenals/Urinary Tract: Normal adrenals. No hydronephrosis. Simple exophytic 4.2 cm anterior lower right renal cyst. Additional subcentimeter hypodense renal cortical lesions in the lower right kidney are too small to characterize and require no follow-up. Bladder is not significantly distended. A tiny portion of the anterior bladder extends into moderate left inguinal hernia. No definite bladder wall thickening. Stomach/Bowel: Normal non-distended  stomach. Normal caliber small bowel with no small bowel wall thickening. Normal appendix. Oral contrast transits to rectum. Moderate diffuse colonic diverticulosis, with no large bowel wall thickening or significant pericolonic fat stranding. Vascular/Lymphatic: Normal caliber abdominal aorta. Patent portal, splenic, hepatic and renal veins. No pathologically enlarged lymph nodes in the abdomen or pelvis. Reproductive: Mildly enlarged prostate with nonspecific internal prostatic calcifications. Other: No pneumoperitoneum, ascites or focal fluid collection. Musculoskeletal: No aggressive appearing focal osseous lesions. Marked lumbar spondylosis. IMPRESSION: 1. Previously described tiny right lower lobe pulmonary nodule is decreased. 2. New tiny indistinct 3 mm  left upper lobe nodule, equivocal, recommend attention on follow-up chest CT in 3 months. No additional potential findings of metastatic disease in the chest. 3. No evidence of metastatic disease in the abdomen or pelvis. 4. Chronic findings include: Moderate diffuse colonic diverticulosis. Mild prostatomegaly. Moderate left inguinal hernia containing a tiny portion of the bladder. Aortic Atherosclerosis (ICD10-I70.0). Electronically Signed   By: Ilona Sorrel M.D.   On: 02/10/2020 16:01   CT Abdomen Pelvis W Contrast  Result Date: 02/10/2020 CLINICAL DATA:  Right tonsil cancer with pulmonary metastases, recently completed chemotherapy. Restaging. EXAM: CT CHEST, ABDOMEN, AND PELVIS WITH CONTRAST TECHNIQUE: Multidetector CT imaging of the chest, abdomen and pelvis was performed following the standard protocol during bolus administration of intravenous contrast. CONTRAST:  146mL OMNIPAQUE IOHEXOL 300 MG/ML  SOLN COMPARISON:  12/08/2019 chest CT.  09/30/2019 PET-CT. FINDINGS: CT CHEST FINDINGS Cardiovascular: Normal heart size. No significant pericardial effusion/thickening. Right internal jugular Port-A-Cath terminates at the cavoatrial junction.  Atherosclerotic nonaneurysmal thoracic aorta. Normal caliber pulmonary arteries. No central pulmonary emboli. Mediastinum/Nodes: No discrete thyroid nodules. Unremarkable esophagus. No pathologically enlarged axillary, mediastinal or hilar lymph nodes. Lungs/Pleura: No pneumothorax. No pleural effusion. Previously described tiny anterior right lower lobe 3 mm solid pulmonary nodule measures 2 mm on today's scan (series 5/image 86), slightly decreased. Indistinct 3 mm left upper lobe nodule (series 5/image 50), new. No acute consolidative airspace disease, lung masses or additional significant pulmonary nodules. Scattered parenchymal bands in the mid to lower lungs bilaterally are unchanged. Musculoskeletal: No aggressive appearing focal osseous lesions. Mild thoracic spondylosis. CT ABDOMEN PELVIS FINDINGS Hepatobiliary: Normal liver with no liver mass. Normal gallbladder with no radiopaque cholelithiasis. No biliary ductal dilatation. Pancreas: Normal, with no mass or duct dilation. Spleen: Normal size. No mass. Adrenals/Urinary Tract: Normal adrenals. No hydronephrosis. Simple exophytic 4.2 cm anterior lower right renal cyst. Additional subcentimeter hypodense renal cortical lesions in the lower right kidney are too small to characterize and require no follow-up. Bladder is not significantly distended. A tiny portion of the anterior bladder extends into moderate left inguinal hernia. No definite bladder wall thickening. Stomach/Bowel: Normal non-distended stomach. Normal caliber small bowel with no small bowel wall thickening. Normal appendix. Oral contrast transits to rectum. Moderate diffuse colonic diverticulosis, with no large bowel wall thickening or significant pericolonic fat stranding. Vascular/Lymphatic: Normal caliber abdominal aorta. Patent portal, splenic, hepatic and renal veins. No pathologically enlarged lymph nodes in the abdomen or pelvis. Reproductive: Mildly enlarged prostate with nonspecific  internal prostatic calcifications. Other: No pneumoperitoneum, ascites or focal fluid collection. Musculoskeletal: No aggressive appearing focal osseous lesions. Marked lumbar spondylosis. IMPRESSION: 1. Previously described tiny right lower lobe pulmonary nodule is decreased. 2. New tiny indistinct 3 mm left upper lobe nodule, equivocal, recommend attention on follow-up chest CT in 3 months. No additional potential findings of metastatic disease in the chest. 3. No evidence of metastatic disease in the abdomen or pelvis. 4. Chronic findings include: Moderate diffuse colonic diverticulosis. Mild prostatomegaly. Moderate left inguinal hernia containing a tiny portion of the bladder. Aortic Atherosclerosis (ICD10-I70.0). Electronically Signed   By: Ilona Sorrel M.D.   On: 02/10/2020 16:01     Assessment and plan- Patient is a 69 y.o. 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    He is s/p 6 cycles of carbotaxol and Keytruda and therefore on treatment assessment prior to cycle 2 of palliative Keytruda  Counts okay to proceed with cycle  2 of palliative Keytruda today.  Scans so far have shown excellent response to treatment.  I am holding off on doing any additional radiation at this time and he will continue Keytruda until progression or toxicity.  Keytruda today.  Port labs CBC with differential, CMP, TSH in 3 weeks see covering NP and gets [facility] labs: CBC with differential, CMP in 6 weeks.  See Dr. Janese Banks gets Keytruda  Abnormal weight loss: Stable over the last 3 weeks.  Continue to monitor   Visit Diagnosis 1. Encounter for antineoplastic immunotherapy   2. Tonsil cancer [facility])      Dr. Randa Evens, MD, MPH [facility] [phone] 03/09/2020 10:29 AM

## 2020-03-25 ENCOUNTER — Inpatient Hospital Stay: Payer: Medicare Other

## 2020-03-25 NOTE — Progress Notes (Signed)
Nutrition  Called patient for scheduled nutrition follow-up via phone.  No answer.  Left message with call back number.  Tahiri Shareef B. [name], [place], [place] Registered Dietitian [phone] (mobile)

## 2020-03-30 ENCOUNTER — Ambulatory Visit: Payer: Medicare Other | Admitting: Nurse Practitioner

## 2020-03-30 ENCOUNTER — Other Ambulatory Visit: Payer: Self-pay

## 2020-03-30 ENCOUNTER — Encounter: Payer: Self-pay | Admitting: Oncology

## 2020-03-30 ENCOUNTER — Inpatient Hospital Stay: Payer: Medicare Other | Attending: Oncology

## 2020-03-30 ENCOUNTER — Inpatient Hospital Stay: Payer: Medicare Other

## 2020-03-30 ENCOUNTER — Inpatient Hospital Stay (HOSPITAL_BASED_OUTPATIENT_CLINIC_OR_DEPARTMENT_OTHER): Payer: Medicare Other | Admitting: Oncology

## 2020-03-30 VITALS — BP 114/74 | HR 64 | Temp 96.9°F | Resp 17 | Ht 70.0 in | Wt 170.7 lb

## 2020-03-30 DIAGNOSIS — C771 Secondary and unspecified malignant neoplasm of intrathoracic lymph nodes: Secondary | ICD-10-CM | POA: Insufficient documentation

## 2020-03-30 DIAGNOSIS — C099 Malignant neoplasm of tonsil, unspecified: Secondary | ICD-10-CM

## 2020-03-30 DIAGNOSIS — Z79899 Other long term (current) drug therapy: Secondary | ICD-10-CM | POA: Insufficient documentation

## 2020-03-30 DIAGNOSIS — C7800 Secondary malignant neoplasm of unspecified lung: Secondary | ICD-10-CM | POA: Diagnosis not present

## 2020-03-30 DIAGNOSIS — Z5112 Encounter for antineoplastic immunotherapy: Secondary | ICD-10-CM

## 2020-03-30 DIAGNOSIS — E876 Hypokalemia: Secondary | ICD-10-CM | POA: Insufficient documentation

## 2020-03-30 DIAGNOSIS — R1310 Dysphagia, unspecified: Secondary | ICD-10-CM | POA: Diagnosis not present

## 2020-03-30 LAB — CBC WITH DIFFERENTIAL/PLATELET
Abs Immature Granulocytes: 0.01 10*3/uL (ref 0.00–0.07)
Basophils Absolute: 0 10*3/uL (ref 0.0–0.1)
Basophils Relative: 1 %
Eosinophils Absolute: 0.2 10*3/uL (ref 0.0–0.5)
Eosinophils Relative: 4 %
HCT: 34.6 % — ABNORMAL LOW (ref 39.0–52.0)
Hemoglobin: 11.7 g/dL — ABNORMAL LOW (ref 13.0–17.0)
Immature Granulocytes: 0 %
Lymphocytes Relative: 24 %
Lymphs Abs: 1.6 10*3/uL (ref 0.7–4.0)
MCH: 33.1 pg (ref 26.0–34.0)
MCHC: 33.8 g/dL (ref 30.0–36.0)
MCV: 98 fL (ref 80.0–100.0)
Monocytes Absolute: 0.6 10*3/uL (ref 0.1–1.0)
Monocytes Relative: 9 %
Neutro Abs: 4 10*3/uL (ref 1.7–7.7)
Neutrophils Relative %: 62 %
Platelets: 226 10*3/uL (ref 150–400)
RBC: 3.53 MIL/uL — ABNORMAL LOW (ref 4.22–5.81)
RDW: 14.2 % (ref 11.5–15.5)
WBC: 6.3 10*3/uL (ref 4.0–10.5)
nRBC: 0 % (ref 0.0–0.2)

## 2020-03-30 LAB — COMPREHENSIVE METABOLIC PANEL
ALT: 9 U/L (ref 0–44)
AST: 18 U/L (ref 15–41)
Albumin: 4 g/dL (ref 3.5–5.0)
Alkaline Phosphatase: 66 U/L (ref 38–126)
Anion gap: 10 (ref 5–15)
BUN: 19 mg/dL (ref 8–23)
CO2: 26 mmol/L (ref 22–32)
Calcium: 8.9 mg/dL (ref 8.9–10.3)
Chloride: 106 mmol/L (ref 98–111)
Creatinine, Ser: 0.81 mg/dL (ref 0.61–1.24)
GFR calc Af Amer: >60 mL/min (ref >60)
GFR calc non Af Amer: >60 mL/min (ref >60)
Glucose, Bld: 104 mg/dL — ABNORMAL HIGH (ref 70–99)
Potassium: 3 mmol/L — ABNORMAL LOW (ref 3.5–5.1)
Sodium: 142 mmol/L (ref 135–145)
Total Bilirubin: 0.6 mg/dL (ref 0.3–1.2)
Total Protein: 7 g/dL (ref 6.5–8.1)

## 2020-03-30 LAB — TSH: TSH: 1.71 u[IU]/mL (ref 0.350–4.500)

## 2020-03-30 MED ORDER — SODIUM CHLORIDE 0.9 % IV SOLN
200.0000 mg | Freq: Once | INTRAVENOUS | Status: AC
  Administered 2020-03-30: 200 mg via INTRAVENOUS
  Filled 2020-03-30: qty 8

## 2020-03-30 MED ORDER — OXYCODONE HCL 5 MG PO TABS: 5.0000 mg | ORAL_TABLET | ORAL | 0 refills | Status: DC | PRN

## 2020-03-30 MED ORDER — PROCHLORPERAZINE EDISYLATE 10 MG/2ML IJ SOLN
10.0000 mg | Freq: Once | INTRAMUSCULAR | Status: AC
  Administered 2020-03-30: 10 mg via INTRAVENOUS
  Filled 2020-03-30: qty 2

## 2020-03-30 MED ORDER — HEPARIN SOD (PORK) LOCK FLUSH 100 UNIT/ML IV SOLN
INTRAVENOUS | Status: AC
  Filled 2020-03-30: qty 5

## 2020-03-30 MED ORDER — POTASSIUM CHLORIDE 20 MEQ/100ML IV SOLN
20.0000 meq | Freq: Once | INTRAVENOUS | Status: AC
  Administered 2020-03-30: 20 meq via INTRAVENOUS

## 2020-03-30 MED ORDER — POTASSIUM CHLORIDE 20 MEQ PO PACK: 20.0000 meq | PACK | Freq: Every day | ORAL | 0 refills | Status: DC

## 2020-03-30 MED ORDER — SODIUM CHLORIDE 0.9 % IV SOLN
Freq: Once | INTRAVENOUS | Status: AC
  Filled 2020-03-30: qty 250

## 2020-03-30 MED ORDER — SODIUM CHLORIDE 0.9% FLUSH
10.0000 mL | Freq: Once | INTRAVENOUS | Status: AC
  Administered 2020-03-30: 10 mL via INTRAVENOUS
  Filled 2020-03-30: qty 10

## 2020-03-30 MED ORDER — HEPARIN SOD (PORK) LOCK FLUSH 100 UNIT/ML IV SOLN
500.0000 [IU] | Freq: Once | INTRAVENOUS | Status: AC | PRN
  Administered 2020-03-30: 500 [IU]
  Filled 2020-03-30: qty 5

## 2020-03-30 MED ORDER — POTASSIUM CHLORIDE IN NACL 20-0.9 MEQ/L-% IV SOLN
Freq: Once | INTRAVENOUS | Status: DC
  Filled 2020-03-30: qty 1000

## 2020-03-30 NOTE — Progress Notes (Signed)
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 Richardson Landry for symptoms of sore throat and difficulty swallowing which she has been experiencing over the last 1 year.He underwent a comprehensive auto laryngoscopy exam which showed a firm enlarged right tonsillar mass with necrotic area extending onto the adjacent soft palate into the root of the uvula. Hypopharynx and larynx as well as nasopharynx was unable to be visualized due to gag reflex. This was biopsied and was consistent with nonkeratinizing invasive squamous cell carcinoma positive for p16.PDL 1 70%  PET CT scan showed large hypermetabolic right palatine tonsil mass which crosses the midline. Ipsilateral cervical nodal metastases. Bilateral pulmonary nodules including a dominant 1 cm hypermetabolic right lung base nodule. Multiplicity favor pulmonary metastases. Subcarinal and equivocal left hilar nodal metastases. Hypermetabolism involving the ascending colon. Question mild diverticulitis versus underlying colonic mass or polyp cannot be excluded  Patient underwent EBUS guided biopsy ofhilarlymph nodes. Biopsy showed squamous cell carcinoma. However HPV/p16 status was uninterpretable.  Patient completed 6 cycles of carbotaxol chemotherapy along with Keytruda and is currently on maintenance Keytruda.  Scans show far for excellent response to  treatment  Interval history-reports his appetite and taste is returning to normal.  He has lost an additional 6 pounds since his last visit.  He states he has been eating much better and is surprised his weight is down.  He continues to wish to hold off on appetite stimulant medications at this time.  ECOG PS- 1 Pain scale- 0   Review of systems- Review of Systems  Constitutional: Positive for malaise/fatigue and weight loss (Down 6 pounds). Negative for chills and fever.       Improving appetite- taste coming back  HENT: Negative for congestion, ear discharge and nosebleeds.   Eyes: Negative for blurred vision.  Respiratory: Negative for cough, hemoptysis, sputum production, shortness of breath and wheezing.   Cardiovascular: Negative for chest pain, palpitations, orthopnea and claudication.  Gastrointestinal: Negative for abdominal pain, blood in stool, constipation, diarrhea, heartburn, melena, nausea and vomiting.  Genitourinary: Negative for dysuria, flank pain, frequency, hematuria and urgency.  Musculoskeletal: Positive for back pain (Occasional). Negative for joint pain and myalgias.  Skin: Negative for rash.  Neurological: Negative for dizziness, tingling, focal weakness, seizures, weakness and headaches.  Endo/Heme/Allergies: Does not bruise/bleed easily.  Psychiatric/Behavioral: Negative for depression and suicidal ideas. The patient does not have insomnia.       No Known Allergies   Past Medical History:  Diagnosis Date  . Asthma   . Lung cancer ([place])   . Lung nodules   . Tonsil cancer [facility])      Past Surgical History:  Procedure Laterality Date  . CERVICAL SPINE SURGERY    . LUMBAR SPINE SURGERY    . PORTA CATH INSERTION N/A 10/08/2019   Procedure: PORTA CATH INSERTION;  Surgeon: Katha Cabal, MD;  Location: [place] [facility];  Service: Cardiovascular;  Laterality: N/A;  .  VIDEO BRONCHOSCOPY WITH ENDOBRONCHIAL NAVIGATION N/A 10/10/2019   Procedure:  VIDEO BRONCHOSCOPY WITH ENDOBRONCHIAL NAVIGATION;  Surgeon: Tyler Pita, MD;  Location: [facility] ORS;  Service: Pulmonary;  Laterality: N/A;  . VIDEO BRONCHOSCOPY WITH ENDOBRONCHIAL ULTRASOUND N/A 10/10/2019   Procedure: VIDEO BRONCHOSCOPY WITH ENDOBRONCHIAL ULTRASOUND;  Surgeon: Tyler Pita, MD;  Location: [facility] ORS;  Service: Pulmonary;  Laterality: N/A;    Social History   Socioeconomic History  . Marital status: Widowed    Spouse name: Not on file  . Number of children: Not on file  . Years of education: Not on file  . Highest education level: Not on file  Occupational History  . Not on file  Tobacco Use  . Smoking status: Never Smoker  . Smokeless tobacco: Never Used  Vaping Use  . Vaping Use: Never used  Substance and Sexual Activity  . Alcohol use: Never  . Drug use: Never  . Sexual activity: Not on file  Other Topics Concern  . Not on file  Social History Narrative  . Not on file   Social Determinants of Health   Financial Resource Strain:   . Difficulty of Paying Living Expenses:   Food Insecurity:   . Worried About Charity fundraiser in the Last Year:   . Arboriculturist in the Last Year:   Transportation Needs:   . Film/video editor (Medical):   Marland Kitchen Lack of Transportation (Non-Medical):   Physical Activity:   . Days of Exercise per Week:   . Minutes of Exercise per Session:   Stress:   . Feeling of Stress :   Social Connections:   . Frequency of Communication with Friends and Family:   . Frequency of Social Gatherings with Friends and Family:   . Attends Religious Services:   . Active Member of Clubs or Organizations:   . Attends Archivist Meetings:   Marland Kitchen Marital Status:   Intimate Partner Violence:   . Fear of Current or Ex-Partner:   . Emotionally Abused:   Marland Kitchen Physically Abused:   . Sexually Abused:     Family History  Problem Relation Age of Onset  . Cancer Mother 81       not sure what type  . Prostate cancer Father   .  Rheum arthritis Father   . Healthy Sister   . Healthy Brother   . Healthy Sister   . Healthy Brother   . Healthy Brother   . Heart disease Brother   . Heart disease Brother      Current Outpatient Medications:  .  acetaminophen (TYLENOL) 500 MG tablet, Take 500-1,000 mg by mouth every 8 (eight) hours as needed (for pain.). , Disp: , Rfl:  .  albuterol (VENTOLIN HFA) 108 (90 Base) MCG/ACT inhaler, Inhale 2 puffs into the lungs every 6 (six) hours as needed (wheezing/shortness of breath). , Disp: , Rfl:  .  lidocaine-prilocaine (EMLA) cream, Apply to affected area once, Disp: 30 g, Rfl: 3 .  oxyCODONE (OXY IR/ROXICODONE) 5 MG immediate release tablet, Take 1 tablet (5 mg total) by mouth every 4 (four) hours as needed for severe pain., Disp: 60 tablet, Rfl: 0 .  SYMBICORT 160-4.5 MCG/ACT inhaler, Inhale 2 puffs into the lungs 2 (two) times daily., Disp: 1 Inhaler, Rfl: 2 .  LORazepam (ATIVAN) 0.5 MG tablet, Take 1 tablet (0.5 mg total) by mouth every 6 (six) hours as needed (Nausea or vomiting). (Patient not taking: Reported on 03/30/2020), Disp: 30 tablet,  Rfl: 0 .  OLANZapine (ZYPREXA) 10 MG tablet, Take 1 tablet (10 mg total) by mouth at bedtime. (Patient not taking: Reported on 03/30/2020), Disp: 30 tablet, Rfl: 2 .  ondansetron (ZOFRAN) 8 MG tablet, Take 1 tablet (8 mg total) by mouth 2 (two) times daily as needed. Start on the third day after chemotherapy. (Patient not taking: Reported on 03/30/2020), Disp: 30 tablet, Rfl: 1 .  potassium chloride (KLOR-CON) 20 MEQ packet, Take 20 mEq by mouth daily. (Patient not taking: Reported on 03/30/2020), Disp: 30 packet, Rfl: 1 .  prochlorperazine (COMPAZINE) 10 MG tablet, Take 1 tablet (10 mg total) by mouth every 6 (six) hours as needed (Nausea or vomiting). (Patient not taking: Reported on 03/30/2020), Disp: 30 tablet, Rfl: 1 No current facility-administered medications for this visit.  Facility-Administered Medications Ordered in Other Visits:  .  sodium  chloride flush (NS) 0.9 % injection 10 mL, 10 mL, Intravenous, PRN, Sindy Guadeloupe, MD, 10 mL at 03/09/20 0919  Physical exam:  Vitals:   03/30/20 0931  BP: 114/74  Pulse: 64  Resp: 17  Temp: (!) 96.9 F (36.1 C)  TempSrc: Tympanic  Weight: 170 lb 11.2 oz (77.4 kg)  Height: 5\' 10"  (1.778 m)   Physical Exam Cardiovascular:     Rate and Rhythm: Normal rate and regular rhythm.     Heart sounds: Normal heart sounds.  Pulmonary:     Effort: Pulmonary effort is normal.     Breath sounds: Normal breath sounds.  Abdominal:     General: Bowel sounds are normal.     Palpations: Abdomen is soft.  Lymphadenopathy:     Comments: There is a residual 2 cm hard neck mass/cervical lymph node.  No other areas of palpable adenopathy  Skin:    General: Skin is warm and dry.  Neurological:     Mental Status: He is alert and oriented to person, place, and time.      CMP Latest Ref Rng & Units 03/09/2020  Glucose 70 - 99 mg/dL 94  BUN 8 - 23 mg/dL 13  Creatinine 0.61 - 1.24 mg/dL 0.73  Sodium 135 - 145 mmol/L 141  Potassium 3.5 - 5.1 mmol/L 3.4(L)  Chloride 98 - 111 mmol/L 105  CO2 22 - 32 mmol/L 28  Calcium 8.9 - 10.3 mg/dL 8.9  Total Protein 6.5 - 8.1 g/dL 6.7  Total Bilirubin 0.3 - 1.2 mg/dL 0.4  Alkaline Phos 38 - 126 U/L 59  AST 15 - 41 U/L 16  ALT 0 - 44 U/L 10   CBC Latest Ref Rng & Units 03/30/2020  WBC 4.0 - 10.5 K/uL 6.3  Hemoglobin 13.0 - 17.0 g/dL 11.7(L)  Hematocrit 39 - 52 % 34.6(L)  Platelets 150 - 400 K/uL 226    No images are attached to the encounter.  No results found.   Assessment and plan- Patient is a 69 y.o. 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    He is s/p 6 cycles of carbotaxol and Keytruda and therefore on treatment assessment prior to cycle 3 of palliative Keytruda  Counts okay to proceed with cycle 3 of palliative Keytruda today.  Scans so far have shown excellent  response to treatment. Dr. Janese Banks is holding off on doing any additional radiation at this time and he will continue Keytruda until progression or toxicity.  Keytruda today.  Port labs CBC with differential, CMP in 3 weeks see  Dr. Janese Banks for cycle 4.  Abnormal weight loss: Weight down 6lbs. he is not interested in appetite stimulants.  He is eating better.  Encouraged high-calorie protein rich foods.  Jolie our dietitian has reached out to him last week but unfortunately was unable to connect with him.   Back pain-occasional.Asking for a refill of narcotics- refill appropriate  Hypokalemia-supplement today with 20 mEq IV.  Given ongoing dysphagia will prescribe potassium packets that are dissolvable in water 20 mEq daily x5 days.  We also discussed potassium rich foods.   Visit Diagnosis 1. Primary squamous cell carcinoma of palatine tonsil ([place])      Rulon Abide, AGNP-C [facility] [phone] 03/30/2020 9:47 AM

## 2020-03-30 NOTE — Progress Notes (Signed)
Here for Keytruda today. Feeling better. Starting to get taste back. B/B WNL's. Denies any pain. Energy is slowly improving.

## 2020-04-15 ENCOUNTER — Telehealth: Payer: Self-pay

## 2020-04-15 NOTE — Telephone Encounter (Signed)
Nutrition  Called patient for nutrition follow-up as no return call from message left on 7/29.  No answer.  Left another message with RD call back number.   Chart reviewed.  Noted continued weight loss although improved appetite and taste.  Patient has declined appetite stimulant and feeding tube.  Patient has been educated by this RD on ways to increase calories and protein at multiple past visits.    Anabel Lykins B. [name], [place], [place] Registered Dietitian [phone] (mobile)

## 2020-04-20 ENCOUNTER — Inpatient Hospital Stay: Payer: Medicare Other

## 2020-04-20 ENCOUNTER — Other Ambulatory Visit: Payer: Self-pay

## 2020-04-20 ENCOUNTER — Inpatient Hospital Stay (HOSPITAL_BASED_OUTPATIENT_CLINIC_OR_DEPARTMENT_OTHER): Payer: Medicare Other | Admitting: Oncology

## 2020-04-20 ENCOUNTER — Encounter: Payer: Self-pay | Admitting: Oncology

## 2020-04-20 VITALS — BP 113/72 | HR 64 | Temp 96.0°F | Resp 16 | Wt 176.9 lb

## 2020-04-20 VITALS — BP 117/72 | HR 54 | Resp 16

## 2020-04-20 DIAGNOSIS — C099 Malignant neoplasm of tonsil, unspecified: Secondary | ICD-10-CM | POA: Diagnosis not present

## 2020-04-20 DIAGNOSIS — R634 Abnormal weight loss: Secondary | ICD-10-CM | POA: Diagnosis not present

## 2020-04-20 DIAGNOSIS — Z5112 Encounter for antineoplastic immunotherapy: Secondary | ICD-10-CM

## 2020-04-20 DIAGNOSIS — G893 Neoplasm related pain (acute) (chronic): Secondary | ICD-10-CM | POA: Diagnosis not present

## 2020-04-20 LAB — CBC WITH DIFFERENTIAL/PLATELET
Abs Immature Granulocytes: 0.02 K/uL (ref 0.00–0.07)
Basophils Absolute: 0 K/uL (ref 0.0–0.1)
Basophils Relative: 1 %
Eosinophils Absolute: 0.3 K/uL (ref 0.0–0.5)
Eosinophils Relative: 6 %
HCT: 35.4 % — ABNORMAL LOW (ref 39.0–52.0)
Hemoglobin: 11.7 g/dL — ABNORMAL LOW (ref 13.0–17.0)
Immature Granulocytes: 0 %
Lymphocytes Relative: 27 %
Lymphs Abs: 1.6 K/uL (ref 0.7–4.0)
MCH: 32.3 pg (ref 26.0–34.0)
MCHC: 33.1 g/dL (ref 30.0–36.0)
MCV: 97.8 fL (ref 80.0–100.0)
Monocytes Absolute: 0.6 K/uL (ref 0.1–1.0)
Monocytes Relative: 10 %
Neutro Abs: 3.4 K/uL (ref 1.7–7.7)
Neutrophils Relative %: 56 %
Platelets: 215 K/uL (ref 150–400)
RBC: 3.62 MIL/uL — ABNORMAL LOW (ref 4.22–5.81)
RDW: 13.3 % (ref 11.5–15.5)
WBC: 6 K/uL (ref 4.0–10.5)
nRBC: 0 % (ref 0.0–0.2)

## 2020-04-20 LAB — COMPREHENSIVE METABOLIC PANEL WITH GFR
ALT: 10 U/L (ref 0–44)
AST: 18 U/L (ref 15–41)
Albumin: 4 g/dL (ref 3.5–5.0)
Alkaline Phosphatase: 66 U/L (ref 38–126)
Anion gap: 10 (ref 5–15)
BUN: 13 mg/dL (ref 8–23)
CO2: 24 mmol/L (ref 22–32)
Calcium: 8.9 mg/dL (ref 8.9–10.3)
Chloride: 105 mmol/L (ref 98–111)
Creatinine, Ser: 0.75 mg/dL (ref 0.61–1.24)
GFR calc Af Amer: >60 mL/min
GFR calc non Af Amer: >60 mL/min
Glucose, Bld: 100 mg/dL — ABNORMAL HIGH (ref 70–99)
Potassium: 3.6 mmol/L (ref 3.5–5.1)
Sodium: 139 mmol/L (ref 135–145)
Total Bilirubin: 0.6 mg/dL (ref 0.3–1.2)
Total Protein: 7 g/dL (ref 6.5–8.1)

## 2020-04-20 MED ORDER — HEPARIN SOD (PORK) LOCK FLUSH 100 UNIT/ML IV SOLN
500.0000 [IU] | Freq: Once | INTRAVENOUS | Status: AC
  Administered 2020-04-20: 500 [IU] via INTRAVENOUS
  Filled 2020-04-20: qty 5

## 2020-04-20 MED ORDER — SODIUM CHLORIDE 0.9% FLUSH
10.0000 mL | Freq: Once | INTRAVENOUS | Status: AC
  Administered 2020-04-20: 10 mL via INTRAVENOUS
  Filled 2020-04-20: qty 10

## 2020-04-20 MED ORDER — HEPARIN SOD (PORK) LOCK FLUSH 100 UNIT/ML IV SOLN
INTRAVENOUS | Status: AC
  Filled 2020-04-20: qty 5

## 2020-04-20 MED ORDER — OXYCODONE HCL 5 MG PO TABS: 5.0000 mg | ORAL_TABLET | ORAL | 0 refills | Status: DC | PRN

## 2020-04-20 MED ORDER — SODIUM CHLORIDE 0.9 % IV SOLN
Freq: Once | INTRAVENOUS | Status: AC
  Filled 2020-04-20: qty 250

## 2020-04-20 MED ORDER — SODIUM CHLORIDE 0.9 % IV SOLN
200.0000 mg | Freq: Once | INTRAVENOUS | Status: AC
  Administered 2020-04-20: 200 mg via INTRAVENOUS
  Filled 2020-04-20: qty 8

## 2020-04-20 MED ORDER — PROCHLORPERAZINE EDISYLATE 10 MG/2ML IJ SOLN
10.0000 mg | Freq: Once | INTRAMUSCULAR | Status: AC
  Administered 2020-04-20: 10 mg via INTRAVENOUS
  Filled 2020-04-20: qty 2

## 2020-04-20 NOTE — Progress Notes (Signed)
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 Hypopharynx and larynx as well as nasopharynx was unable to be visualized due to gag reflex. This was biopsied and was consistent with nonkeratinizing invasive squamous cell carcinoma positive for p16.PDL 1 70%  PET CT scan showed large hypermetabolic right palatine tonsil mass which crosses the midline. Ipsilateral cervical nodal metastases. Bilateral pulmonary nodules including a dominant 1 cm hypermetabolic right lung base nodule. Multiplicity favor pulmonary metastases. Subcarinal and equivocal left hilar nodal metastases. Hypermetabolism involving the ascending colon. Question mild diverticulitis versus underlying colonic mass or polyp cannot be excluded  Patient underwent EBUS guided biopsy ofhilarlymph nodes. Biopsy showed squamous cell carcinoma. However HPV/p16 status was uninterpretable.  Patient completed 6 cycles of carbotaxol chemotherapy along with Keytruda and is currently on maintenance Keytruda.  Scans show far for excellent  response to treatment  Interval history-patient reports occasional difficulty swallowing and occasional back pain for which she uses as needed oxycodone.  Reports that his taste sensation is coming back and appetite is improving.  He is drinking 1 Ensure a day  ECOG PS- 1 Pain scale- 0 Opioid associated constipation- no  Review of systems- Review of Systems  Constitutional: Positive for malaise/fatigue. Negative for chills, fever and weight loss.  HENT: Negative for congestion, ear discharge and nosebleeds.   Eyes: Negative for blurred vision.  Respiratory: Negative for cough, hemoptysis, sputum production, shortness of breath and wheezing.   Cardiovascular: Negative for chest pain, palpitations, orthopnea and claudication.  Gastrointestinal: Negative for abdominal pain, blood in stool, constipation, diarrhea, heartburn, melena, nausea and vomiting.  Genitourinary: Negative for dysuria, flank pain, frequency, hematuria and urgency.  Musculoskeletal: Positive for back pain. Negative for joint pain and myalgias.  Skin: Negative for rash.  Neurological: Negative for dizziness, tingling, focal weakness, seizures, weakness and headaches.  Endo/Heme/Allergies: Does not bruise/bleed easily.  Psychiatric/Behavioral: Negative for depression and suicidal ideas. The patient does not have insomnia.       No Known Allergies   Past Medical History:  Diagnosis Date  . Asthma   . Lung cancer ([place])   . Lung nodules   . Tonsil cancer [facility])      Past Surgical History:  Procedure Laterality Date  . CERVICAL SPINE SURGERY    . LUMBAR SPINE SURGERY    . PORTA CATH INSERTION N/A 10/08/2019   Procedure: PORTA CATH INSERTION;  Surgeon: Patrick Cabal, MD;  Location: [place] [facility];  Service: Cardiovascular;  Laterality: N/A;  . VIDEO BRONCHOSCOPY WITH ENDOBRONCHIAL NAVIGATION N/A 10/10/2019   Procedure: VIDEO BRONCHOSCOPY WITH ENDOBRONCHIAL NAVIGATION;  Surgeon: Patrick Pita, MD;   Location: [facility] ORS;  Service: Pulmonary;  Laterality: N/A;  . VIDEO BRONCHOSCOPY WITH ENDOBRONCHIAL ULTRASOUND N/A 10/10/2019   Procedure: VIDEO BRONCHOSCOPY WITH ENDOBRONCHIAL ULTRASOUND;  Surgeon: Patrick Pita, MD;  Location: [facility] ORS;  Service: Pulmonary;  Laterality: N/A;    Social History   Socioeconomic History  . Marital status: Widowed    Spouse name: Not on file  . Number of children: Not on file  . Years of education: Not on file  . Highest education level: Not on file  Occupational History  . Not on file  Tobacco Use  . Smoking status: Never Smoker  . Smokeless tobacco: Never Used  Vaping Use  . Vaping Use: Never used  Substance and Sexual Activity  . Alcohol use: Never  . Drug use: Never  . Sexual activity: Not on file  Other Topics Concern  . Not on file  Social History Narrative  . Not on file   Social Determinants of Health   Financial Resource Strain:   . Difficulty of Paying Living Expenses: Not on file  Food Insecurity:   . Worried About Charity fundraiser in the Last Year: Not on file  . Ran Out of Food in the Last Year: Not on file  Transportation Needs:   . Lack of Transportation (Medical): Not on file  . Lack of Transportation (Non-Medical): Not on file  Physical Activity:   . Days of Exercise per Week: Not on file  . Minutes of Exercise per Session: Not on file  Stress:   . Feeling of Stress : Not on file  Social Connections:   . Frequency of Communication with Friends and Family: Not on file  . Frequency of Social Gatherings with Friends and Family: Not on file  . Attends Religious Services: Not on file  . Active Member of Clubs or Organizations: Not on file  . Attends Archivist Meetings: Not on file  . Marital Status: Not on file  Intimate Partner Violence:   . Fear of Current or Ex-Partner: Not on file  . Emotionally Abused: Not on file  . Physically Abused: Not on file  . Sexually Abused: Not on file    Family  History  Problem Relation Age of Onset  . Cancer Mother 63       not sure what type  . Prostate cancer Father   . Rheum arthritis Father   . Healthy Sister   . Healthy Brother   . Healthy Sister   . Healthy Brother   . Healthy Brother   . Heart disease Brother   . Heart disease Brother      Current Outpatient Medications:  .  acetaminophen (TYLENOL) 500 MG tablet, Take 500-1,000 mg by mouth every 8 (eight) hours as needed (for pain.). , Disp: , Rfl:  .  albuterol (VENTOLIN HFA) 108 (90 Base) MCG/ACT inhaler, Inhale 2 puffs into the lungs every 6 (six) hours as needed (wheezing/shortness of breath). , Disp: , Rfl:  .  SYMBICORT 160-4.5 MCG/ACT inhaler, Inhale 2 puffs into the lungs 2 (two) times daily., Disp: 1 Inhaler, Rfl: 2 .  lidocaine-prilocaine (EMLA) cream, Apply to affected area once (Patient not taking: Reported on 04/20/2020), Disp: 30 g, Rfl: 3 .  LORazepam (ATIVAN) 0.5 MG tablet, Take 1 tablet (0.5 mg total) by mouth every 6 (six) hours as needed (Nausea or vomiting). (Patient not taking: Reported on 03/30/2020), Disp: 30 tablet, Rfl: 0 .  OLANZapine (ZYPREXA) 10 MG tablet, Take 1 tablet (10 mg total) by mouth  at bedtime. (Patient not taking: Reported on 03/30/2020), Disp: 30 tablet, Rfl: 2 .  ondansetron (ZOFRAN) 8 MG tablet, Take 1 tablet (8 mg total) by mouth 2 (two) times daily as needed. Start on the third day after chemotherapy. (Patient not taking: Reported on 03/30/2020), Disp: 30 tablet, Rfl: 1 .  oxyCODONE (OXY IR/ROXICODONE) 5 MG immediate release tablet, Take 1 tablet (5 mg total) by mouth every 4 (four) hours as needed for severe pain. (Patient not taking: Reported on 04/20/2020), Disp: 60 tablet, Rfl: 0 .  potassium chloride (KLOR-CON) 20 MEQ packet, Take 20 mEq by mouth daily. (Patient not taking: Reported on 03/30/2020), Disp: 30 packet, Rfl: 1 .  potassium chloride (KLOR-CON) 20 MEQ packet, Take 20 mEq by mouth daily. (Patient not taking: Reported on 04/20/2020), Disp: 5  packet, Rfl: 0 .  prochlorperazine (COMPAZINE) 10 MG tablet, Take 1 tablet (10 mg total) by mouth every 6 (six) hours as needed (Nausea or vomiting). (Patient not taking: Reported on 03/30/2020), Disp: 30 tablet, Rfl: 1 No current facility-administered medications for this visit.  Facility-Administered Medications Ordered in Other Visits:  .  pembrolizumab (KEYTRUDA) 200 mg in sodium chloride 0.9 % 50 mL chemo infusion, 200 mg, Intravenous, Once, Randa Evens C, MD .  sodium chloride flush (NS) 0.9 % injection 10 mL, 10 mL, Intravenous, PRN, Patrick Guadeloupe, MD, 10 mL at 03/09/20 0919  Physical exam:  Vitals:   04/20/20 0850  BP: 113/72  Pulse: 64  Resp: 16  Temp: (!) 96 F (35.6 C)  TempSrc: Tympanic  SpO2: 98%  Weight: 176 lb 14.4 oz (80.2 kg)   Physical Exam Constitutional:      General: He is not in acute distress. Cardiovascular:     Rate and Rhythm: Normal rate and regular rhythm.     Heart sounds: Normal heart sounds.  Pulmonary:     Effort: Pulmonary effort is normal.     Breath sounds: Normal breath sounds.  Abdominal:     General: Bowel sounds are normal.     Palpations: Abdomen is soft.  Lymphadenopathy:     Comments: No palpable cervical adenopathy  Skin:    General: Skin is warm and dry.  Neurological:     Mental Status: He is alert and oriented to person, place, and time.      CMP Latest Ref Rng & Units 04/20/2020  Glucose 70 - 99 mg/dL 100(H)  BUN 8 - 23 mg/dL 13  Creatinine 0.61 - 1.24 mg/dL 0.75  Sodium 135 - 145 mmol/L 139  Potassium 3.5 - 5.1 mmol/L 3.6  Chloride 98 - 111 mmol/L 105  CO2 22 - 32 mmol/L 24  Calcium 8.9 - 10.3 mg/dL 8.9  Total Protein 6.5 - 8.1 g/dL 7.0  Total Bilirubin 0.3 - 1.2 mg/dL 0.6  Alkaline Phos 38 - 126 U/L 66  AST 15 - 41 U/L 18  ALT 0 - 44 U/L 10   CBC Latest Ref Rng & Units 04/20/2020  WBC 4.0 - 10.5 K/uL 6.0  Hemoglobin 13.0 - 17.0 g/dL 11.7(L)  Hematocrit 39 - 52 % 35.4(L)  Platelets 150 - 400 K/uL 215      Assessment and plan- Patient is a 69 y.o. 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   He is s/p 6 cycles of carbotaxol Keytruda and is here for on treatment assessment prior to cycle 4 of palliative Keytruda  Counts okay to proceed  with cycle 4 of palliative Keytruda today.  I will see him back in 3 weeks for cycle 5.  Clinically and based on recent scans patient has responded to treatment well.  Abnormal weight loss: Weight up to 176 pounds from a prior weight of 170 pounds.  He was 220 pounds back in January.  Reports that he states sensation is coming back and he is working on his dietary intake   Visit Diagnosis 1. Encounter for antineoplastic immunotherapy   2. 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 Abnormal weight loss   4. Neoplasm related pain      Dr. Randa Evens, MD, MPH [facility] [phone] 04/20/2020 9:59 AM

## 2020-05-10 ENCOUNTER — Other Ambulatory Visit: Payer: Self-pay | Admitting: *Deleted

## 2020-05-10 DIAGNOSIS — C099 Malignant neoplasm of tonsil, unspecified: Secondary | ICD-10-CM

## 2020-05-11 ENCOUNTER — Inpatient Hospital Stay: Payer: Medicare Other | Attending: Oncology

## 2020-05-11 ENCOUNTER — Inpatient Hospital Stay (HOSPITAL_BASED_OUTPATIENT_CLINIC_OR_DEPARTMENT_OTHER): Payer: Medicare Other | Admitting: Oncology

## 2020-05-11 ENCOUNTER — Other Ambulatory Visit: Payer: Self-pay

## 2020-05-11 ENCOUNTER — Encounter: Payer: Self-pay | Admitting: Oncology

## 2020-05-11 ENCOUNTER — Other Ambulatory Visit: Payer: Self-pay | Admitting: *Deleted

## 2020-05-11 ENCOUNTER — Inpatient Hospital Stay: Payer: Medicare Other

## 2020-05-11 VITALS — BP 120/77 | HR 68 | Temp 98.6°F | Resp 16 | Ht 70.0 in | Wt 181.8 lb

## 2020-05-11 DIAGNOSIS — Z79899 Other long term (current) drug therapy: Secondary | ICD-10-CM | POA: Insufficient documentation

## 2020-05-11 DIAGNOSIS — Z5112 Encounter for antineoplastic immunotherapy: Secondary | ICD-10-CM | POA: Insufficient documentation

## 2020-05-11 DIAGNOSIS — C7800 Secondary malignant neoplasm of unspecified lung: Secondary | ICD-10-CM | POA: Diagnosis not present

## 2020-05-11 DIAGNOSIS — C099 Malignant neoplasm of tonsil, unspecified: Secondary | ICD-10-CM

## 2020-05-11 DIAGNOSIS — M549 Dorsalgia, unspecified: Secondary | ICD-10-CM | POA: Diagnosis not present

## 2020-05-11 DIAGNOSIS — M545 Other chronic pain: Secondary | ICD-10-CM

## 2020-05-11 DIAGNOSIS — G8929 Other chronic pain: Secondary | ICD-10-CM | POA: Diagnosis not present

## 2020-05-11 DIAGNOSIS — C771 Secondary and unspecified malignant neoplasm of intrathoracic lymph nodes: Secondary | ICD-10-CM | POA: Insufficient documentation

## 2020-05-11 LAB — COMPREHENSIVE METABOLIC PANEL
ALT: 9 U/L (ref 0–44)
AST: 15 U/L (ref 15–41)
Albumin: 3.9 g/dL (ref 3.5–5.0)
Alkaline Phosphatase: 64 U/L (ref 38–126)
Anion gap: 9 (ref 5–15)
BUN: 16 mg/dL (ref 8–23)
CO2: 24 mmol/L (ref 22–32)
Calcium: 8.7 mg/dL — ABNORMAL LOW (ref 8.9–10.3)
Chloride: 106 mmol/L (ref 98–111)
Creatinine, Ser: 0.84 mg/dL (ref 0.61–1.24)
GFR calc Af Amer: >60 mL/min (ref >60)
GFR calc non Af Amer: >60 mL/min (ref >60)
Glucose, Bld: 106 mg/dL — ABNORMAL HIGH (ref 70–99)
Potassium: 4 mmol/L (ref 3.5–5.1)
Sodium: 139 mmol/L (ref 135–145)
Total Bilirubin: 0.6 mg/dL (ref 0.3–1.2)
Total Protein: 6.9 g/dL (ref 6.5–8.1)

## 2020-05-11 LAB — CBC WITH DIFFERENTIAL/PLATELET
Abs Immature Granulocytes: 0.02 10*3/uL (ref 0.00–0.07)
Basophils Absolute: 0 10*3/uL (ref 0.0–0.1)
Basophils Relative: 1 %
Eosinophils Absolute: 0.3 10*3/uL (ref 0.0–0.5)
Eosinophils Relative: 6 %
HCT: 35.4 % — ABNORMAL LOW (ref 39.0–52.0)
Hemoglobin: 12.1 g/dL — ABNORMAL LOW (ref 13.0–17.0)
Immature Granulocytes: 0 %
Lymphocytes Relative: 24 %
Lymphs Abs: 1.3 10*3/uL (ref 0.7–4.0)
MCH: 33.1 pg (ref 26.0–34.0)
MCHC: 34.2 g/dL (ref 30.0–36.0)
MCV: 96.7 fL (ref 80.0–100.0)
Monocytes Absolute: 0.5 10*3/uL (ref 0.1–1.0)
Monocytes Relative: 9 %
Neutro Abs: 3.5 10*3/uL (ref 1.7–7.7)
Neutrophils Relative %: 60 %
Platelets: 207 10*3/uL (ref 150–400)
RBC: 3.66 MIL/uL — ABNORMAL LOW (ref 4.22–5.81)
RDW: 12.8 % (ref 11.5–15.5)
WBC: 5.7 10*3/uL (ref 4.0–10.5)
nRBC: 0 % (ref 0.0–0.2)

## 2020-05-11 MED ORDER — SODIUM CHLORIDE 0.9% FLUSH
10.0000 mL | Freq: Once | INTRAVENOUS | Status: AC
  Administered 2020-05-11: 10 mL via INTRAVENOUS
  Filled 2020-05-11: qty 10

## 2020-05-11 MED ORDER — OXYCODONE HCL 5 MG PO TABS: 5.0000 mg | ORAL_TABLET | ORAL | 0 refills | Status: DC | PRN

## 2020-05-11 MED ORDER — HEPARIN SOD (PORK) LOCK FLUSH 100 UNIT/ML IV SOLN
500.0000 [IU] | Freq: Once | INTRAVENOUS | Status: AC
  Administered 2020-05-11: 500 [IU] via INTRAVENOUS
  Filled 2020-05-11: qty 5

## 2020-05-11 MED ORDER — SODIUM CHLORIDE 0.9 % IV SOLN
200.0000 mg | Freq: Once | INTRAVENOUS | Status: AC
  Administered 2020-05-11: 200 mg via INTRAVENOUS
  Filled 2020-05-11: qty 8

## 2020-05-11 MED ORDER — PROCHLORPERAZINE EDISYLATE 10 MG/2ML IJ SOLN
10.0000 mg | Freq: Once | INTRAMUSCULAR | Status: AC
  Administered 2020-05-11: 10 mg via INTRAVENOUS
  Filled 2020-05-11: qty 2

## 2020-05-11 MED ORDER — SODIUM CHLORIDE 0.9 % IV SOLN
Freq: Once | INTRAVENOUS | Status: AC
  Filled 2020-05-11: qty 250

## 2020-05-11 MED ORDER — HEPARIN SOD (PORK) LOCK FLUSH 100 UNIT/ML IV SOLN
INTRAVENOUS | Status: AC
  Filled 2020-05-11: qty 5

## 2020-05-13 ENCOUNTER — Encounter: Payer: Self-pay | Admitting: Oncology

## 2020-05-13 NOTE — Progress Notes (Signed)
Hematology/Oncology Consult note 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 Richardson Landry for symptoms of sore throat and difficulty swallowing which she has been experiencing over the last 1 year.He underwent a comprehensive auto laryngoscopy exam which showed a firm enlarged right tonsillar mass with necrotic area extending onto the adjacent soft palate into the root of the uvula. Hypopharynx and larynx as well as nasopharynx was unable to be visualized due to gag reflex. This was biopsied and was consistent with nonkeratinizing invasive squamous cell carcinoma positive for p16.PDL 1 70%  PET CT scan showed large hypermetabolic right palatine tonsil mass which crosses the midline. Ipsilateral cervical nodal metastases. Bilateral pulmonary nodules including a dominant 1 cm hypermetabolic right lung base nodule. Multiplicity favor pulmonary metastases. Subcarinal and equivocal left hilar nodal metastases. Hypermetabolism involving the ascending colon. Question mild diverticulitis versus underlying colonic mass or polyp cannot be excluded  Patient underwent EBUS guided biopsy ofhilarlymph nodes. Biopsy showed squamous cell carcinoma. However HPV/p16 status was uninterpretable.  Patient completed 6 cycles of carbotaxol chemotherapy along with Keytruda and is currently on maintenance Keytruda. Scans show far for excellent  response to treatment   Interval history-appetite is coming back and he is continuing to gain weight.  Denies any significant difficulty swallowing.  He has occasional low back pain unrelated to malignancy for which she uses as needed oxycodone  ECOG PS- 1 Pain scale- 3 Opioid associated constipation- no  Review of systems- Review of Systems  Constitutional: Positive for malaise/fatigue. Negative for chills, fever and weight loss.  HENT: Negative for congestion, ear discharge and nosebleeds.   Eyes: Negative for blurred vision.  Respiratory: Negative for cough, hemoptysis, sputum production, shortness of breath and wheezing.   Cardiovascular: Negative for chest pain, palpitations, orthopnea and claudication.  Gastrointestinal: Negative for abdominal pain, blood in stool, constipation, diarrhea, heartburn, melena, nausea and vomiting.  Genitourinary: Negative for dysuria, flank pain, frequency, hematuria and urgency.  Musculoskeletal: Positive for back pain. Negative for joint pain and myalgias.  Skin: Negative for rash.  Neurological: Negative for dizziness, tingling, focal weakness, seizures, weakness and headaches.  Endo/Heme/Allergies: Does not bruise/bleed easily.  Psychiatric/Behavioral: Negative for depression and suicidal ideas. The patient does not have insomnia.        No Known Allergies   Past Medical History:  Diagnosis Date  . Asthma   . Lung cancer ([place])   . Lung nodules   . Tonsil cancer [facility])      Past Surgical History:  Procedure Laterality Date  . CERVICAL SPINE SURGERY    . LUMBAR SPINE SURGERY    . PORTA CATH INSERTION N/A 10/08/2019   Procedure: PORTA CATH INSERTION;  Surgeon: Katha Cabal, MD;  Location: [place] [facility];  Service: Cardiovascular;  Laterality: N/A;  . VIDEO BRONCHOSCOPY WITH ENDOBRONCHIAL NAVIGATION N/A 10/10/2019   Procedure: VIDEO BRONCHOSCOPY WITH ENDOBRONCHIAL NAVIGATION;  Surgeon: Tyler Pita, MD;  Location: [facility]  ORS;  Service:  Pulmonary;  Laterality: N/A;  . VIDEO BRONCHOSCOPY WITH ENDOBRONCHIAL ULTRASOUND N/A 10/10/2019   Procedure: VIDEO BRONCHOSCOPY WITH ENDOBRONCHIAL ULTRASOUND;  Surgeon: Tyler Pita, MD;  Location: [facility] ORS;  Service: Pulmonary;  Laterality: N/A;    Social History   Socioeconomic History  . Marital status: Widowed    Spouse name: Not on file  . Number of children: Not on file  . Years of education: Not on file  . Highest education level: Not on file  Occupational History  . Not on file  Tobacco Use  . Smoking status: Never Smoker  . Smokeless tobacco: Never Used  Vaping Use  . Vaping Use: Never used  Substance and Sexual Activity  . Alcohol use: Never  . Drug use: Never  . Sexual activity: Not on file  Other Topics Concern  . Not on file  Social History Narrative  . Not on file   Social Determinants of Health   Financial Resource Strain:   . Difficulty of Paying Living Expenses: Not on file  Food Insecurity:   . Worried About Charity fundraiser in the Last Year: Not on file  . Ran Out of Food in the Last Year: Not on file  Transportation Needs:   . Lack of Transportation (Medical): Not on file  . Lack of Transportation (Non-Medical): Not on file  Physical Activity:   . Days of Exercise per Week: Not on file  . Minutes of Exercise per Session: Not on file  Stress:   . Feeling of Stress : Not on file  Social Connections:   . Frequency of Communication with Friends and Family: Not on file  . Frequency of Social Gatherings with Friends and Family: Not on file  . Attends Religious Services: Not on file  . Active Member of Clubs or Organizations: Not on file  . Attends Archivist Meetings: Not on file  . Marital Status: Not on file  Intimate Partner Violence:   . Fear of Current or Ex-Partner: Not on file  . Emotionally Abused: Not on file  . Physically Abused: Not on file  . Sexually Abused: Not on file    Family History  Problem  Relation Age of Onset  . Cancer Mother 61       not sure what type  . Prostate cancer Father   . Rheum arthritis Father   . Healthy Sister   . Healthy Brother   . Healthy Sister   . Healthy Brother   . Healthy Brother   . Heart disease Brother   . Heart disease Brother      Current Outpatient Medications:  .  acetaminophen (TYLENOL) 500 MG tablet, Take 500-1,000 mg by mouth every 8 (eight) hours as needed (for pain.). , Disp: , Rfl:  .  albuterol (VENTOLIN HFA) 108 (90 Base) MCG/ACT inhaler, Inhale 2 puffs into the lungs every 6 (six) hours as needed (wheezing/shortness of breath). , Disp: , Rfl:  .  SYMBICORT 160-4.5 MCG/ACT inhaler, Inhale 2 puffs into the lungs 2 (two) times daily., Disp: 1 Inhaler, Rfl: 2 .  lidocaine-prilocaine (EMLA) cream, Apply to affected area once (Patient not taking: Reported on 04/20/2020), Disp: 30 g, Rfl: 3 .  LORazepam (ATIVAN) 0.5 MG tablet, Take 1 tablet (0.5 mg total) by mouth every 6 (six) hours as needed (Nausea or vomiting). (Patient not taking: Reported on 03/30/2020), Disp: 30 tablet, Rfl: 0 .  OLANZapine (ZYPREXA) 10 MG tablet, Take 1 tablet (10 mg total) by mouth at  bedtime. (Patient not taking: Reported on 03/30/2020), Disp: 30 tablet, Rfl: 2 .  ondansetron (ZOFRAN) 8 MG tablet, Take 1 tablet (8 mg total) by mouth 2 (two) times daily as needed. Start on the third day after chemotherapy. (Patient not taking: Reported on 03/30/2020), Disp: 30 tablet, Rfl: 1 .  oxyCODONE (OXY IR/ROXICODONE) 5 MG immediate release tablet, Take 1 tablet (5 mg total) by mouth every 4 (four) hours as needed for severe pain., Disp: 60 tablet, Rfl: 0 .  potassium chloride (KLOR-CON) 20 MEQ packet, Take 20 mEq by mouth daily. (Patient not taking: Reported on 03/30/2020), Disp: 30 packet, Rfl: 1 .  prochlorperazine (COMPAZINE) 10 MG tablet, Take 1 tablet (10 mg total) by mouth every 6 (six) hours as needed (Nausea or vomiting). (Patient not taking: Reported on 03/30/2020), Disp: 30  tablet, Rfl: 1 No current facility-administered medications for this visit.  Facility-Administered Medications Ordered in Other Visits:  .  sodium chloride flush (NS) 0.9 % injection 10 mL, 10 mL, Intravenous, PRN, Sindy Guadeloupe, MD, 10 mL at 03/09/20 0919  Physical exam:  Vitals:   05/11/20 0930  BP: 120/77  Pulse: 68  Resp: 16  Temp: 98.6 F (37 C)  TempSrc: Oral  Weight: 181 lb 12.8 oz (82.5 kg)  Height: 5\' 10"  (1.778 m)   Physical Exam Constitutional:      General: He is not in acute distress. Cardiovascular:     Rate and Rhythm: Normal rate and regular rhythm.     Heart sounds: Normal heart sounds.  Pulmonary:     Effort: Pulmonary effort is normal.     Breath sounds: Normal breath sounds.  Abdominal:     General: Bowel sounds are normal.     Palpations: Abdomen is soft.  Lymphadenopathy:     Comments: No palpable cervical adenopathy  Skin:    General: Skin is warm and dry.  Neurological:     Mental Status: He is alert and oriented to person, place, and time.      CMP Latest Ref Rng & Units 05/11/2020  Glucose 70 - 99 mg/dL 106(H)  BUN 8 - 23 mg/dL 16  Creatinine 0.61 - 1.24 mg/dL 0.84  Sodium 135 - 145 mmol/L 139  Potassium 3.5 - 5.1 mmol/L 4.0  Chloride 98 - 111 mmol/L 106  CO2 22 - 32 mmol/L 24  Calcium 8.9 - 10.3 mg/dL 8.7(L)  Total Protein 6.5 - 8.1 g/dL 6.9  Total Bilirubin 0.3 - 1.2 mg/dL 0.6  Alkaline Phos 38 - 126 U/L 64  AST 15 - 41 U/L 15  ALT 0 - 44 U/L 9   CBC Latest Ref Rng & Units 05/11/2020  WBC 4.0 - 10.5 K/uL 5.7  Hemoglobin 13.0 - 17.0 g/dL 12.1(L)  Hematocrit 39 - 52 % 35.4(L)  Platelets 150 - 400 K/uL 207      Assessment and plan- Patient is a 69 y.o. 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 He is s/p 6 cycles of carbotaxol Keytruda and here for on treatment assessment prior to cycle 5 of palliative Keytruda  Counts okay to proceed with cycle 5  of palliative Keytruda today.  I will see him in 3 weeks with CBC with differential, CMP for cycle 6.  TSH to be checked again in November 2021.  Repeat imaging due In October 2021 as well.  Nonmalignant chronic back pain.  I have renewed his oxycodone to maintain his  quality of life in the setting of stage IV disease even if it is not malignancy related  Overall patient has gained 5 pounds as compared to his last visit which is encouraging.    Visit Diagnosis 1. Encounter for antineoplastic immunotherapy   2. 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 Chronic midline low back pain without sciatica      Dr. Randa Evens, MD, MPH [facility] [phone] 05/13/2020 4:16 PM

## 2020-06-01 ENCOUNTER — Encounter: Payer: Self-pay | Admitting: Oncology

## 2020-06-01 ENCOUNTER — Inpatient Hospital Stay: Payer: Medicare Other | Attending: Oncology

## 2020-06-01 ENCOUNTER — Other Ambulatory Visit: Payer: Self-pay

## 2020-06-01 ENCOUNTER — Inpatient Hospital Stay (HOSPITAL_BASED_OUTPATIENT_CLINIC_OR_DEPARTMENT_OTHER): Payer: Medicare Other | Admitting: Oncology

## 2020-06-01 ENCOUNTER — Inpatient Hospital Stay: Payer: Medicare Other

## 2020-06-01 VITALS — BP 124/77 | HR 65 | Temp 96.7°F | Resp 16 | Wt 184.8 lb

## 2020-06-01 DIAGNOSIS — C771 Secondary and unspecified malignant neoplasm of intrathoracic lymph nodes: Secondary | ICD-10-CM | POA: Insufficient documentation

## 2020-06-01 DIAGNOSIS — Z79899 Other long term (current) drug therapy: Secondary | ICD-10-CM | POA: Insufficient documentation

## 2020-06-01 DIAGNOSIS — C099 Malignant neoplasm of tonsil, unspecified: Secondary | ICD-10-CM

## 2020-06-01 DIAGNOSIS — Z5112 Encounter for antineoplastic immunotherapy: Secondary | ICD-10-CM

## 2020-06-01 DIAGNOSIS — C7800 Secondary malignant neoplasm of unspecified lung: Secondary | ICD-10-CM | POA: Diagnosis not present

## 2020-06-01 LAB — CBC WITH DIFFERENTIAL/PLATELET
Abs Immature Granulocytes: 0.01 10*3/uL (ref 0.00–0.07)
Basophils Absolute: 0 10*3/uL (ref 0.0–0.1)
Basophils Relative: 1 %
Eosinophils Absolute: 0.4 10*3/uL (ref 0.0–0.5)
Eosinophils Relative: 7 %
HCT: 35.2 % — ABNORMAL LOW (ref 39.0–52.0)
Hemoglobin: 12.1 g/dL — ABNORMAL LOW (ref 13.0–17.0)
Immature Granulocytes: 0 %
Lymphocytes Relative: 27 %
Lymphs Abs: 1.4 10*3/uL (ref 0.7–4.0)
MCH: 32.9 pg (ref 26.0–34.0)
MCHC: 34.4 g/dL (ref 30.0–36.0)
MCV: 95.7 fL (ref 80.0–100.0)
Monocytes Absolute: 0.5 10*3/uL (ref 0.1–1.0)
Monocytes Relative: 10 %
Neutro Abs: 2.9 10*3/uL (ref 1.7–7.7)
Neutrophils Relative %: 55 %
Platelets: 218 10*3/uL (ref 150–400)
RBC: 3.68 MIL/uL — ABNORMAL LOW (ref 4.22–5.81)
RDW: 12.7 % (ref 11.5–15.5)
WBC: 5.2 10*3/uL (ref 4.0–10.5)
nRBC: 0 % (ref 0.0–0.2)

## 2020-06-01 LAB — COMPREHENSIVE METABOLIC PANEL
ALT: 11 U/L (ref 0–44)
AST: 16 U/L (ref 15–41)
Albumin: 3.8 g/dL (ref 3.5–5.0)
Alkaline Phosphatase: 68 U/L (ref 38–126)
Anion gap: 9 (ref 5–15)
BUN: 14 mg/dL (ref 8–23)
CO2: 24 mmol/L (ref 22–32)
Calcium: 8.7 mg/dL — ABNORMAL LOW (ref 8.9–10.3)
Chloride: 106 mmol/L (ref 98–111)
Creatinine, Ser: 0.76 mg/dL (ref 0.61–1.24)
GFR calc non Af Amer: >60 mL/min (ref >60)
Glucose, Bld: 102 mg/dL — ABNORMAL HIGH (ref 70–99)
Potassium: 3.9 mmol/L (ref 3.5–5.1)
Sodium: 139 mmol/L (ref 135–145)
Total Bilirubin: 0.7 mg/dL (ref 0.3–1.2)
Total Protein: 7 g/dL (ref 6.5–8.1)

## 2020-06-01 MED ORDER — PROCHLORPERAZINE EDISYLATE 10 MG/2ML IJ SOLN
10.0000 mg | Freq: Once | INTRAMUSCULAR | Status: AC
  Administered 2020-06-01: 10 mg via INTRAVENOUS
  Filled 2020-06-01: qty 2

## 2020-06-01 MED ORDER — SODIUM CHLORIDE 0.9 % IV SOLN
Freq: Once | INTRAVENOUS | Status: AC
  Filled 2020-06-01: qty 250

## 2020-06-01 MED ORDER — HEPARIN SOD (PORK) LOCK FLUSH 100 UNIT/ML IV SOLN
INTRAVENOUS | Status: AC
  Filled 2020-06-01: qty 5

## 2020-06-01 MED ORDER — SODIUM CHLORIDE 0.9 % IV SOLN
200.0000 mg | Freq: Once | INTRAVENOUS | Status: AC
  Administered 2020-06-01: 200 mg via INTRAVENOUS
  Filled 2020-06-01: qty 8

## 2020-06-01 MED ORDER — HEPARIN SOD (PORK) LOCK FLUSH 100 UNIT/ML IV SOLN
500.0000 [IU] | Freq: Once | INTRAVENOUS | Status: AC | PRN
  Administered 2020-06-01: 500 [IU]
  Filled 2020-06-01: qty 5

## 2020-06-01 NOTE — Progress Notes (Signed)
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 Richardson Landry for symptoms of sore throat and difficulty swallowing which she has been experiencing over the last 1 year.He underwent a comprehensive auto laryngoscopy exam which showed a firm enlarged right tonsillar mass with necrotic area extending onto the adjacent soft palate into the root of the uvula. Hypopharynx and larynx as well as nasopharynx was unable to be visualized due to gag reflex. This was biopsied and was consistent with nonkeratinizing invasive squamous cell carcinoma positive for p16.PDL 1 70%  PET CT scan showed large hypermetabolic right palatine tonsil mass which crosses the midline. Ipsilateral cervical nodal metastases. Bilateral pulmonary nodules including a dominant 1 cm hypermetabolic right lung base nodule. Multiplicity favor pulmonary metastases. Subcarinal and equivocal left hilar nodal metastases. Hypermetabolism involving the ascending colon. Question mild diverticulitis versus underlying colonic mass or polyp cannot be excluded  Patient underwent EBUS guided biopsy ofhilarlymph nodes. Biopsy showed squamous cell carcinoma. However HPV/p16 status was uninterpretable.  Patient completed 6 cycles of carbotaxol chemotherapy along with Keytruda and is currently on maintenance Keytruda. Scans show far for excellent response  to treatment    Interval history-doing well and denies any complaints presently.  Has mild pain during swallowing.  He is gaining his weight back and is up to 184 pounds today from 176 pounds 6 weeks ago  ECOG PS- 1 Pain scale- 0   Review of systems- Review of Systems  Constitutional: Negative for chills, fever, malaise/fatigue and weight loss.  HENT: Negative for congestion, ear discharge and nosebleeds.   Eyes: Negative for blurred vision.  Respiratory: Negative for cough, hemoptysis, sputum production, shortness of breath and wheezing.   Cardiovascular: Negative for chest pain, palpitations, orthopnea and claudication.  Gastrointestinal: Negative for abdominal pain, blood in stool, constipation, diarrhea, heartburn, melena, nausea and vomiting.  Genitourinary: Negative for dysuria, flank pain, frequency, hematuria and urgency.  Musculoskeletal: Negative for back pain, joint pain and myalgias.  Skin: Negative for rash.  Neurological: Negative for dizziness, tingling, focal weakness, seizures, weakness and headaches.  Endo/Heme/Allergies: Does not bruise/bleed easily.  Psychiatric/Behavioral: Negative for depression and suicidal ideas. The patient does not have insomnia.       No Known Allergies   Past Medical History:  Diagnosis Date  . Asthma   . Lung cancer ([place])   . Lung nodules   . Tonsil cancer [facility])      Past Surgical History:  Procedure Laterality Date  . CERVICAL SPINE SURGERY    . LUMBAR SPINE SURGERY    . PORTA CATH INSERTION N/A 10/08/2019   Procedure: PORTA CATH INSERTION;  Surgeon: Katha Cabal, MD;  Location: [place] [facility];  Service: Cardiovascular;  Laterality: N/A;  . VIDEO BRONCHOSCOPY WITH ENDOBRONCHIAL NAVIGATION N/A 10/10/2019   Procedure: VIDEO BRONCHOSCOPY WITH ENDOBRONCHIAL NAVIGATION;  Surgeon: Tyler Pita, MD;  Location: [facility] ORS;  Service: Pulmonary;  Laterality: N/A;  . VIDEO BRONCHOSCOPY WITH ENDOBRONCHIAL ULTRASOUND  N/A  10/10/2019   Procedure: VIDEO BRONCHOSCOPY WITH ENDOBRONCHIAL ULTRASOUND;  Surgeon: Tyler Pita, MD;  Location: [facility] ORS;  Service: Pulmonary;  Laterality: N/A;    Social History   Socioeconomic History  . Marital status: Widowed    Spouse name: Not on file  . Number of children: Not on file  . Years of education: Not on file  . Highest education level: Not on file  Occupational History  . Not on file  Tobacco Use  . Smoking status: Never Smoker  . Smokeless tobacco: Never Used  Vaping Use  . Vaping Use: Never used  Substance and Sexual Activity  . Alcohol use: Never  . Drug use: Never  . Sexual activity: Not on file  Other Topics Concern  . Not on file  Social History Narrative  . Not on file   Social Determinants of Health   Financial Resource Strain:   . Difficulty of Paying Living Expenses: Not on file  Food Insecurity:   . Worried About Charity fundraiser in the Last Year: Not on file  . Ran Out of Food in the Last Year: Not on file  Transportation Needs:   . Lack of Transportation (Medical): Not on file  . Lack of Transportation (Non-Medical): Not on file  Physical Activity:   . Days of Exercise per Week: Not on file  . Minutes of Exercise per Session: Not on file  Stress:   . Feeling of Stress : Not on file  Social Connections:   . Frequency of Communication with Friends and Family: Not on file  . Frequency of Social Gatherings with Friends and Family: Not on file  . Attends Religious Services: Not on file  . Active Member of Clubs or Organizations: Not on file  . Attends Archivist Meetings: Not on file  . Marital Status: Not on file  Intimate Partner Violence:   . Fear of Current or Ex-Partner: Not on file  . Emotionally Abused: Not on file  . Physically Abused: Not on file  . Sexually Abused: Not on file    Family History  Problem Relation Age of Onset  . Cancer Mother 47       not sure what type  . Prostate cancer Father   .  Rheum arthritis Father   . Healthy Sister   . Healthy Brother   . Healthy Sister   . Healthy Brother   . Healthy Brother   . Heart disease Brother   . Heart disease Brother      Current Outpatient Medications:  .  acetaminophen (TYLENOL) 500 MG tablet, Take 500-1,000 mg by mouth every 8 (eight) hours as needed (for pain.). , Disp: , Rfl:  .  albuterol (VENTOLIN HFA) 108 (90 Base) MCG/ACT inhaler, Inhale 2 puffs into the lungs every 6 (six) hours as needed (wheezing/shortness of breath). , Disp: , Rfl:  .  SYMBICORT 160-4.5 MCG/ACT inhaler, Inhale 2 puffs into the lungs 2 (two) times daily., Disp: 1 Inhaler, Rfl: 2 .  lidocaine-prilocaine (EMLA) cream, Apply to affected area once (Patient not taking: Reported on 04/20/2020), Disp: 30 g, Rfl: 3 .  LORazepam (ATIVAN) 0.5 MG tablet, Take 1 tablet (0.5 mg total) by mouth every 6 (six) hours as needed (Nausea or vomiting). (Patient not taking: Reported on 03/30/2020), Disp: 30 tablet, Rfl: 0 .  OLANZapine (ZYPREXA) 10 MG tablet, Take 1 tablet (10 mg total) by mouth at bedtime. (Patient not taking: Reported on 03/30/2020), Disp: 30 tablet,  Rfl: 2 .  ondansetron (ZOFRAN) 8 MG tablet, Take 1 tablet (8 mg total) by mouth 2 (two) times daily as needed. Start on the third day after chemotherapy. (Patient not taking: Reported on 03/30/2020), Disp: 30 tablet, Rfl: 1 .  oxyCODONE (OXY IR/ROXICODONE) 5 MG immediate release tablet, Take 1 tablet (5 mg total) by mouth every 4 (four) hours as needed for severe pain. (Patient not taking: Reported on 06/01/2020), Disp: 60 tablet, Rfl: 0 .  potassium chloride (KLOR-CON) 20 MEQ packet, Take 20 mEq by mouth daily. (Patient not taking: Reported on 03/30/2020), Disp: 30 packet, Rfl: 1 .  prochlorperazine (COMPAZINE) 10 MG tablet, Take 1 tablet (10 mg total) by mouth every 6 (six) hours as needed (Nausea or vomiting). (Patient not taking: Reported on 03/30/2020), Disp: 30 tablet, Rfl: 1 No current facility-administered medications  for this visit.  Facility-Administered Medications Ordered in Other Visits:  .  sodium chloride flush (NS) 0.9 % injection 10 mL, 10 mL, Intravenous, PRN, Sindy Guadeloupe, MD, 10 mL at 03/09/20 0919  Physical exam:  Vitals:   06/01/20 0906  BP: 124/77  Pulse: 65  Resp: 16  Temp: (!) 96.7 F (35.9 C)  TempSrc: Tympanic  SpO2: 99%  Weight: 184 lb 12.8 oz (83.8 kg)   Physical Exam Cardiovascular:     Rate and Rhythm: Normal rate and regular rhythm.     Heart sounds: Normal heart sounds.  Pulmonary:     Effort: Pulmonary effort is normal.     Breath sounds: Normal breath sounds.  Abdominal:     General: Bowel sounds are normal.     Palpations: Abdomen is soft.  Skin:    General: Skin is warm and dry.  Neurological:     Mental Status: He is alert and oriented to person, place, and time.      CMP Latest Ref Rng & Units 06/01/2020  Glucose 70 - 99 mg/dL 102(H)  BUN 8 - 23 mg/dL 14  Creatinine 0.61 - 1.24 mg/dL 0.76  Sodium 135 - 145 mmol/L 139  Potassium 3.5 - 5.1 mmol/L 3.9  Chloride 98 - 111 mmol/L 106  CO2 22 - 32 mmol/L 24  Calcium 8.9 - 10.3 mg/dL 8.7(L)  Total Protein 6.5 - 8.1 g/dL 7.0  Total Bilirubin 0.3 - 1.2 mg/dL 0.7  Alkaline Phos 38 - 126 U/L 68  AST 15 - 41 U/L 16  ALT 0 - 44 U/L 11   CBC Latest Ref Rng & Units 06/01/2020  WBC 4.0 - 10.5 K/uL 5.2  Hemoglobin 13.0 - 17.0 g/dL 12.1(L)  Hematocrit 39 - 52 % 35.2(L)  Platelets 150 - 400 K/uL 218     Assessment and plan- Patient is a 69 y.o. 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 He is s/p 6 cycles of carbotaxol Keytruda.  He is here for on treatment assessment prior to cycle 6 of palliative Keytruda  Counseling to proceed with cycle 6 of Keytruda today.  I will see him back in 3 weeks with CBC with differential CMP and TSH for cycle 7.  Clinically patient has done very well with resolution of neck mass, improvement in  swallowing and excellent response on scans.  He is gaining his weight back.  I will plan to repeat scans in November 2021.   Visit Diagnosis 1. Encounter for antineoplastic immunotherapy   2. 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 Randa Evens, MD, MPH [facility] [phone] 06/01/2020 3:15 PM

## 2020-06-22 ENCOUNTER — Encounter: Payer: Self-pay | Admitting: Oncology

## 2020-06-22 ENCOUNTER — Inpatient Hospital Stay: Payer: Medicare Other

## 2020-06-22 ENCOUNTER — Other Ambulatory Visit: Payer: Self-pay

## 2020-06-22 ENCOUNTER — Inpatient Hospital Stay (HOSPITAL_BASED_OUTPATIENT_CLINIC_OR_DEPARTMENT_OTHER): Payer: Medicare Other | Admitting: Oncology

## 2020-06-22 VITALS — BP 112/77 | HR 76 | Temp 97.7°F | Resp 16 | Wt 186.6 lb

## 2020-06-22 DIAGNOSIS — Z5112 Encounter for antineoplastic immunotherapy: Secondary | ICD-10-CM | POA: Diagnosis not present

## 2020-06-22 DIAGNOSIS — C099 Malignant neoplasm of tonsil, unspecified: Secondary | ICD-10-CM

## 2020-06-22 LAB — CBC WITH DIFFERENTIAL/PLATELET
Abs Immature Granulocytes: 0.02 10*3/uL (ref 0.00–0.07)
Basophils Absolute: 0 10*3/uL (ref 0.0–0.1)
Basophils Relative: 1 %
Eosinophils Absolute: 0.3 10*3/uL (ref 0.0–0.5)
Eosinophils Relative: 4 %
HCT: 36.6 % — ABNORMAL LOW (ref 39.0–52.0)
Hemoglobin: 12.5 g/dL — ABNORMAL LOW (ref 13.0–17.0)
Immature Granulocytes: 0 %
Lymphocytes Relative: 22 %
Lymphs Abs: 1.5 10*3/uL (ref 0.7–4.0)
MCH: 32.6 pg (ref 26.0–34.0)
MCHC: 34.2 g/dL (ref 30.0–36.0)
MCV: 95.6 fL (ref 80.0–100.0)
Monocytes Absolute: 0.6 10*3/uL (ref 0.1–1.0)
Monocytes Relative: 9 %
Neutro Abs: 4.5 10*3/uL (ref 1.7–7.7)
Neutrophils Relative %: 64 %
Platelets: 223 10*3/uL (ref 150–400)
RBC: 3.83 MIL/uL — ABNORMAL LOW (ref 4.22–5.81)
RDW: 12.6 % (ref 11.5–15.5)
WBC: 6.9 10*3/uL (ref 4.0–10.5)
nRBC: 0 % (ref 0.0–0.2)

## 2020-06-22 LAB — COMPREHENSIVE METABOLIC PANEL
ALT: 12 U/L (ref 0–44)
AST: 17 U/L (ref 15–41)
Albumin: 3.9 g/dL (ref 3.5–5.0)
Alkaline Phosphatase: 72 U/L (ref 38–126)
Anion gap: 8 (ref 5–15)
BUN: 17 mg/dL (ref 8–23)
CO2: 25 mmol/L (ref 22–32)
Calcium: 8.9 mg/dL (ref 8.9–10.3)
Chloride: 107 mmol/L (ref 98–111)
Creatinine, Ser: 0.86 mg/dL (ref 0.61–1.24)
GFR, Estimated: >60 mL/min (ref >60)
Glucose, Bld: 103 mg/dL — ABNORMAL HIGH (ref 70–99)
Potassium: 3.7 mmol/L (ref 3.5–5.1)
Sodium: 140 mmol/L (ref 135–145)
Total Bilirubin: 0.7 mg/dL (ref 0.3–1.2)
Total Protein: 7 g/dL (ref 6.5–8.1)

## 2020-06-22 MED ORDER — SODIUM CHLORIDE 0.9 % IV SOLN
200.0000 mg | Freq: Once | INTRAVENOUS | Status: AC
  Administered 2020-06-22: 200 mg via INTRAVENOUS
  Filled 2020-06-22: qty 8

## 2020-06-22 MED ORDER — PROCHLORPERAZINE EDISYLATE 10 MG/2ML IJ SOLN
10.0000 mg | Freq: Once | INTRAMUSCULAR | Status: AC
  Administered 2020-06-22: 10 mg via INTRAVENOUS
  Filled 2020-06-22: qty 2

## 2020-06-22 MED ORDER — HEPARIN SOD (PORK) LOCK FLUSH 100 UNIT/ML IV SOLN
500.0000 [IU] | Freq: Once | INTRAVENOUS | Status: DC | PRN
  Filled 2020-06-22: qty 5

## 2020-06-22 MED ORDER — HEPARIN SOD (PORK) LOCK FLUSH 100 UNIT/ML IV SOLN
500.0000 [IU] | Freq: Once | INTRAVENOUS | Status: AC
  Administered 2020-06-22: 500 [IU] via INTRAVENOUS
  Filled 2020-06-22: qty 5

## 2020-06-22 MED ORDER — SODIUM CHLORIDE 0.9% FLUSH
10.0000 mL | Freq: Once | INTRAVENOUS | Status: AC
  Administered 2020-06-22: 10 mL via INTRAVENOUS
  Filled 2020-06-22: qty 10

## 2020-06-22 MED ORDER — SODIUM CHLORIDE 0.9 % IV SOLN
Freq: Once | INTRAVENOUS | Status: AC
  Filled 2020-06-22: qty 250

## 2020-06-22 NOTE — Progress Notes (Signed)
Pt stable at discharge.  

## 2020-06-25 NOTE — Progress Notes (Signed)
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 Richardson Landry for symptoms of sore throat and difficulty swallowing which she has been experiencing over the last 1 year.He underwent a comprehensive auto laryngoscopy exam which showed a firm enlarged right tonsillar mass with necrotic area extending onto the adjacent soft palate into the root of the uvula. Hypopharynx and larynx as well as nasopharynx was unable to be visualized due to gag reflex. This was biopsied and was consistent with nonkeratinizing invasive squamous cell carcinoma positive for p16.PDL 1 70%  PET CT scan showed large hypermetabolic right palatine tonsil mass which crosses the midline. Ipsilateral cervical nodal metastases. Bilateral pulmonary nodules including a dominant 1 cm hypermetabolic right lung base nodule. Multiplicity favor pulmonary metastases. Subcarinal and equivocal left hilar nodal metastases. Hypermetabolism involving the ascending colon. Question mild diverticulitis versus underlying colonic mass or polyp cannot be excluded  Patient underwent EBUS guided biopsy ofhilarlymph nodes. Biopsy showed squamous cell carcinoma. However HPV/p16 status was uninterpretable.  Patient completed 6 cycles of carbotaxol chemotherapy along with Keytruda and is currently on maintenance Keytruda. Scans show far for excellent response  to treatment  Interval history- reports occasional pain during swallowing but is otherwise doing well.  Appetite has improved and weight is also improving.  Denies any cough shortness of breath or diarrhea.  ECOG PS- 1 Pain scale- 0 Opioid associated constipation- no  Review of systems- Review of Systems  Constitutional: Negative for chills, fever, malaise/fatigue and weight loss.  HENT: Negative for congestion, ear discharge and nosebleeds.   Eyes: Negative for blurred vision.  Respiratory: Negative for cough, hemoptysis, sputum production, shortness of breath and wheezing.   Cardiovascular: Negative for chest pain, palpitations, orthopnea and claudication.  Gastrointestinal: Negative for abdominal pain, blood in stool, constipation, diarrhea, heartburn, melena, nausea and vomiting.  Genitourinary: Negative for dysuria, flank pain, frequency, hematuria and urgency.  Musculoskeletal: Negative for back pain, joint pain and myalgias.  Skin: Negative for rash.  Neurological: Negative for dizziness, tingling, focal weakness, seizures, weakness and headaches.  Endo/Heme/Allergies: Does not bruise/bleed easily.  Psychiatric/Behavioral: Negative for depression and suicidal ideas. The patient does not have insomnia.       No Known Allergies   Past Medical History:  Diagnosis Date  . Asthma   . Lung cancer ([place])   . Lung nodules   . Tonsil cancer [facility])      Past Surgical History:  Procedure Laterality Date  . CERVICAL SPINE SURGERY    . LUMBAR SPINE SURGERY    . PORTA CATH INSERTION N/A 10/08/2019   Procedure: PORTA CATH INSERTION;  Surgeon: Katha Cabal, MD;  Location: [facility];  Service: Cardiovascular;  Laterality: N/A;  . VIDEO BRONCHOSCOPY WITH ENDOBRONCHIAL NAVIGATION N/A 10/10/2019   Procedure: VIDEO BRONCHOSCOPY WITH ENDOBRONCHIAL NAVIGATION;  Surgeon: Tyler Pita, MD;  Location: [facility];  Service: Pulmonary;  Laterality: N/A;  . VIDEO BRONCHOSCOPY  WITH ENDOBRONCHIAL ULTRASOUND N/A 10/10/2019  Procedure: VIDEO BRONCHOSCOPY WITH ENDOBRONCHIAL ULTRASOUND;  Surgeon: Tyler Pita, MD;  Location: [facility];  Service: Pulmonary;  Laterality: N/A;    Social History   Socioeconomic History  . Marital status: Widowed    Spouse name: Not on file  . Number of children: Not on file  . Years of education: Not on file  . Highest education level: Not on file  Occupational History  . Not on file  Tobacco Use  . Smoking status: Never Smoker  . Smokeless tobacco: Never Used  Vaping Use  . Vaping Use: Never used  Substance and Sexual Activity  . Alcohol use: Never  . Drug use: Never  . Sexual activity: Not on file  Other Topics Concern  . Not on file  Social History Narrative  . Not on file   Social Determinants of Health   Financial Resource Strain:   . Difficulty of Paying Living Expenses: Not on file  Food Insecurity:   . Worried About Charity fundraiser in the Last Year: Not on file  . Ran Out of Food in the Last Year: Not on file  Transportation Needs:   . Lack of Transportation (Medical): Not on file  . Lack of Transportation (Non-Medical): Not on file  Physical Activity:   . Days of Exercise per Week: Not on file  . Minutes of Exercise per Session: Not on file  Stress:   . Feeling of Stress : Not on file  Social Connections:   . Frequency of Communication with Friends and Family: Not on file  . Frequency of Social Gatherings with Friends and Family: Not on file  . Attends Religious Services: Not on file  . Active Member of Clubs or Organizations: Not on file  . Attends Archivist Meetings: Not on file  . Marital Status: Not on file  Intimate Partner Violence:   . Fear of Current or Ex-Partner: Not on file  . Emotionally Abused: Not on file  . Physically Abused: Not on file  . Sexually Abused: Not on file    Family History  Problem Relation Age of Onset  . Cancer Mother 79       not sure what type    . Prostate cancer Father   . Rheum arthritis Father   . Healthy Sister   . Healthy Brother   . Healthy Sister   . Healthy Brother   . Healthy Brother   . Heart disease Brother   . Heart disease Brother      Current Outpatient Medications:  .  acetaminophen (TYLENOL) 500 MG tablet, Take 500-1,000 mg by mouth every 8 (eight) hours as needed (for pain.). , Disp: , Rfl:  .  albuterol (VENTOLIN HFA) 108 (90 Base) MCG/ACT inhaler, Inhale 2 puffs into the lungs every 6 (six) hours as needed (wheezing/shortness of breath). , Disp: , Rfl:  .  lidocaine-prilocaine (EMLA) cream, Apply to affected area once (Patient not taking: Reported on 04/20/2020), Disp: 30 g, Rfl: 3 .  LORazepam (ATIVAN) 0.5 MG tablet, Take 1 tablet (0.5 mg total) by mouth every 6 (six) hours as needed (Nausea or vomiting). (Patient not taking: Reported on 03/30/2020), Disp: 30 tablet, Rfl: 0 .  OLANZapine (ZYPREXA) 10 MG tablet, Take 1 tablet (10 mg total) by mouth at bedtime. (Patient not taking: Reported on 03/30/2020), Disp: 30 tablet, Rfl: 2 .  ondansetron (ZOFRAN) 8 MG tablet, Take 1 tablet (8 mg total) by mouth 2 (two) times daily as needed. Start on  the third day after chemotherapy. (Patient not taking: Reported on 03/30/2020), Disp: 30 tablet, Rfl: 1 .  oxyCODONE (OXY IR/ROXICODONE) 5 MG immediate release tablet, Take 1 tablet (5 mg total) by mouth every 4 (four) hours as needed for severe pain. (Patient not taking: Reported on 06/01/2020), Disp: 60 tablet, Rfl: 0 .  potassium chloride (KLOR-CON) 20 MEQ packet, Take 20 mEq by mouth daily. (Patient not taking: Reported on 03/30/2020), Disp: 30 packet, Rfl: 1 .  prochlorperazine (COMPAZINE) 10 MG tablet, Take 1 tablet (10 mg total) by mouth every 6 (six) hours as needed (Nausea or vomiting). (Patient not taking: Reported on 03/30/2020), Disp: 30 tablet, Rfl: 1 .  SYMBICORT 160-4.5 MCG/ACT inhaler, Inhale 2 puffs into the lungs 2 (two) times daily., Disp: 1 Inhaler, Rfl: 2 No current  facility-administered medications for this visit.  Facility-Administered Medications Ordered in Other Visits:  .  sodium chloride flush (NS) 0.9 % injection 10 mL, 10 mL, Intravenous, PRN, Sindy Guadeloupe, MD, 10 mL at 03/09/20 0919  Physical exam:  Vitals:   06/22/20 1329  BP: 112/77  Pulse: 76  Resp: 16  Temp: 97.7 F (36.5 C)  TempSrc: Tympanic  SpO2: 98%  Weight: 186 lb 9.6 oz (84.6 kg)   Physical Exam Constitutional:      General: He is not in acute distress. Cardiovascular:     Rate and Rhythm: Normal rate and regular rhythm.     Heart sounds: Normal heart sounds.  Pulmonary:     Effort: Pulmonary effort is normal.     Breath sounds: Normal breath sounds.  Abdominal:     General: Bowel sounds are normal.     Palpations: Abdomen is soft.  Lymphadenopathy:     Comments: No palpable cervical lymphadenopathy  Skin:    General: Skin is warm and dry.  Neurological:     Mental Status: He is alert and oriented to person, place, and time.      CMP Latest Ref Rng & Units 06/22/2020  Glucose 70 - 99 mg/dL 103(H)  BUN 8 - 23 mg/dL 17  Creatinine 0.61 - 1.24 mg/dL 0.86  Sodium 135 - 145 mmol/L 140  Potassium 3.5 - 5.1 mmol/L 3.7  Chloride 98 - 111 mmol/L 107  CO2 22 - 32 mmol/L 25  Calcium 8.9 - 10.3 mg/dL 8.9  Total Protein 6.5 - 8.1 g/dL 7.0  Total Bilirubin 0.3 - 1.2 mg/dL 0.7  Alkaline Phos 38 - 126 U/L 72  AST 15 - 41 U/L 17  ALT 0 - 44 U/L 12   CBC Latest Ref Rng & Units 06/22/2020  WBC 4.0 - 10.5 K/uL 6.9  Hemoglobin 13.0 - 17.0 g/dL 12.5(L)  Hematocrit 39 - 52 % 36.6(L)  Platelets 150 - 400 K/uL 223    Assessment and plan- Patient is a 69 y.o. male withsquamous cell carcinoma of the right tonsil likely stage IVB cT3N 1M1 with lung and hilar lymph node metastases.He is s/p 6 cycles of carbotaxol Keytruda.  He is here for on treatment assessment prior to cycle 7 of palliative Keytruda  Counseled to proceed with cycle 7 of palliative Keytruda today.  I  will see him back in 3 weeks with CBC with differential, CMP and TSH for Cycle 8.  He will need a CT chest abdomen and pelvis with contrast as well as CT soft tissue neck prior.   Visit Diagnosis 1. 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 Encounter for antineoplastic immunotherapy  Dr. Randa Evens, MD, MPH [facility] at [facility] [phone] 06/25/2020 1:02 PM

## 2020-07-02 ENCOUNTER — Ambulatory Visit
Admission: RE | Admit: 2020-07-02 | Discharge: 2020-07-02 | Disposition: A | Discharge status: Home / Self Care | Payer: Medicare Other | Source: Ambulatory Visit | Attending: Oncology | Admitting: Oncology

## 2020-07-02 ENCOUNTER — Ambulatory Visit: Payer: Medicare Other

## 2020-07-02 ENCOUNTER — Other Ambulatory Visit: Payer: Self-pay

## 2020-07-02 DIAGNOSIS — K4090 Unilateral inguinal hernia, without obstruction or gangrene, not specified as recurrent: Secondary | ICD-10-CM | POA: Insufficient documentation

## 2020-07-02 DIAGNOSIS — C099 Malignant neoplasm of tonsil, unspecified: Secondary | ICD-10-CM | POA: Diagnosis not present

## 2020-07-02 MED ORDER — IOHEXOL 300 MG/ML  SOLN
100.0000 mL | Freq: Once | INTRAMUSCULAR | Status: AC | PRN
  Administered 2020-07-02: 100 mL via INTRAVENOUS

## 2020-07-13 ENCOUNTER — Inpatient Hospital Stay (HOSPITAL_BASED_OUTPATIENT_CLINIC_OR_DEPARTMENT_OTHER): Payer: Medicare Other | Admitting: Oncology

## 2020-07-13 ENCOUNTER — Inpatient Hospital Stay: Payer: Medicare Other

## 2020-07-13 ENCOUNTER — Inpatient Hospital Stay: Payer: Medicare Other | Attending: Oncology

## 2020-07-13 VITALS — BP 110/71 | HR 63 | Temp 98.3°F | Resp 16 | Wt 187.8 lb

## 2020-07-13 DIAGNOSIS — Z79899 Other long term (current) drug therapy: Secondary | ICD-10-CM | POA: Diagnosis not present

## 2020-07-13 DIAGNOSIS — C7800 Secondary malignant neoplasm of unspecified lung: Secondary | ICD-10-CM | POA: Insufficient documentation

## 2020-07-13 DIAGNOSIS — C099 Malignant neoplasm of tonsil, unspecified: Secondary | ICD-10-CM | POA: Insufficient documentation

## 2020-07-13 DIAGNOSIS — G893 Neoplasm related pain (acute) (chronic): Secondary | ICD-10-CM | POA: Diagnosis not present

## 2020-07-13 DIAGNOSIS — Z5112 Encounter for antineoplastic immunotherapy: Secondary | ICD-10-CM | POA: Diagnosis not present

## 2020-07-13 DIAGNOSIS — C771 Secondary and unspecified malignant neoplasm of intrathoracic lymph nodes: Secondary | ICD-10-CM | POA: Insufficient documentation

## 2020-07-13 DIAGNOSIS — Z9221 Personal history of antineoplastic chemotherapy: Secondary | ICD-10-CM | POA: Insufficient documentation

## 2020-07-13 LAB — CBC WITH DIFFERENTIAL/PLATELET
Abs Immature Granulocytes: 0.02 10*3/uL (ref 0.00–0.07)
Basophils Absolute: 0 10*3/uL (ref 0.0–0.1)
Basophils Relative: 0 %
Eosinophils Absolute: 0.3 10*3/uL (ref 0.0–0.5)
Eosinophils Relative: 5 %
HCT: 38.2 % — ABNORMAL LOW (ref 39.0–52.0)
Hemoglobin: 13.3 g/dL (ref 13.0–17.0)
Immature Granulocytes: 0 %
Lymphocytes Relative: 20 %
Lymphs Abs: 1.4 10*3/uL (ref 0.7–4.0)
MCH: 33.1 pg (ref 26.0–34.0)
MCHC: 34.8 g/dL (ref 30.0–36.0)
MCV: 95 fL (ref 80.0–100.0)
Monocytes Absolute: 0.6 10*3/uL (ref 0.1–1.0)
Monocytes Relative: 8 %
Neutro Abs: 4.6 10*3/uL (ref 1.7–7.7)
Neutrophils Relative %: 67 %
Platelets: 219 10*3/uL (ref 150–400)
RBC: 4.02 MIL/uL — ABNORMAL LOW (ref 4.22–5.81)
RDW: 12.6 % (ref 11.5–15.5)
WBC: 6.9 10*3/uL (ref 4.0–10.5)
nRBC: 0 % (ref 0.0–0.2)

## 2020-07-13 LAB — COMPREHENSIVE METABOLIC PANEL
ALT: 10 U/L (ref 0–44)
AST: 17 U/L (ref 15–41)
Albumin: 4.2 g/dL (ref 3.5–5.0)
Alkaline Phosphatase: 67 U/L (ref 38–126)
Anion gap: 8 (ref 5–15)
BUN: 18 mg/dL (ref 8–23)
CO2: 24 mmol/L (ref 22–32)
Calcium: 9 mg/dL (ref 8.9–10.3)
Chloride: 106 mmol/L (ref 98–111)
Creatinine, Ser: 0.91 mg/dL (ref 0.61–1.24)
GFR, Estimated: >60 mL/min (ref >60)
Glucose, Bld: 109 mg/dL — ABNORMAL HIGH (ref 70–99)
Potassium: 4.1 mmol/L (ref 3.5–5.1)
Sodium: 138 mmol/L (ref 135–145)
Total Bilirubin: 0.7 mg/dL (ref 0.3–1.2)
Total Protein: 7.3 g/dL (ref 6.5–8.1)

## 2020-07-13 LAB — TSH: TSH: 9.617 u[IU]/mL — ABNORMAL HIGH (ref 0.350–4.500)

## 2020-07-13 MED ORDER — SODIUM CHLORIDE 0.9 % IV SOLN
200.0000 mg | Freq: Once | INTRAVENOUS | Status: AC
  Administered 2020-07-13: 200 mg via INTRAVENOUS
  Filled 2020-07-13: qty 8

## 2020-07-13 MED ORDER — SODIUM CHLORIDE 0.9% FLUSH
10.0000 mL | Freq: Once | INTRAVENOUS | Status: AC
  Administered 2020-07-13: 10 mL via INTRAVENOUS
  Filled 2020-07-13: qty 10

## 2020-07-13 MED ORDER — HEPARIN SOD (PORK) LOCK FLUSH 100 UNIT/ML IV SOLN
INTRAVENOUS | Status: AC
  Filled 2020-07-13: qty 5

## 2020-07-13 MED ORDER — HEPARIN SOD (PORK) LOCK FLUSH 100 UNIT/ML IV SOLN
500.0000 [IU] | Freq: Once | INTRAVENOUS | Status: AC
  Administered 2020-07-13: 500 [IU] via INTRAVENOUS
  Filled 2020-07-13: qty 5

## 2020-07-13 MED ORDER — SODIUM CHLORIDE 0.9 % IV SOLN
Freq: Once | INTRAVENOUS | Status: AC
  Filled 2020-07-13: qty 250

## 2020-07-13 MED ORDER — PROCHLORPERAZINE EDISYLATE 10 MG/2ML IJ SOLN
10.0000 mg | Freq: Once | INTRAMUSCULAR | Status: AC
  Administered 2020-07-13: 10 mg via INTRAVENOUS
  Filled 2020-07-13: qty 2

## 2020-07-13 NOTE — Progress Notes (Signed)
Pt received [place] tx today. Tolerated well.

## 2020-07-14 ENCOUNTER — Other Ambulatory Visit: Payer: Self-pay

## 2020-07-14 DIAGNOSIS — Z5112 Encounter for antineoplastic immunotherapy: Secondary | ICD-10-CM

## 2020-07-14 DIAGNOSIS — C099 Malignant neoplasm of tonsil, unspecified: Secondary | ICD-10-CM

## 2020-07-15 ENCOUNTER — Encounter: Payer: Self-pay | Admitting: Oncology

## 2020-07-15 NOTE — Progress Notes (Signed)
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 Richardson Landry for symptoms of sore throat and difficulty swallowing which she has been experiencing over the last 1 year.He underwent a comprehensive auto laryngoscopy exam which showed a firm enlarged right tonsillar mass with necrotic area extending onto the adjacent soft palate into the root of the uvula. Hypopharynx and larynx as well as nasopharynx was unable to be visualized due to gag reflex. This was biopsied and was consistent with nonkeratinizing invasive squamous cell carcinoma positive for p16.PDL 1 70%  PET CT scan showed large hypermetabolic right palatine tonsil mass which crosses the midline. Ipsilateral cervical nodal metastases. Bilateral pulmonary nodules including a dominant 1 cm hypermetabolic right lung base nodule. Multiplicity favor pulmonary metastases. Subcarinal and equivocal left hilar nodal metastases. Hypermetabolism involving the ascending colon. Question mild diverticulitis versus underlying colonic mass or polyp cannot be excluded  Patient underwent EBUS guided biopsy ofhilarlymph nodes. Biopsy showed squamous cell carcinoma. However HPV/p16 status was uninterpretable.  Patient completed 6 cycles of carbotaxol chemotherapy along with Keytruda and is currently on maintenance Keytruda. Scans show far for excellent  response to treatment  Interval history-patient reports occasional pain during swallowing and chronic low back pain.  Denies any new complaints appetite and weight have remained stable  ECOG PS- 1 Pain scale- 2 Opioid associated constipation- no  Review of systems- Review of Systems  Constitutional: Negative for chills, fever, malaise/fatigue and weight loss.  HENT: Negative for congestion, ear discharge and nosebleeds.   Eyes: Negative for blurred vision.  Respiratory: Negative for cough, hemoptysis, sputum production, shortness of breath and wheezing.   Cardiovascular: Negative for chest pain, palpitations, orthopnea and claudication.  Gastrointestinal: Negative for abdominal pain, blood in stool, constipation, diarrhea, heartburn, melena, nausea and vomiting.  Genitourinary: Negative for dysuria, flank pain, frequency, hematuria and urgency.  Musculoskeletal: Negative for back pain, joint pain and myalgias.  Skin: Negative for rash.  Neurological: Negative for dizziness, tingling, focal weakness, seizures, weakness and headaches.  Endo/Heme/Allergies: Does not bruise/bleed easily.  Psychiatric/Behavioral: Negative for depression and suicidal ideas. The patient does not have insomnia.        No Known Allergies   Past Medical History:  Diagnosis Date  . Asthma   . Lung cancer ([place])   . Lung nodules   . Tonsil cancer [facility])      Past Surgical History:  Procedure Laterality Date  . CERVICAL SPINE SURGERY    . LUMBAR SPINE SURGERY    . PORTA CATH INSERTION N/A 10/08/2019   Procedure: PORTA CATH INSERTION;  Surgeon: Katha Cabal, MD;  Location: [place] [facility];  Service: Cardiovascular;  Laterality: N/A;  . VIDEO BRONCHOSCOPY WITH ENDOBRONCHIAL NAVIGATION N/A 10/10/2019   Procedure: VIDEO BRONCHOSCOPY WITH ENDOBRONCHIAL NAVIGATION;  Surgeon: Tyler Pita, MD;  Location: [facility];  Service: Pulmonary;  Laterality: N/A;  . VIDEO BRONCHOSCOPY WITH ENDOBRONCHIAL  ULTRASOUND N/A 10/10/2019   Procedure: VIDEO BRONCHOSCOPY  WITH ENDOBRONCHIAL ULTRASOUND;  Surgeon: Tyler Pita, MD;  Location: [facility];  Service: Pulmonary;  Laterality: N/A;    Social History   Socioeconomic History  . Marital status: Widowed    Spouse name: Not on file  . Number of children: Not on file  . Years of education: Not on file  . Highest education level: Not on file  Occupational History  . Not on file  Tobacco Use  . Smoking status: Never Smoker  . Smokeless tobacco: Never Used  Vaping Use  . Vaping Use: Never used  Substance and Sexual Activity  . Alcohol use: Never  . Drug use: Never  . Sexual activity: Not on file  Other Topics Concern  . Not on file  Social History Narrative  . Not on file   Social Determinants of Health   Financial Resource Strain:   . Difficulty of Paying Living Expenses: Not on file  Food Insecurity:   . Worried About Charity fundraiser in the Last Year: Not on file  . Ran Out of Food in the Last Year: Not on file  Transportation Needs:   . Lack of Transportation (Medical): Not on file  . Lack of Transportation (Non-Medical): Not on file  Physical Activity:   . Days of Exercise per Week: Not on file  . Minutes of Exercise per Session: Not on file  Stress:   . Feeling of Stress : Not on file  Social Connections:   . Frequency of Communication with Friends and Family: Not on file  . Frequency of Social Gatherings with Friends and Family: Not on file  . Attends Religious Services: Not on file  . Active Member of Clubs or Organizations: Not on file  . Attends Archivist Meetings: Not on file  . Marital Status: Not on file  Intimate Partner Violence:   . Fear of Current or Ex-Partner: Not on file  . Emotionally Abused: Not on file  . Physically Abused: Not on file  . Sexually Abused: Not on file    Family History  Problem Relation Age of Onset  . Cancer Mother 62       not sure what type  . Prostate cancer  Father   . Rheum arthritis Father   . Healthy Sister   . Healthy Brother   . Healthy Sister   . Healthy Brother   . Healthy Brother   . Heart disease Brother   . Heart disease Brother      Current Outpatient Medications:  .  acetaminophen (TYLENOL) 500 MG tablet, Take 500-1,000 mg by mouth every 8 (eight) hours as needed (for pain.). , Disp: , Rfl:  .  albuterol (VENTOLIN HFA) 108 (90 Base) MCG/ACT inhaler, Inhale 2 puffs into the lungs every 6 (six) hours as needed (wheezing/shortness of breath). , Disp: , Rfl:  .  oxyCODONE (OXY IR/ROXICODONE) 5 MG immediate release tablet, Take 1 tablet (5 mg total) by mouth every 4 (four) hours as needed for severe pain., Disp: 60 tablet, Rfl: 0 .  SYMBICORT 160-4.5 MCG/ACT inhaler, Inhale 2 puffs into the lungs 2 (two) times daily., Disp: 1 Inhaler, Rfl: 2 .  lidocaine-prilocaine (EMLA) cream, Apply to affected area once (Patient not taking: Reported on 04/20/2020), Disp: 30 g, Rfl: 3 .  LORazepam (ATIVAN) 0.5 MG tablet, Take 1 tablet (0.5 mg total) by mouth every 6 (six) hours as needed (Nausea or vomiting). (Patient not taking: Reported on 03/30/2020), Disp: 30 tablet, Rfl: 0 .  OLANZapine (ZYPREXA) 10 MG tablet, Take 1 tablet (10 mg total) by mouth at bedtime. (Patient not taking: Reported on 03/30/2020), Disp: 30 tablet, Rfl: 2 .  ondansetron (ZOFRAN) 8 MG tablet, Take 1 tablet (8 mg total) by mouth 2 (two) times daily as needed. Start on the third day after chemotherapy. (Patient not taking: Reported on 03/30/2020), Disp: 30 tablet, Rfl: 1 .  potassium chloride (KLOR-CON) 20 MEQ packet, Take 20 mEq by mouth daily. (Patient not taking: Reported on 03/30/2020), Disp: 30 packet, Rfl: 1 .  prochlorperazine (COMPAZINE) 10 MG tablet, Take 1 tablet (10 mg total) by mouth every 6 (six) hours as needed (Nausea or vomiting). (Patient not taking: Reported on 03/30/2020), Disp: 30 tablet, Rfl: 1 No current facility-administered medications for this  visit.  Facility-Administered Medications Ordered in Other Visits:  .  sodium chloride flush (NS) 0.9 % injection 10 mL, 10 mL, Intravenous, PRN, Sindy Guadeloupe, MD, 10 mL at 03/09/20 0919  Physical exam:  Vitals:   07/13/20 1334  BP: 110/71  Pulse: 63  Resp: 16  Temp: 98.3 F (36.8 C)  TempSrc: Tympanic  SpO2: 98%  Weight: 187 lb 12.8 oz (85.2 kg)   Physical Exam Constitutional:      General: He is not in acute distress. Cardiovascular:     Rate and Rhythm: Normal rate and regular rhythm.     Heart sounds: Normal heart sounds.  Pulmonary:     Effort: Pulmonary effort is normal.     Breath sounds: Normal breath sounds.  Abdominal:     General: Bowel sounds are normal.     Palpations: Abdomen is soft.  Lymphadenopathy:     Comments: No palpable cervical adenopathy   Skin:    General: Skin is warm and dry.  Neurological:     Mental Status: He is alert and oriented to person, place, and time.      CMP Latest Ref Rng & Units 07/13/2020  Glucose 70 - 99 mg/dL 109(H)  BUN 8 - 23 mg/dL 18  Creatinine 0.61 - 1.24 mg/dL 0.91  Sodium 135 - 145 mmol/L 138  Potassium 3.5 - 5.1 mmol/L 4.1  Chloride 98 - 111 mmol/L 106  CO2 22 - 32 mmol/L 24  Calcium 8.9 - 10.3 mg/dL 9.0  Total Protein 6.5 - 8.1 g/dL 7.3  Total Bilirubin 0.3 - 1.2 mg/dL 0.7  Alkaline Phos 38 - 126 U/L 67  AST 15 - 41 U/L 17  ALT 0 - 44 U/L 10   CBC Latest Ref Rng & Units 07/13/2020  WBC 4.0 - 10.5 K/uL 6.9  Hemoglobin 13.0 - 17.0 g/dL 13.3  Hematocrit 39 - 52 % 38.2(L)  Platelets 150 - 400 K/uL 219    No images are attached to the encounter.  CT SOFT TISSUE NECK W CONTRAST  Result Date: 07/02/2020 CLINICAL DATA:  Primary squamous cell carcinoma of the palatine tonsil. EXAM: CT NECK WITH CONTRAST TECHNIQUE: Multidetector CT imaging of the neck was performed using the standard protocol following the bolus administration of intravenous contrast. CONTRAST:  146mL OMNIPAQUE IOHEXOL 300 MG/ML  SOLN  COMPARISON:  CT of the neck February 10, 2020. FINDINGS: Pharynx and larynx: Medial ization of the right vocal cord. No evidence of recurrent tumor in the right palatine fossa. Stable appearance of left palatine tonsil with inflammatory coarse calcifications. Salivary glands: No inflammation, mass, or stone. Thyroid: Normal. Lymph nodes: Interval increase in size of level 2B lymph node, now measuring 1.5 cm in short axis (5  mm on prior); level 2A with necrotic center, measuring 1.3 cm (1.1 on prior); level 5A measuring 8 mm with necrotic center and 6 mm (6 mm and 4 mm on prior, respectively). Vascular: Right internal jugular Port-A-Cath again seen. Major neck vessels are patent. Limited intracranial: Negative. Visualized orbits: Negative. Mastoids and visualized paranasal sinuses: Mucosal thickening throughout the paranasal sinuses, more pronounced in the bilateral ethmoid cells and left maxillary sinus. Mastoids are clear. Skeleton: Degenerative changes of the cervical spine. Impacted left maxillary canine. Periapical lucency at the left maxillary central incisor. Dental decay. Upper chest: Negative. IMPRESSION: 1. Interval increase in size of right level 2A, 2B and level 5A lymph nodes, at least 2 of them with necrotic center, concerning for recurrent metastatic disease. 2. No image evidence of recurrent disease within the right palatine fossa. 3. Paranasal sinus disease. Electronically Signed   By: Pedro Earls M.D.   On: 07/02/2020 17:48   CT CHEST ABDOMEN PELVIS W CONTRAST  Result Date: 07/02/2020 CLINICAL DATA:  Squamous cell carcinoma of the palatine tonsil. Diagnosed January 2021 with lung metastasis. Immunotherapy ongoing. EXAM: CT CHEST, ABDOMEN, AND PELVIS WITH CONTRAST TECHNIQUE: Multidetector CT imaging of the chest, abdomen and pelvis was performed following the standard protocol during bolus administration of intravenous contrast. CONTRAST:  162mL OMNIPAQUE IOHEXOL 300 MG/ML  SOLN  COMPARISON:  CT 02/10/2020 FINDINGS: CT CHEST FINDINGS Cardiovascular: No significant vascular findings. Normal heart size. No pericardial effusion. Mediastinum/Nodes: Port in the anterior chest wall with tip in distal SVC. No axillary or supraclavicular adenopathy. No mediastinal or hilar adenopathy. No pericardial fluid. Esophagus normal. Lungs/Pleura: No suspicious pulmonary nodules Musculoskeletal: No aggressive osseous lesion. CT ABDOMEN AND PELVIS FINDINGS Hepatobiliary: No focal hepatic lesion. No biliary ductal dilatation. Gallbladder is normal. Common bile duct is normal. Pancreas: Stomach, small bowel, appendix, and cecum are normal. Multiple diverticula of the descending colon and sigmoid colon without acute inflammation. Spleen: Normal spleen Adrenals/urinary tract: Adrenal glands and kidneys are normal. The ureters and bladder normal. Stomach/Bowel: Stomach, small bowel, appendix, and cecum are normal. The colon and rectosigmoid colon are normal. Vascular/Lymphatic: Abdominal aorta is normal caliber. There is no retroperitoneal or periportal lymphadenopathy. No pelvic lymphadenopathy. Reproductive: Prostate unremarkable Other: No peritoneal metastasis. LEFT inguinal hernia contains a corner of the bladder (image 121/4) Musculoskeletal: No aggressive osseous lesion. IMPRESSION: Chest Impression: 1. No evidence of thoracic metastasis. 2. No mediastinal adenopathy or pulmonary nodularity. Abdomen / Pelvis Impression: 1. No evidence of metastatic disease in the abdomen pelvis. 2. Incidental finding of LEFT inguinal hernia which contains a portion of the bladder. Electronically Signed   By: Suzy Bouchard M.D.   On: 07/02/2020 15:55     Assessment and plan- Patient is a 69 y.o. male withsquamous cell carcinoma of the right tonsil likely stage IVB cT3N 1M1 with lung and hilar lymph node metastases.He is s/p 6 cycles of carbotaxol Keytruda.  He is here for on treatment assessment prior to cycle 8 of  palliative Keytruda and here to discuss CT scan results  I have reviewed CT chest abdomen and pelvis as well as CT soft tissue neck images independently and discussed findings with the patient.Patient had bulky hilar adenopathy and lung nodules on presentation which was biopsy-proven metastatic disease.  Presently his CT chest reveals no mediastinal or hilar adenopathy.  No suspicious lung nodules as well.  He did have involvement of the right level 2 cervical lymph nodes which on recent CT scan appear larger.  However we  are not seeing any new lymph nodes elsewhere.  This could be indicative of pseudoprogression on immunotherapy.  Given the excellent systemic response that he has had so far I plan to continue using Keytruda at this time followed by repeat CT soft tissue neck as well as CT chest abdomen pelvis in about 2 months time.  If there is a continued growth of the cervical lymph nodes I will consider doing a PET scan followed by biopsy and consideration for palliative radiation.  Patient verbalized understanding of the plan  Patient reports occasional pain during swallowing as well as mild low back pain for which she is on as needed oxycodone I will renew his prescription for the same  I will see him back in 3 weeks time for cycle 9 of palliative Keytruda   Visit Diagnosis 1. Encounter for antineoplastic immunotherapy   2. 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 Neoplasm related pain      Dr. Randa Evens, MD, MPH [facility] at [facility] [phone] 07/15/2020 1:48 PM

## 2020-08-03 ENCOUNTER — Inpatient Hospital Stay (HOSPITAL_BASED_OUTPATIENT_CLINIC_OR_DEPARTMENT_OTHER): Payer: Medicare Other | Admitting: Oncology

## 2020-08-03 ENCOUNTER — Inpatient Hospital Stay: Payer: Medicare Other

## 2020-08-03 ENCOUNTER — Encounter: Payer: Self-pay | Admitting: Oncology

## 2020-08-03 ENCOUNTER — Other Ambulatory Visit: Payer: Self-pay

## 2020-08-03 ENCOUNTER — Inpatient Hospital Stay: Payer: Medicare Other | Attending: Oncology

## 2020-08-03 VITALS — BP 119/75 | HR 70 | Temp 97.6°F | Resp 18 | Wt 191.1 lb

## 2020-08-03 DIAGNOSIS — Z5112 Encounter for antineoplastic immunotherapy: Secondary | ICD-10-CM

## 2020-08-03 DIAGNOSIS — C099 Malignant neoplasm of tonsil, unspecified: Secondary | ICD-10-CM | POA: Insufficient documentation

## 2020-08-03 DIAGNOSIS — Z79899 Other long term (current) drug therapy: Secondary | ICD-10-CM | POA: Insufficient documentation

## 2020-08-03 DIAGNOSIS — G893 Neoplasm related pain (acute) (chronic): Secondary | ICD-10-CM | POA: Insufficient documentation

## 2020-08-03 DIAGNOSIS — C771 Secondary and unspecified malignant neoplasm of intrathoracic lymph nodes: Secondary | ICD-10-CM | POA: Diagnosis not present

## 2020-08-03 DIAGNOSIS — R918 Other nonspecific abnormal finding of lung field: Secondary | ICD-10-CM | POA: Diagnosis not present

## 2020-08-03 DIAGNOSIS — R1310 Dysphagia, unspecified: Secondary | ICD-10-CM | POA: Insufficient documentation

## 2020-08-03 DIAGNOSIS — Z9221 Personal history of antineoplastic chemotherapy: Secondary | ICD-10-CM | POA: Diagnosis not present

## 2020-08-03 LAB — COMPREHENSIVE METABOLIC PANEL
ALT: 11 U/L (ref 0–44)
AST: 18 U/L (ref 15–41)
Albumin: 4.1 g/dL (ref 3.5–5.0)
Alkaline Phosphatase: 72 U/L (ref 38–126)
Anion gap: 11 (ref 5–15)
BUN: 15 mg/dL (ref 8–23)
CO2: 25 mmol/L (ref 22–32)
Calcium: 9.2 mg/dL (ref 8.9–10.3)
Chloride: 103 mmol/L (ref 98–111)
Creatinine, Ser: 0.77 mg/dL (ref 0.61–1.24)
GFR, Estimated: >60 mL/min (ref >60)
Glucose, Bld: 108 mg/dL — ABNORMAL HIGH (ref 70–99)
Potassium: 3.7 mmol/L (ref 3.5–5.1)
Sodium: 139 mmol/L (ref 135–145)
Total Bilirubin: 0.6 mg/dL (ref 0.3–1.2)
Total Protein: 7.3 g/dL (ref 6.5–8.1)

## 2020-08-03 LAB — CBC WITH DIFFERENTIAL/PLATELET
Abs Immature Granulocytes: 0.01 10*3/uL (ref 0.00–0.07)
Basophils Absolute: 0 10*3/uL (ref 0.0–0.1)
Basophils Relative: 1 %
Eosinophils Absolute: 0.3 10*3/uL (ref 0.0–0.5)
Eosinophils Relative: 5 %
HCT: 38.2 % — ABNORMAL LOW (ref 39.0–52.0)
Hemoglobin: 13.2 g/dL (ref 13.0–17.0)
Immature Granulocytes: 0 %
Lymphocytes Relative: 23 %
Lymphs Abs: 1.2 10*3/uL (ref 0.7–4.0)
MCH: 33.2 pg (ref 26.0–34.0)
MCHC: 34.6 g/dL (ref 30.0–36.0)
MCV: 96 fL (ref 80.0–100.0)
Monocytes Absolute: 0.6 10*3/uL (ref 0.1–1.0)
Monocytes Relative: 10 %
Neutro Abs: 3.3 10*3/uL (ref 1.7–7.7)
Neutrophils Relative %: 61 %
Platelets: 243 10*3/uL (ref 150–400)
RBC: 3.98 MIL/uL — ABNORMAL LOW (ref 4.22–5.81)
RDW: 12.5 % (ref 11.5–15.5)
WBC: 5.4 10*3/uL (ref 4.0–10.5)
nRBC: 0 % (ref 0.0–0.2)

## 2020-08-03 LAB — TSH: TSH: 3.694 u[IU]/mL (ref 0.350–4.500)

## 2020-08-03 MED ORDER — SODIUM CHLORIDE 0.9% FLUSH
10.0000 mL | Freq: Once | INTRAVENOUS | Status: AC
  Administered 2020-08-03: 10 mL via INTRAVENOUS
  Filled 2020-08-03: qty 10

## 2020-08-03 MED ORDER — HEPARIN SOD (PORK) LOCK FLUSH 100 UNIT/ML IV SOLN
500.0000 [IU] | Freq: Once | INTRAVENOUS | Status: AC
  Administered 2020-08-03: 500 [IU] via INTRAVENOUS
  Filled 2020-08-03: qty 5

## 2020-08-03 MED ORDER — SODIUM CHLORIDE 0.9 % IV SOLN
Freq: Once | INTRAVENOUS | Status: AC
  Filled 2020-08-03: qty 250

## 2020-08-03 MED ORDER — PROCHLORPERAZINE EDISYLATE 10 MG/2ML IJ SOLN
10.0000 mg | Freq: Once | INTRAMUSCULAR | Status: AC
  Administered 2020-08-03: 10 mg via INTRAVENOUS
  Filled 2020-08-03: qty 2

## 2020-08-03 MED ORDER — HEPARIN SOD (PORK) LOCK FLUSH 100 UNIT/ML IV SOLN
INTRAVENOUS | Status: AC
  Filled 2020-08-03: qty 5

## 2020-08-03 MED ORDER — SODIUM CHLORIDE 0.9 % IV SOLN
200.0000 mg | Freq: Once | INTRAVENOUS | Status: AC
  Administered 2020-08-03: 200 mg via INTRAVENOUS
  Filled 2020-08-03: qty 8

## 2020-08-03 NOTE — Progress Notes (Signed)
1545- Patient tolerated treatment well. Patient stable and discharged to home at this time.

## 2020-08-04 LAB — T4: T4, Total: 4.7 ug/dL (ref 4.5–12.0)

## 2020-08-05 NOTE — Progress Notes (Signed)
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 Richardson Landry for symptoms of sore throat and difficulty swallowing which she has been experiencing over the last 1 year.He underwent a comprehensive auto laryngoscopy exam which showed a firm enlarged right tonsillar mass with necrotic area extending onto the adjacent soft palate into the root of the uvula. Hypopharynx and larynx as well as nasopharynx was unable to be visualized due to gag reflex. This was biopsied and was consistent with nonkeratinizing invasive squamous cell carcinoma positive for p16.PDL 1 70%  PET CT scan showed large hypermetabolic right palatine tonsil mass which crosses the midline. Ipsilateral cervical nodal metastases. Bilateral pulmonary nodules including a dominant 1 cm hypermetabolic right lung base nodule. Multiplicity favor pulmonary metastases. Subcarinal and equivocal left hilar nodal metastases. Hypermetabolism involving the ascending colon. Question mild diverticulitis versus underlying colonic mass or polyp cannot be excluded  Patient underwent EBUS guided biopsy ofhilarlymph nodes. Biopsy showed squamous cell carcinoma. However HPV/p16 status was uninterpretable.  Patient completed 6 cycles of carbotaxol chemotherapy along with Keytruda and is currently on maintenance Keytruda. Scans show far for excellent  response to treatment   Interval history-no acute issues since last visit.  He has mild pain during swallowing and chronic back pain which is unchanged.  ECOG PS- 1 Pain scale- 0 Opioid associated constipation- no  Review of systems- Review of Systems  Constitutional: Positive for malaise/fatigue. Negative for chills, fever and weight loss.  HENT: Negative for congestion, ear discharge and nosebleeds.   Eyes: Negative for blurred vision.  Respiratory: Negative for cough, hemoptysis, sputum production, shortness of breath and wheezing.   Cardiovascular: Negative for chest pain, palpitations, orthopnea and claudication.  Gastrointestinal: Negative for abdominal pain, blood in stool, constipation, diarrhea, heartburn, melena, nausea and vomiting.  Genitourinary: Negative for dysuria, flank pain, frequency, hematuria and urgency.  Musculoskeletal: Negative for back pain, joint pain and myalgias.  Skin: Negative for rash.  Neurological: Negative for dizziness, tingling, focal weakness, seizures, weakness and headaches.  Endo/Heme/Allergies: Does not bruise/bleed easily.  Psychiatric/Behavioral: Negative for depression and suicidal ideas. The patient does not have insomnia.       No Known Allergies   Past Medical History:  Diagnosis Date  . Asthma   . Lung cancer ([place])   . Lung nodules   . Tonsil cancer [facility])      Past Surgical History:  Procedure Laterality Date  . CERVICAL SPINE SURGERY    . LUMBAR SPINE SURGERY    . PORTA CATH INSERTION N/A 10/08/2019   Procedure: PORTA CATH INSERTION;  Surgeon: Katha Cabal, MD;  Location: [place] CV LAB;  Service: Cardiovascular;  Laterality: N/A;  . VIDEO BRONCHOSCOPY WITH ENDOBRONCHIAL NAVIGATION N/A 10/10/2019   Procedure: VIDEO BRONCHOSCOPY WITH ENDOBRONCHIAL NAVIGATION;  Surgeon: Tyler Pita, MD;  Location: [facility] ORS;  Service: Pulmonary;  Laterality: N/A;  . VIDEO BRONCHOSCOPY WITH ENDOBRONCHIAL ULTRASOUND N/A  10/10/2019   Procedure: VIDEO BRONCHOSCOPY  WITH ENDOBRONCHIAL ULTRASOUND;  Surgeon: Tyler Pita, MD;  Location: [facility] ORS;  Service: Pulmonary;  Laterality: N/A;    Social History   Socioeconomic History  . Marital status: Widowed    Spouse name: Not on file  . Number of children: Not on file  . Years of education: Not on file  . Highest education level: Not on file  Occupational History  . Not on file  Tobacco Use  . Smoking status: Never Smoker  . Smokeless tobacco: Never Used  Vaping Use  . Vaping Use: Never used  Substance and Sexual Activity  . Alcohol use: Never  . Drug use: Never  . Sexual activity: Not on file  Other Topics Concern  . Not on file  Social History Narrative  . Not on file   Social Determinants of Health   Financial Resource Strain: Not on file  Food Insecurity: Not on file  Transportation Needs: Not on file  Physical Activity: Not on file  Stress: Not on file  Social Connections: Not on file  Intimate Partner Violence: Not on file    Family History  Problem Relation Age of Onset  . Cancer Mother 33       not sure what type  . Prostate cancer Father   . Rheum arthritis Father   . Healthy Sister   . Healthy Brother   . Healthy Sister   . Healthy Brother   . Healthy Brother   . Heart disease Brother   . Heart disease Brother      Current Outpatient Medications:  .  acetaminophen (TYLENOL) 500 MG tablet, Take 500-1,000 mg by mouth every 8 (eight) hours as needed (for pain.). , Disp: , Rfl:  .  SYMBICORT 160-4.5 MCG/ACT inhaler, Inhale 2 puffs into the lungs 2 (two) times daily., Disp: 1 Inhaler, Rfl: 2 .  albuterol (VENTOLIN HFA) 108 (90 Base) MCG/ACT inhaler, Inhale 2 puffs into the lungs every 6 (six) hours as needed (wheezing/shortness of breath).  (Patient not taking: Reported on 08/03/2020), Disp: , Rfl:  .  lidocaine-prilocaine (EMLA) cream, Apply to affected area once (Patient not taking: Reported on 04/20/2020), Disp: 30 g, Rfl:  3 .  LORazepam (ATIVAN) 0.5 MG tablet, Take 1 tablet (0.5 mg total) by mouth every 6 (six) hours as needed (Nausea or vomiting). (Patient not taking: Reported on 03/30/2020), Disp: 30 tablet, Rfl: 0 .  OLANZapine (ZYPREXA) 10 MG tablet, Take 1 tablet (10 mg total) by mouth at bedtime. (Patient not taking: Reported on 03/30/2020), Disp: 30 tablet, Rfl: 2 .  ondansetron (ZOFRAN) 8 MG tablet, Take 1 tablet (8 mg total) by mouth 2 (two) times daily as needed. Start on the third day after chemotherapy. (Patient not taking: Reported on 03/30/2020), Disp: 30 tablet, Rfl: 1 .  oxyCODONE (OXY IR/ROXICODONE) 5 MG immediate release tablet, Take 1 tablet (5 mg total) by mouth every 4 (four) hours as needed for severe pain. (Patient not taking: Reported on 08/03/2020), Disp: 60 tablet, Rfl: 0 .  potassium chloride (KLOR-CON) 20 MEQ packet, Take 20 mEq by mouth daily. (Patient not taking: Reported on 03/30/2020), Disp: 30 packet, Rfl: 1 .  prochlorperazine (COMPAZINE) 10 MG tablet, Take 1 tablet (10 mg total) by mouth every 6 (six) hours as needed (Nausea or vomiting). (Patient not taking: Reported on 03/30/2020), Disp: 30 tablet, Rfl: 1 No current facility-administered medications for this visit.  Facility-Administered Medications Ordered in Other Visits:  .  sodium chloride flush (NS) 0.9 % injection 10 mL,  10 mL, Intravenous, PRN, Sindy Guadeloupe, MD, 10 mL at 03/09/20 0919  Physical exam:  Vitals:   08/03/20 1344  BP: 119/75  Pulse: 70  Resp: 18  Temp: 97.6 F (36.4 C)  TempSrc: Tympanic  SpO2: 99%  Weight: 191 lb 1.6 oz (86.7 kg)   Physical Exam Constitutional:      General: He is not in acute distress. Eyes:     Extraocular Movements: EOM normal.  Neck:     Comments: No palpable cervical adenopathy Cardiovascular:     Rate and Rhythm: Normal rate and regular rhythm.     Heart sounds: Normal heart sounds.  Pulmonary:     Effort: Pulmonary effort is normal.     Breath sounds: Normal breath sounds.   Abdominal:     General: Bowel sounds are normal.     Palpations: Abdomen is soft.  Skin:    General: Skin is warm and dry.  Neurological:     Mental Status: He is alert and oriented to person, place, and time.      CMP Latest Ref Rng & Units 08/03/2020  Glucose 70 - 99 mg/dL 108(H)  BUN 8 - 23 mg/dL 15  Creatinine 0.61 - 1.24 mg/dL 0.77  Sodium 135 - 145 mmol/L 139  Potassium 3.5 - 5.1 mmol/L 3.7  Chloride 98 - 111 mmol/L 103  CO2 22 - 32 mmol/L 25  Calcium 8.9 - 10.3 mg/dL 9.2  Total Protein 6.5 - 8.1 g/dL 7.3  Total Bilirubin 0.3 - 1.2 mg/dL 0.6  Alkaline Phos 38 - 126 U/L 72  AST 15 - 41 U/L 18  ALT 0 - 44 U/L 11   CBC Latest Ref Rng & Units 08/03/2020  WBC 4.0 - 10.5 K/uL 5.4  Hemoglobin 13.0 - 17.0 g/dL 13.2  Hematocrit 39.0 - 52.0 % 38.2(L)  Platelets 150 - 400 K/uL 243      Assessment and plan- Patient is a 69 y.o. male withsquamous cell carcinoma of the right tonsil likely stage IVB cT3N 1M1 with lung and hilar lymph node metastases.He is s/p 6 cycles of carbotaxol Keytruda.   He is here for on treatment assessment prior to cycle 9 of palliative Keytruda  Counts okay to proceed with cycle 9 of palliative Keytruda today.  I will see him back in 3 weeks for cycle 10.  Plan to repeat scans in 1 to 2 months.  He is tolerating immunotherapy well without any significant side effects   Visit Diagnosis 1. Encounter for antineoplastic immunotherapy   2. 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 Randa Evens, MD, MPH [facility] at [facility] [phone] 08/05/2020 4:29 PM

## 2020-08-24 ENCOUNTER — Other Ambulatory Visit: Payer: Self-pay

## 2020-08-24 ENCOUNTER — Inpatient Hospital Stay (HOSPITAL_BASED_OUTPATIENT_CLINIC_OR_DEPARTMENT_OTHER): Payer: Medicare Other | Admitting: Oncology

## 2020-08-24 ENCOUNTER — Inpatient Hospital Stay: Payer: Medicare Other

## 2020-08-24 ENCOUNTER — Encounter: Payer: Self-pay | Admitting: Oncology

## 2020-08-24 VITALS — BP 114/73 | HR 67 | Temp 98.2°F | Resp 16 | Ht 70.0 in | Wt 194.0 lb

## 2020-08-24 DIAGNOSIS — G893 Neoplasm related pain (acute) (chronic): Secondary | ICD-10-CM | POA: Diagnosis not present

## 2020-08-24 DIAGNOSIS — Z5112 Encounter for antineoplastic immunotherapy: Secondary | ICD-10-CM | POA: Diagnosis not present

## 2020-08-24 DIAGNOSIS — C099 Malignant neoplasm of tonsil, unspecified: Secondary | ICD-10-CM

## 2020-08-24 LAB — CBC WITH DIFFERENTIAL/PLATELET
Abs Immature Granulocytes: 0.02 10*3/uL (ref 0.00–0.07)
Basophils Absolute: 0 10*3/uL (ref 0.0–0.1)
Basophils Relative: 0 %
Eosinophils Absolute: 0.3 10*3/uL (ref 0.0–0.5)
Eosinophils Relative: 4 %
HCT: 38.5 % — ABNORMAL LOW (ref 39.0–52.0)
Hemoglobin: 12.9 g/dL — ABNORMAL LOW (ref 13.0–17.0)
Immature Granulocytes: 0 %
Lymphocytes Relative: 24 %
Lymphs Abs: 1.4 10*3/uL (ref 0.7–4.0)
MCH: 32.2 pg (ref 26.0–34.0)
MCHC: 33.5 g/dL (ref 30.0–36.0)
MCV: 96 fL (ref 80.0–100.0)
Monocytes Absolute: 0.7 10*3/uL (ref 0.1–1.0)
Monocytes Relative: 11 %
Neutro Abs: 3.5 10*3/uL (ref 1.7–7.7)
Neutrophils Relative %: 61 %
Platelets: 214 10*3/uL (ref 150–400)
RBC: 4.01 MIL/uL — ABNORMAL LOW (ref 4.22–5.81)
RDW: 12.5 % (ref 11.5–15.5)
WBC: 5.9 10*3/uL (ref 4.0–10.5)
nRBC: 0 % (ref 0.0–0.2)

## 2020-08-24 LAB — COMPREHENSIVE METABOLIC PANEL
ALT: 10 U/L (ref 0–44)
AST: 17 U/L (ref 15–41)
Albumin: 3.9 g/dL (ref 3.5–5.0)
Alkaline Phosphatase: 79 U/L (ref 38–126)
Anion gap: 10 (ref 5–15)
BUN: 19 mg/dL (ref 8–23)
CO2: 23 mmol/L (ref 22–32)
Calcium: 8.8 mg/dL — ABNORMAL LOW (ref 8.9–10.3)
Chloride: 105 mmol/L (ref 98–111)
Creatinine, Ser: 0.81 mg/dL (ref 0.61–1.24)
GFR, Estimated: >60 mL/min (ref >60)
Glucose, Bld: 98 mg/dL (ref 70–99)
Potassium: 4.2 mmol/L (ref 3.5–5.1)
Sodium: 138 mmol/L (ref 135–145)
Total Bilirubin: 0.5 mg/dL (ref 0.3–1.2)
Total Protein: 7.1 g/dL (ref 6.5–8.1)

## 2020-08-24 MED ORDER — SODIUM CHLORIDE 0.9% FLUSH
10.0000 mL | Freq: Once | INTRAVENOUS | Status: AC
  Administered 2020-08-24: 10:00:00 10 mL via INTRAVENOUS
  Filled 2020-08-24: qty 10

## 2020-08-24 MED ORDER — HEPARIN SOD (PORK) LOCK FLUSH 100 UNIT/ML IV SOLN
INTRAVENOUS | Status: AC
  Filled 2020-08-24: qty 5

## 2020-08-24 MED ORDER — PROCHLORPERAZINE EDISYLATE 10 MG/2ML IJ SOLN
10.0000 mg | Freq: Once | INTRAMUSCULAR | Status: AC
  Administered 2020-08-24: 12:00:00 10 mg via INTRAVENOUS
  Filled 2020-08-24: qty 2

## 2020-08-24 MED ORDER — SODIUM CHLORIDE 0.9 % IV SOLN
Freq: Once | INTRAVENOUS | Status: AC
  Filled 2020-08-24: qty 250

## 2020-08-24 MED ORDER — SYMBICORT 160-4.5 MCG/ACT IN AERO: 2.0000 | INHALATION_SPRAY | Freq: Two times a day (BID) | RESPIRATORY_TRACT | 2 refills | Status: DC

## 2020-08-24 MED ORDER — HEPARIN SOD (PORK) LOCK FLUSH 100 UNIT/ML IV SOLN
500.0000 [IU] | Freq: Once | INTRAVENOUS | Status: AC
  Administered 2020-08-24: 13:00:00 500 [IU] via INTRAVENOUS
  Filled 2020-08-24: qty 5

## 2020-08-24 MED ORDER — OXYCODONE HCL 5 MG PO TABS: 5.0000 mg | ORAL_TABLET | ORAL | 0 refills | Status: DC | PRN

## 2020-08-24 MED ORDER — SODIUM CHLORIDE 0.9 % IV SOLN
200.0000 mg | Freq: Once | INTRAVENOUS | Status: AC
  Administered 2020-08-24: 12:00:00 200 mg via INTRAVENOUS
  Filled 2020-08-24: qty 8

## 2020-08-24 NOTE — Progress Notes (Signed)
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 Richardson Landry for symptoms of sore throat and difficulty swallowing which she has been experiencing over the last 1 year.He underwent a comprehensive auto laryngoscopy exam which showed a firm enlarged right tonsillar mass with necrotic area extending onto the adjacent soft palate into the root of the uvula. Hypopharynx and larynx as well as nasopharynx was unable to be visualized due to gag reflex. This was biopsied and was consistent with nonkeratinizing invasive squamous cell carcinoma positive for p16.PDL 1 70%  PET CT scan showed large hypermetabolic right palatine tonsil mass which crosses the midline. Ipsilateral cervical nodal metastases. Bilateral pulmonary nodules including a dominant 1 cm hypermetabolic right lung base nodule. Multiplicity favor pulmonary metastases. Subcarinal and equivocal left hilar nodal metastases. Hypermetabolism involving the ascending colon. Question mild diverticulitis versus underlying colonic mass or polyp cannot be excluded  Patient underwent EBUS guided biopsy ofhilarlymph nodes. Biopsy showed squamous cell carcinoma. However HPV/p16 status was uninterpretable.  Patient completed 6 cycles of carbotaxol chemotherapy along with Keytruda and is currently on maintenance Keytruda. Scans show far for excellent  response to treatment   Interval history-doing well overall.  Reports occasional difficulty swallowing but denies other complaints at this time  ECOG PS- 1 Pain scale- 0 Opioid associated constipation- no  Review of systems- Review of Systems  Constitutional: Negative for chills, fever, malaise/fatigue and weight loss.  HENT: Negative for congestion, ear discharge and nosebleeds.   Eyes: Negative for blurred vision.  Respiratory: Negative for cough, hemoptysis, sputum production, shortness of breath and wheezing.   Cardiovascular: Negative for chest pain, palpitations, orthopnea and claudication.  Gastrointestinal: Negative for abdominal pain, blood in stool, constipation, diarrhea, heartburn, melena, nausea and vomiting.  Genitourinary: Negative for dysuria, flank pain, frequency, hematuria and urgency.  Musculoskeletal: Negative for back pain, joint pain and myalgias.  Skin: Negative for rash.  Neurological: Negative for dizziness, tingling, focal weakness, seizures, weakness and headaches.  Endo/Heme/Allergies: Does not bruise/bleed easily.  Psychiatric/Behavioral: Negative for depression and suicidal ideas. The patient does not have insomnia.      No Known Allergies   Past Medical History:  Diagnosis Date  . Asthma   . Lung cancer ([place])   . Lung nodules   . Tonsil cancer [facility])      Past Surgical History:  Procedure Laterality Date  . CERVICAL SPINE SURGERY    . LUMBAR SPINE SURGERY    . PORTA CATH INSERTION N/A 10/08/2019   Procedure: PORTA CATH INSERTION;  Surgeon: Katha Cabal, MD;  Location: [place] CV LAB;  Service: Cardiovascular;  Laterality: N/A;  . VIDEO BRONCHOSCOPY WITH ENDOBRONCHIAL NAVIGATION N/A 10/10/2019   Procedure: VIDEO BRONCHOSCOPY WITH ENDOBRONCHIAL NAVIGATION;  Surgeon: Tyler Pita, MD;  Location: [facility] ORS;  Service: Pulmonary;  Laterality: N/A;  . VIDEO BRONCHOSCOPY WITH ENDOBRONCHIAL ULTRASOUND N/A 10/10/2019   Procedure: VIDEO  BRONCHOSCOPY WITH ENDOBRONCHIAL ULTRASOUND;  Surgeon: Tyler Pita,  MD;  Location: [facility] ORS;  Service: Pulmonary;  Laterality: N/A;    Social History   Socioeconomic History  . Marital status: Widowed    Spouse name: Not on file  . Number of children: Not on file  . Years of education: Not on file  . Highest education level: Not on file  Occupational History  . Not on file  Tobacco Use  . Smoking status: Never Smoker  . Smokeless tobacco: Never Used  Vaping Use  . Vaping Use: Never used  Substance and Sexual Activity  . Alcohol use: Never  . Drug use: Never  . Sexual activity: Not on file  Other Topics Concern  . Not on file  Social History Narrative  . Not on file   Social Determinants of Health   Financial Resource Strain: Not on file  Food Insecurity: Not on file  Transportation Needs: Not on file  Physical Activity: Not on file  Stress: Not on file  Social Connections: Not on file  Intimate Partner Violence: Not on file    Family History  Problem Relation Age of Onset  . Cancer Mother 77       not sure what type  . Prostate cancer Father   . Rheum arthritis Father   . Healthy Sister   . Healthy Brother   . Healthy Sister   . Healthy Brother   . Healthy Brother   . Heart disease Brother   . Heart disease Brother      Current Outpatient Medications:  .  acetaminophen (TYLENOL) 500 MG tablet, Take 500-1,000 mg by mouth every 8 (eight) hours as needed (for pain.). , Disp: , Rfl:  .  albuterol (VENTOLIN HFA) 108 (90 Base) MCG/ACT inhaler, Inhale 2 puffs into the lungs every 6 (six) hours as needed (wheezing/shortness of breath).  (Patient not taking: Reported on 08/03/2020), Disp: , Rfl:  .  lidocaine-prilocaine (EMLA) cream, Apply to affected area once (Patient not taking: Reported on 04/20/2020), Disp: 30 g, Rfl: 3 .  LORazepam (ATIVAN) 0.5 MG tablet, Take 1 tablet (0.5 mg total) by mouth every 6 (six) hours as needed (Nausea or vomiting). (Patient not  taking: Reported on 03/30/2020), Disp: 30 tablet, Rfl: 0 .  OLANZapine (ZYPREXA) 10 MG tablet, Take 1 tablet (10 mg total) by mouth at bedtime. (Patient not taking: Reported on 03/30/2020), Disp: 30 tablet, Rfl: 2 .  ondansetron (ZOFRAN) 8 MG tablet, Take 1 tablet (8 mg total) by mouth 2 (two) times daily as needed. Start on the third day after chemotherapy. (Patient not taking: Reported on 03/30/2020), Disp: 30 tablet, Rfl: 1 .  oxyCODONE (OXY IR/ROXICODONE) 5 MG immediate release tablet, Take 1 tablet (5 mg total) by mouth every 4 (four) hours as needed for severe pain. (Patient not taking: Reported on 08/03/2020), Disp: 60 tablet, Rfl: 0 .  potassium chloride (KLOR-CON) 20 MEQ packet, Take 20 mEq by mouth daily. (Patient not taking: Reported on 03/30/2020), Disp: 30 packet, Rfl: 1 .  prochlorperazine (COMPAZINE) 10 MG tablet, Take 1 tablet (10 mg total) by mouth every 6 (six) hours as needed (Nausea or vomiting). (Patient not taking: Reported on 03/30/2020), Disp: 30 tablet, Rfl: 1 .  SYMBICORT 160-4.5 MCG/ACT inhaler, Inhale 2 puffs into the lungs 2 (two) times daily., Disp: 1 Inhaler, Rfl: 2 No current facility-administered medications for this visit.  Facility-Administered Medications Ordered in Other Visits:  .  sodium chloride flush (NS) 0.9 % injection 10 mL, 10 mL, Intravenous, PRN, Sindy Guadeloupe, MD,  10 mL at 03/09/20 0919  Physical exam:  Vitals:   08/24/20 1045  BP: 114/73  Pulse: 67  Resp: 16  Temp: 98.2 F (36.8 C)  TempSrc: Oral  Weight: 194 lb (88 kg)  Height: 5\' 10"  (1.778 m)   Physical Exam Eyes:     Extraocular Movements: EOM normal.  Cardiovascular:     Rate and Rhythm: Normal rate and regular rhythm.     Heart sounds: Normal heart sounds.  Pulmonary:     Effort: Pulmonary effort is normal.     Breath sounds: Normal breath sounds.  Abdominal:     General: Bowel sounds are normal.     Palpations: Abdomen is soft.  Lymphadenopathy:     Comments: No palpable cervical  adenopathy  Skin:    General: Skin is warm and dry.  Neurological:     Mental Status: He is alert and oriented to person, place, and time.      CMP Latest Ref Rng & Units 08/24/2020  Glucose 70 - 99 mg/dL 98  BUN 8 - 23 mg/dL 19  Creatinine 0.61 - 1.24 mg/dL 0.81  Sodium 135 - 145 mmol/L 138  Potassium 3.5 - 5.1 mmol/L 4.2  Chloride 98 - 111 mmol/L 105  CO2 22 - 32 mmol/L 23  Calcium 8.9 - 10.3 mg/dL 8.8(L)  Total Protein 6.5 - 8.1 g/dL 7.1  Total Bilirubin 0.3 - 1.2 mg/dL 0.5  Alkaline Phos 38 - 126 U/L 79  AST 15 - 41 U/L 17  ALT 0 - 44 U/L 10   CBC Latest Ref Rng & Units 08/24/2020  WBC 4.0 - 10.5 K/uL 5.9  Hemoglobin 13.0 - 17.0 g/dL 12.9(L)  Hematocrit 39.0 - 52.0 % 38.5(L)  Platelets 150 - 400 K/uL 214      Assessment and plan- Patient is a 69 y.o. male withsquamous cell carcinoma of the right tonsil likely stage IVB cT3N 1M1 with lung and hilar lymph node metastases.He is s/p 6 cycles of carbotaxol Keytruda.   He is here for on treatment assessment prior to cycle 10 of palliative Keytruda  Counts okay to proceed with cycle 10 of palliative Keytruda today.  I will see him in 3 weeks for cycle 11.  Plan to repeat scans after 12 cycles.   Neoplasm related pain: Patient reports occasional difficulty swallowing for which he uses as needed oxycodone.  I have refilled her prescription today  Visit Diagnosis 1. Encounter for antineoplastic immunotherapy   2. 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 Neoplasm related pain      Dr. Randa Evens, MD, MPH [facility] at [facility] [phone] 08/24/2020 8:01 AM

## 2020-08-24 NOTE — Progress Notes (Signed)
Pt has throat pain and back pain at times. Eating and drinking good. Some of the foods are harder to get down and has to work on it. Needs refill of oxycodone and symbicort

## 2020-08-24 NOTE — Progress Notes (Signed)
Stable at discharge 

## 2020-08-24 NOTE — Addendum Note (Signed)
Addended by: Randa Evens C on: 08/24/2020 11:27 AM   Modules accepted: Orders

## 2020-09-14 ENCOUNTER — Other Ambulatory Visit: Payer: Self-pay

## 2020-09-14 ENCOUNTER — Inpatient Hospital Stay: Payer: Medicare Other | Attending: Oncology | Admitting: Oncology

## 2020-09-14 ENCOUNTER — Inpatient Hospital Stay: Payer: Medicare Other

## 2020-09-14 ENCOUNTER — Encounter: Payer: Self-pay | Admitting: Oncology

## 2020-09-14 VITALS — BP 126/80 | HR 66 | Temp 98.3°F | Resp 16 | Ht 70.0 in | Wt 195.0 lb

## 2020-09-14 DIAGNOSIS — Z5112 Encounter for antineoplastic immunotherapy: Secondary | ICD-10-CM | POA: Diagnosis present

## 2020-09-14 DIAGNOSIS — Z9221 Personal history of antineoplastic chemotherapy: Secondary | ICD-10-CM | POA: Diagnosis not present

## 2020-09-14 DIAGNOSIS — Z79899 Other long term (current) drug therapy: Secondary | ICD-10-CM | POA: Diagnosis not present

## 2020-09-14 DIAGNOSIS — C7800 Secondary malignant neoplasm of unspecified lung: Secondary | ICD-10-CM | POA: Diagnosis not present

## 2020-09-14 DIAGNOSIS — C771 Secondary and unspecified malignant neoplasm of intrathoracic lymph nodes: Secondary | ICD-10-CM | POA: Insufficient documentation

## 2020-09-14 DIAGNOSIS — C099 Malignant neoplasm of tonsil, unspecified: Secondary | ICD-10-CM

## 2020-09-14 DIAGNOSIS — G893 Neoplasm related pain (acute) (chronic): Secondary | ICD-10-CM | POA: Diagnosis not present

## 2020-09-14 LAB — CBC WITH DIFFERENTIAL/PLATELET
Abs Immature Granulocytes: 0.03 10*3/uL (ref 0.00–0.07)
Basophils Absolute: 0 10*3/uL (ref 0.0–0.1)
Basophils Relative: 0 %
Eosinophils Absolute: 0.2 10*3/uL (ref 0.0–0.5)
Eosinophils Relative: 2 %
HCT: 38.7 % — ABNORMAL LOW (ref 39.0–52.0)
Hemoglobin: 13.1 g/dL (ref 13.0–17.0)
Immature Granulocytes: 1 %
Lymphocytes Relative: 17 %
Lymphs Abs: 1 10*3/uL (ref 0.7–4.0)
MCH: 32.2 pg (ref 26.0–34.0)
MCHC: 33.9 g/dL (ref 30.0–36.0)
MCV: 95.1 fL (ref 80.0–100.0)
Monocytes Absolute: 0.6 10*3/uL (ref 0.1–1.0)
Monocytes Relative: 10 %
Neutro Abs: 4.3 10*3/uL (ref 1.7–7.7)
Neutrophils Relative %: 70 %
Platelets: 230 10*3/uL (ref 150–400)
RBC: 4.07 MIL/uL — ABNORMAL LOW (ref 4.22–5.81)
RDW: 12.7 % (ref 11.5–15.5)
WBC: 6.2 10*3/uL (ref 4.0–10.5)
nRBC: 0 % (ref 0.0–0.2)

## 2020-09-14 LAB — COMPREHENSIVE METABOLIC PANEL
ALT: 17 U/L (ref 0–44)
AST: 25 U/L (ref 15–41)
Albumin: 3.9 g/dL (ref 3.5–5.0)
Alkaline Phosphatase: 72 U/L (ref 38–126)
Anion gap: 8 (ref 5–15)
BUN: 19 mg/dL (ref 8–23)
CO2: 22 mmol/L (ref 22–32)
Calcium: 8.5 mg/dL — ABNORMAL LOW (ref 8.9–10.3)
Chloride: 108 mmol/L (ref 98–111)
Creatinine, Ser: 0.7 mg/dL (ref 0.61–1.24)
GFR, Estimated: >60 mL/min (ref >60)
Glucose, Bld: 106 mg/dL — ABNORMAL HIGH (ref 70–99)
Potassium: 3.8 mmol/L (ref 3.5–5.1)
Sodium: 138 mmol/L (ref 135–145)
Total Bilirubin: 0.5 mg/dL (ref 0.3–1.2)
Total Protein: 7.3 g/dL (ref 6.5–8.1)

## 2020-09-14 MED ORDER — HEPARIN SOD (PORK) LOCK FLUSH 100 UNIT/ML IV SOLN
500.0000 [IU] | Freq: Once | INTRAVENOUS | Status: AC | PRN
  Administered 2020-09-14: 500 [IU]
  Filled 2020-09-14: qty 5

## 2020-09-14 MED ORDER — SODIUM CHLORIDE 0.9 % IV SOLN
Freq: Once | INTRAVENOUS | Status: AC
  Filled 2020-09-14: qty 250

## 2020-09-14 MED ORDER — SODIUM CHLORIDE 0.9 % IV SOLN
200.0000 mg | Freq: Once | INTRAVENOUS | Status: AC
  Administered 2020-09-14: 200 mg via INTRAVENOUS
  Filled 2020-09-14: qty 8

## 2020-09-14 MED ORDER — HEPARIN SOD (PORK) LOCK FLUSH 100 UNIT/ML IV SOLN
INTRAVENOUS | Status: AC
  Filled 2020-09-14: qty 5

## 2020-09-14 MED ORDER — PROCHLORPERAZINE EDISYLATE 10 MG/2ML IJ SOLN
10.0000 mg | Freq: Once | INTRAMUSCULAR | Status: AC
  Administered 2020-09-14: 10 mg via INTRAVENOUS
  Filled 2020-09-14: qty 2

## 2020-09-14 NOTE — Progress Notes (Signed)
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 Richardson Landry for symptoms of sore throat and difficulty swallowing which she has been experiencing over the last 1 year.He underwent a comprehensive auto laryngoscopy exam which showed a firm enlarged right tonsillar mass with necrotic area extending onto the adjacent soft palate into the root of the uvula. Hypopharynx and larynx as well as nasopharynx was unable to be visualized due to gag reflex. This was biopsied and was consistent with nonkeratinizing invasive squamous cell carcinoma positive for p16.PDL 1 70%  PET CT scan showed large hypermetabolic right palatine tonsil mass which crosses the midline. Ipsilateral cervical nodal metastases. Bilateral pulmonary nodules including a dominant 1 cm hypermetabolic right lung base nodule. Multiplicity favor pulmonary metastases. Subcarinal and equivocal left hilar nodal metastases. Hypermetabolism involving the ascending colon. Question mild diverticulitis versus underlying colonic mass or polyp cannot be excluded  Patient underwent EBUS guided biopsy ofhilarlymph nodes. Biopsy showed squamous cell carcinoma. However HPV/p16 status was uninterpretable.  Patient completed 6 cycles of carbotaxol chemotherapy along with Keytruda and is currently on maintenance Keytruda. Scans show far for excellent  response to treatment  Interval history-patient is doing well and denies any complaints at this time.  Appetite and weight have remained stable.  ECOG PS- 1 Pain scale- 0 Opioid associated constipation- no  Review of systems- Review of Systems  Constitutional: Negative for chills, fever, malaise/fatigue and weight loss.  HENT: Negative for congestion, ear discharge and nosebleeds.   Eyes: Negative for blurred vision.  Respiratory: Negative for cough, hemoptysis, sputum production, shortness of breath and wheezing.   Cardiovascular: Negative for chest pain, palpitations, orthopnea and claudication.  Gastrointestinal: Negative for abdominal pain, blood in stool, constipation, diarrhea, heartburn, melena, nausea and vomiting.  Genitourinary: Negative for dysuria, flank pain, frequency, hematuria and urgency.  Musculoskeletal: Negative for back pain, joint pain and myalgias.  Skin: Negative for rash.  Neurological: Negative for dizziness, tingling, focal weakness, seizures, weakness and headaches.  Endo/Heme/Allergies: Does not bruise/bleed easily.  Psychiatric/Behavioral: Negative for depression and suicidal ideas. The patient does not have insomnia.       No Known Allergies   Past Medical History:  Diagnosis Date  . Asthma   . Lung cancer ([place])   . Lung nodules   . Metastasis to lung ([place])   . Tonsil cancer ([place])   . Tonsil cancer [facility])      Past Surgical History:  Procedure Laterality Date  . CERVICAL SPINE SURGERY    . LUMBAR SPINE SURGERY    . PORTA CATH INSERTION N/A 10/08/2019   Procedure: PORTA CATH INSERTION;  Surgeon: Katha Cabal, MD;  Location: [place] [facility];  Service: Cardiovascular;  Laterality: N/A;  . VIDEO BRONCHOSCOPY WITH ENDOBRONCHIAL NAVIGATION N/A 10/10/2019   Procedure: VIDEO BRONCHOSCOPY WITH ENDOBRONCHIAL NAVIGATION;  Surgeon: Tyler Pita, MD;  Location: [facility];  Service: Pulmonary;  Laterality: N/A;  . VIDEO BRONCHOSCOPY WITH  ENDOBRONCHIAL ULTRASOUND  N/A 10/10/2019   Procedure: VIDEO BRONCHOSCOPY WITH ENDOBRONCHIAL ULTRASOUND;  Surgeon: Tyler Pita, MD;  Location: [facility];  Service: Pulmonary;  Laterality: N/A;    Social History   Socioeconomic History  . Marital status: Widowed    Spouse name: Not on file  . Number of children: Not on file  . Years of education: Not on file  . Highest education level: Not on file  Occupational History  . Not on file  Tobacco Use  . Smoking status: Never Smoker  . Smokeless tobacco: Never Used  Vaping Use  . Vaping Use: Never used  Substance and Sexual Activity  . Alcohol use: Never  . Drug use: Never  . Sexual activity: Not Currently  Other Topics Concern  . Not on file  Social History Narrative  . Not on file   Social Determinants of Health   Financial Resource Strain: Not on file  Food Insecurity: Not on file  Transportation Needs: Not on file  Physical Activity: Not on file  Stress: Not on file  Social Connections: Not on file  Intimate Partner Violence: Not on file    Family History  Problem Relation Age of Onset  . Cancer Mother 23       not sure what type  . Prostate cancer Father   . Rheum arthritis Father   . Healthy Sister   . Healthy Brother   . Healthy Sister   . Healthy Brother   . Healthy Brother   . Heart disease Brother   . Heart disease Brother      Current Outpatient Medications:  .  acetaminophen (TYLENOL) 500 MG tablet, Take 500-1,000 mg by mouth every 8 (eight) hours as needed (for pain.). , Disp: , Rfl:  .  albuterol (VENTOLIN HFA) 108 (90 Base) MCG/ACT inhaler, Inhale 2 puffs into the lungs every 6 (six) hours as needed (wheezing/shortness of breath).  (Patient not taking: No sig reported), Disp: , Rfl:  .  lidocaine-prilocaine (EMLA) cream, Apply to affected area once (Patient not taking: No sig reported), Disp: 30 g, Rfl: 3 .  LORazepam (ATIVAN) 0.5 MG tablet, Take 1 tablet (0.5 mg total) by mouth every 6 (six)  hours as needed (Nausea or vomiting). (Patient not taking: No sig reported), Disp: 30 tablet, Rfl: 0 .  OLANZapine (ZYPREXA) 10 MG tablet, Take 1 tablet (10 mg total) by mouth at bedtime. (Patient not taking: No sig reported), Disp: 30 tablet, Rfl: 2 .  ondansetron (ZOFRAN) 8 MG tablet, Take 1 tablet (8 mg total) by mouth 2 (two) times daily as needed. Start on the third day after chemotherapy. (Patient not taking: No sig reported), Disp: 30 tablet, Rfl: 1 .  oxyCODONE (OXY IR/ROXICODONE) 5 MG immediate release tablet, Take 1 tablet (5 mg total) by mouth every 4 (four) hours as needed for severe pain., Disp: 60 tablet, Rfl: 0 .  prochlorperazine (COMPAZINE) 10 MG tablet, Take 1 tablet (10 mg total) by mouth every 6 (six) hours as needed (Nausea or vomiting). (Patient not taking: No sig reported), Disp: 30 tablet, Rfl: 1 .  SYMBICORT 160-4.5 MCG/ACT inhaler, Inhale 2 puffs into the lungs 2 (two) times daily., Disp: 1 each, Rfl: 2 No current facility-administered medications for this visit.  Facility-Administered Medications Ordered in Other Visits:  .  sodium chloride flush (NS) 0.9 % injection 10 mL, 10 mL, Intravenous, PRN, Sindy Guadeloupe, MD, 10 mL at 03/09/20 0919  Physical exam:  Vitals:   09/14/20 1011  BP: 126/80  Pulse: 66  Resp: 16  Temp: 98.3 F (36.8 C)  TempSrc: Oral  Weight: 195 lb (88.5 kg)  Height: 5\' 10"  (1.778 m)   Physical Exam HENT:     Head: Normocephalic and atraumatic.  Eyes:     Extraocular Movements: EOM normal.     Pupils: Pupils are equal, round, and reactive to light.  Cardiovascular:     Rate and Rhythm: Normal rate and regular rhythm.     Heart sounds: Normal heart sounds.  Pulmonary:     Effort: Pulmonary effort is normal.     Breath sounds: Normal breath sounds.  Abdominal:     General: Bowel sounds are normal.     Palpations: Abdomen is soft.  Musculoskeletal:     Cervical back: Normal range of motion.  Skin:    General: Skin is warm and dry.   Neurological:     Mental Status: He is alert and oriented to person, place, and time.      CMP Latest Ref Rng & Units 08/24/2020  Glucose 70 - 99 mg/dL 98  BUN 8 - 23 mg/dL 19  Creatinine 0.61 - 1.24 mg/dL 0.81  Sodium 135 - 145 mmol/L 138  Potassium 3.5 - 5.1 mmol/L 4.2  Chloride 98 - 111 mmol/L 105  CO2 22 - 32 mmol/L 23  Calcium 8.9 - 10.3 mg/dL 8.8(L)  Total Protein 6.5 - 8.1 g/dL 7.1  Total Bilirubin 0.3 - 1.2 mg/dL 0.5  Alkaline Phos 38 - 126 U/L 79  AST 15 - 41 U/L 17  ALT 0 - 44 U/L 10   CBC Latest Ref Rng & Units 09/14/2020  WBC 4.0 - 10.5 K/uL 6.2  Hemoglobin 13.0 - 17.0 g/dL 13.1  Hematocrit 39.0 - 52.0 % 38.7(L)  Platelets 150 - 400 K/uL 230     Assessment and plan- Patient is a 70 y.o. male withsquamous cell carcinoma of the right tonsil likely stage IVB cT3N 1M1 with lung and hilar lymph node metastases.He is s/p 6 cycles of carbotaxol Keytruda.He is here for on treatment assessment prior to cycle 11 of palliative Keytruda  Counts okay to proceed with cycle 11 of palliative Keytruda today.  I will see him back in 3 weeks for cycle 12.  Plan to repeat CT soft tissue neck as well as CT chest abdomen pelvis with contrast in 4 weeks time.  Neoplasm related pain: Patient reports occasional difficulty swallowing for which she uses as needed oxycodone which I will continue   Visit Diagnosis 1. Encounter for antineoplastic immunotherapy   2. 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 Neoplasm related pain      Dr. Randa Evens, MD, MPH [facility] [phone] 09/14/2020 10:21 AM

## 2020-09-14 NOTE — Progress Notes (Signed)
Pt tolerated treatment well with no signs of complications. VSS. Pt stable for discharge.   Aqib Lough [employer]

## 2020-09-14 NOTE — Progress Notes (Signed)
No concerns from pt standpoint.

## 2020-09-14 NOTE — Addendum Note (Signed)
Addended by: Kern Alberta on: 09/14/2020 10:45 AM   Modules accepted: Orders

## 2020-10-05 ENCOUNTER — Encounter: Payer: Self-pay | Admitting: Oncology

## 2020-10-05 ENCOUNTER — Inpatient Hospital Stay: Payer: Medicare Other | Attending: Oncology

## 2020-10-05 ENCOUNTER — Inpatient Hospital Stay: Payer: Medicare Other

## 2020-10-05 ENCOUNTER — Other Ambulatory Visit: Payer: Self-pay | Admitting: *Deleted

## 2020-10-05 ENCOUNTER — Inpatient Hospital Stay (HOSPITAL_BASED_OUTPATIENT_CLINIC_OR_DEPARTMENT_OTHER): Payer: Medicare Other | Admitting: Oncology

## 2020-10-05 VITALS — BP 119/68 | HR 67 | Temp 98.0°F | Resp 16 | Ht 70.0 in | Wt 196.3 lb

## 2020-10-05 DIAGNOSIS — C7800 Secondary malignant neoplasm of unspecified lung: Secondary | ICD-10-CM | POA: Insufficient documentation

## 2020-10-05 DIAGNOSIS — C099 Malignant neoplasm of tonsil, unspecified: Secondary | ICD-10-CM | POA: Diagnosis not present

## 2020-10-05 DIAGNOSIS — Z8249 Family history of ischemic heart disease and other diseases of the circulatory system: Secondary | ICD-10-CM | POA: Diagnosis not present

## 2020-10-05 DIAGNOSIS — Z8042 Family history of malignant neoplasm of prostate: Secondary | ICD-10-CM | POA: Insufficient documentation

## 2020-10-05 DIAGNOSIS — Z7951 Long term (current) use of inhaled steroids: Secondary | ICD-10-CM | POA: Insufficient documentation

## 2020-10-05 DIAGNOSIS — Z5112 Encounter for antineoplastic immunotherapy: Secondary | ICD-10-CM | POA: Insufficient documentation

## 2020-10-05 DIAGNOSIS — Z79899 Other long term (current) drug therapy: Secondary | ICD-10-CM | POA: Insufficient documentation

## 2020-10-05 DIAGNOSIS — C771 Secondary and unspecified malignant neoplasm of intrathoracic lymph nodes: Secondary | ICD-10-CM | POA: Insufficient documentation

## 2020-10-05 DIAGNOSIS — J45909 Unspecified asthma, uncomplicated: Secondary | ICD-10-CM | POA: Insufficient documentation

## 2020-10-05 DIAGNOSIS — Z8261 Family history of arthritis: Secondary | ICD-10-CM | POA: Diagnosis not present

## 2020-10-05 LAB — COMPREHENSIVE METABOLIC PANEL
ALT: 12 U/L (ref 0–44)
AST: 20 U/L (ref 15–41)
Albumin: 3.9 g/dL (ref 3.5–5.0)
Alkaline Phosphatase: 78 U/L (ref 38–126)
Anion gap: 9 (ref 5–15)
BUN: 18 mg/dL (ref 8–23)
CO2: 24 mmol/L (ref 22–32)
Calcium: 8.8 mg/dL — ABNORMAL LOW (ref 8.9–10.3)
Chloride: 106 mmol/L (ref 98–111)
Creatinine, Ser: 0.82 mg/dL (ref 0.61–1.24)
GFR, Estimated: >60 mL/min (ref >60)
Glucose, Bld: 104 mg/dL — ABNORMAL HIGH (ref 70–99)
Potassium: 3.8 mmol/L (ref 3.5–5.1)
Sodium: 139 mmol/L (ref 135–145)
Total Bilirubin: 0.7 mg/dL (ref 0.3–1.2)
Total Protein: 7 g/dL (ref 6.5–8.1)

## 2020-10-05 LAB — CBC WITH DIFFERENTIAL/PLATELET
Abs Immature Granulocytes: 0.02 10*3/uL (ref 0.00–0.07)
Basophils Absolute: 0 10*3/uL (ref 0.0–0.1)
Basophils Relative: 0 %
Eosinophils Absolute: 0.3 10*3/uL (ref 0.0–0.5)
Eosinophils Relative: 5 %
HCT: 37.3 % — ABNORMAL LOW (ref 39.0–52.0)
Hemoglobin: 12.7 g/dL — ABNORMAL LOW (ref 13.0–17.0)
Immature Granulocytes: 0 %
Lymphocytes Relative: 24 %
Lymphs Abs: 1.3 10*3/uL (ref 0.7–4.0)
MCH: 32.3 pg (ref 26.0–34.0)
MCHC: 34 g/dL (ref 30.0–36.0)
MCV: 94.9 fL (ref 80.0–100.0)
Monocytes Absolute: 0.6 10*3/uL (ref 0.1–1.0)
Monocytes Relative: 11 %
Neutro Abs: 3.2 10*3/uL (ref 1.7–7.7)
Neutrophils Relative %: 60 %
Platelets: 238 10*3/uL (ref 150–400)
RBC: 3.93 MIL/uL — ABNORMAL LOW (ref 4.22–5.81)
RDW: 13 % (ref 11.5–15.5)
WBC: 5.4 10*3/uL (ref 4.0–10.5)
nRBC: 0 % (ref 0.0–0.2)

## 2020-10-05 LAB — TSH: TSH: 4.927 u[IU]/mL — ABNORMAL HIGH (ref 0.350–4.500)

## 2020-10-05 MED ORDER — PROCHLORPERAZINE EDISYLATE 10 MG/2ML IJ SOLN
10.0000 mg | Freq: Once | INTRAMUSCULAR | Status: AC
  Administered 2020-10-05: 10 mg via INTRAVENOUS
  Filled 2020-10-05: qty 2

## 2020-10-05 MED ORDER — HEPARIN SOD (PORK) LOCK FLUSH 100 UNIT/ML IV SOLN
INTRAVENOUS | Status: AC
  Filled 2020-10-05: qty 5

## 2020-10-05 MED ORDER — SODIUM CHLORIDE 0.9% FLUSH
10.0000 mL | INTRAVENOUS | Status: DC | PRN
  Administered 2020-10-05: 10 mL via INTRAVENOUS
  Filled 2020-10-05: qty 10

## 2020-10-05 MED ORDER — SODIUM CHLORIDE 0.9 % IV SOLN
200.0000 mg | Freq: Once | INTRAVENOUS | Status: AC
  Administered 2020-10-05: 200 mg via INTRAVENOUS
  Filled 2020-10-05: qty 8

## 2020-10-05 MED ORDER — SODIUM CHLORIDE 0.9 % IV SOLN
Freq: Once | INTRAVENOUS | Status: AC
  Filled 2020-10-05: qty 250

## 2020-10-05 MED ORDER — HEPARIN SOD (PORK) LOCK FLUSH 100 UNIT/ML IV SOLN
500.0000 [IU] | Freq: Once | INTRAVENOUS | Status: AC
  Administered 2020-10-05: 500 [IU] via INTRAVENOUS
  Filled 2020-10-05: qty 5

## 2020-10-05 NOTE — Progress Notes (Signed)
Pt doing ok . Some pain today. No concerns. He does say sometimes his chest hurts especially in cold weather with his asthma but it is not chest pain.

## 2020-10-05 NOTE — Progress Notes (Signed)
1229- Patient tolerated treatment well. Patient stable and discharged to home at this time.

## 2020-10-06 ENCOUNTER — Other Ambulatory Visit: Payer: Self-pay

## 2020-10-06 ENCOUNTER — Other Ambulatory Visit: Payer: Self-pay | Admitting: Oncology

## 2020-10-06 ENCOUNTER — Ambulatory Visit
Admission: RE | Admit: 2020-10-06 | Discharge: 2020-10-06 | Disposition: A | Discharge status: Home / Self Care | Payer: Medicare Other | Source: Ambulatory Visit | Attending: Oncology | Admitting: Oncology

## 2020-10-06 DIAGNOSIS — N281 Cyst of kidney, acquired: Secondary | ICD-10-CM | POA: Diagnosis not present

## 2020-10-06 DIAGNOSIS — G893 Neoplasm related pain (acute) (chronic): Secondary | ICD-10-CM | POA: Insufficient documentation

## 2020-10-06 DIAGNOSIS — K4020 Bilateral inguinal hernia, without obstruction or gangrene, not specified as recurrent: Secondary | ICD-10-CM | POA: Diagnosis not present

## 2020-10-06 DIAGNOSIS — N200 Calculus of kidney: Secondary | ICD-10-CM | POA: Insufficient documentation

## 2020-10-06 DIAGNOSIS — C099 Malignant neoplasm of tonsil, unspecified: Secondary | ICD-10-CM | POA: Diagnosis present

## 2020-10-06 MED ORDER — IOHEXOL 300 MG/ML  SOLN
100.0000 mL | Freq: Once | INTRAMUSCULAR | Status: AC | PRN
  Administered 2020-10-06: 100 mL via INTRAVENOUS

## 2020-10-06 NOTE — Progress Notes (Signed)
tum

## 2020-10-08 ENCOUNTER — Other Ambulatory Visit: Payer: Self-pay | Admitting: *Deleted

## 2020-10-08 ENCOUNTER — Other Ambulatory Visit: Payer: Self-pay | Admitting: Oncology

## 2020-10-08 ENCOUNTER — Telehealth: Payer: Self-pay | Admitting: *Deleted

## 2020-10-08 DIAGNOSIS — C099 Malignant neoplasm of tonsil, unspecified: Secondary | ICD-10-CM

## 2020-10-08 MED ORDER — OXYCODONE HCL 5 MG PO TABS: 5.0000 mg | ORAL_TABLET | ORAL | 0 refills | Status: DC | PRN

## 2020-10-08 NOTE — Telephone Encounter (Signed)
Called pt and left message that pain med was sent in at 1:30 today. Should be ready to pick up at pharmacy

## 2020-10-08 NOTE — Telephone Encounter (Signed)
Patient called stating that prescription was to have been sent in for him and it has not been sent. Please advise

## 2020-10-08 NOTE — Telephone Encounter (Signed)
Error, pain med was already refilled today

## 2020-10-10 NOTE — Progress Notes (Signed)
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 Richardson Landry for symptoms of sore throat and difficulty swallowing which she has been experiencing over the last 1 year.He underwent a comprehensive auto laryngoscopy exam which showed a firm enlarged right tonsillar mass with necrotic area extending onto the adjacent soft palate into the root of the uvula. Hypopharynx and larynx as well as nasopharynx was unable to be visualized due to gag reflex. This was biopsied and was consistent with nonkeratinizing invasive squamous cell carcinoma positive for p16.PDL 1 70%  PET CT scan showed large hypermetabolic right palatine tonsil mass which crosses the midline. Ipsilateral cervical nodal metastases. Bilateral pulmonary nodules including a dominant 1 cm hypermetabolic right lung base nodule. Multiplicity favor pulmonary metastases. Subcarinal and equivocal left hilar nodal metastases. Hypermetabolism involving the ascending colon. Question mild diverticulitis versus underlying colonic mass or polyp cannot be excluded  Patient underwent EBUS guided biopsy ofhilarlymph nodes. Biopsy showed squamous cell carcinoma. However HPV/p16 status was uninterpretable.  Patient completed 6 cycles of carbotaxol chemotherapy along with Keytruda and is currently on maintenance Keytruda. Scans show far for excellent  response to treatment   Interval history-patient is currently doing well and denies any significant complaints at this time.  He uses occasional oxycodone for throat pain and back pain.  ECOG PS- 1 Pain scale- 0   Review of systems- Review of Systems  Constitutional: Negative for chills, fever, malaise/fatigue and weight loss.  HENT: Negative for congestion, ear discharge and nosebleeds.   Eyes: Negative for blurred vision.  Respiratory: Negative for cough, hemoptysis, sputum production, shortness of breath and wheezing.   Cardiovascular: Negative for chest pain, palpitations, orthopnea and claudication.  Gastrointestinal: Negative for abdominal pain, blood in stool, constipation, diarrhea, heartburn, melena, nausea and vomiting.  Genitourinary: Negative for dysuria, flank pain, frequency, hematuria and urgency.  Musculoskeletal: Negative for back pain, joint pain and myalgias.  Skin: Negative for rash.  Neurological: Negative for dizziness, tingling, focal weakness, seizures, weakness and headaches.  Endo/Heme/Allergies: Does not bruise/bleed easily.  Psychiatric/Behavioral: Negative for depression and suicidal ideas. The patient does not have insomnia.       No Known Allergies   Past Medical History:  Diagnosis Date  . Asthma   . Lung nodules   . Metastasis to lung (HCC)    tonsil cancer and mets to lung  . Tonsil cancer ([place])   . Tonsil cancer [facility])      Past Surgical History:  Procedure Laterality Date  . CERVICAL SPINE SURGERY    . LUMBAR SPINE SURGERY    . PORTA CATH INSERTION N/A 10/08/2019   Procedure: PORTA CATH INSERTION;  Surgeon: Katha Cabal, MD;  Location: [place] [facility];  Service: Cardiovascular;  Laterality: N/A;  . VIDEO BRONCHOSCOPY WITH ENDOBRONCHIAL NAVIGATION N/A 10/10/2019   Procedure: VIDEO BRONCHOSCOPY WITH ENDOBRONCHIAL NAVIGATION;  Surgeon: Tyler Pita, MD;  Location: [facility];  Service: Pulmonary;  Laterality: N/A;  .  VIDEO  BRONCHOSCOPY WITH ENDOBRONCHIAL ULTRASOUND N/A 10/10/2019   Procedure: VIDEO BRONCHOSCOPY WITH ENDOBRONCHIAL ULTRASOUND;  Surgeon: Tyler Pita, MD;  Location: [facility];  Service: Pulmonary;  Laterality: N/A;    Social History   Socioeconomic History  . Marital status: Widowed    Spouse name: Not on file  . Number of children: Not on file  . Years of education: Not on file  . Highest education level: Not on file  Occupational History  . Not on file  Tobacco Use  . Smoking status: Never Smoker  . Smokeless tobacco: Never Used  Vaping Use  . Vaping Use: Never used  Substance and Sexual Activity  . Alcohol use: Never  . Drug use: Never  . Sexual activity: Not Currently  Other Topics Concern  . Not on file  Social History Narrative  . Not on file   Social Determinants of Health   Financial Resource Strain: Not on file  Food Insecurity: Not on file  Transportation Needs: Not on file  Physical Activity: Not on file  Stress: Not on file  Social Connections: Not on file  Intimate Partner Violence: Not on file    Family History  Problem Relation Age of Onset  . Cancer Mother 95       not sure what type  . Prostate cancer Father   . Rheum arthritis Father   . Healthy Sister   . Healthy Brother   . Healthy Sister   . Healthy Brother   . Healthy Brother   . Heart disease Brother   . Heart disease Brother      Current Outpatient Medications:  .  acetaminophen (TYLENOL) 500 MG tablet, Take 500-1,000 mg by mouth every 8 (eight) hours as needed (for pain.). , Disp: , Rfl:  .  albuterol (VENTOLIN HFA) 108 (90 Base) MCG/ACT inhaler, Inhale 2 puffs into the lungs every 6 (six) hours as needed (wheezing/shortness of breath)., Disp: , Rfl:  .  SYMBICORT 160-4.5 MCG/ACT inhaler, Inhale 2 puffs into the lungs 2 (two) times daily., Disp: 1 each, Rfl: 2 .  lidocaine-prilocaine (EMLA) cream, Apply to affected area once (Patient not taking: No sig reported), Disp: 30 g, Rfl: 3 .   LORazepam (ATIVAN) 0.5 MG tablet, Take 1 tablet (0.5 mg total) by mouth every 6 (six) hours as needed (Nausea or vomiting). (Patient not taking: No sig reported), Disp: 30 tablet, Rfl: 0 .  OLANZapine (ZYPREXA) 10 MG tablet, Take 1 tablet (10 mg total) by mouth at bedtime. (Patient not taking: No sig reported), Disp: 30 tablet, Rfl: 2 .  ondansetron (ZOFRAN) 8 MG tablet, Take 1 tablet (8 mg total) by mouth 2 (two) times daily as needed. Start on the third day after chemotherapy. (Patient not taking: No sig reported), Disp: 30 tablet, Rfl: 1 .  oxyCODONE (OXY IR/ROXICODONE) 5 MG immediate release tablet, Take 1 tablet (5 mg total) by mouth every 4 (four) hours as needed for severe pain., Disp: 60 tablet, Rfl: 0 .  prochlorperazine (COMPAZINE) 10 MG tablet, Take 1 tablet (10 mg total) by mouth every 6 (six) hours as needed (Nausea or vomiting). (Patient not taking: No sig reported), Disp: 30 tablet, Rfl: 1 No current facility-administered medications for this visit.  Facility-Administered Medications Ordered in Other Visits:  .  sodium chloride flush (NS) 0.9 % injection 10 mL, 10 mL, Intravenous, PRN, Sindy Guadeloupe, MD, 10 mL at 03/09/20 0919  Physical exam:  Vitals:   10/05/20 0958  BP: 119/68  Pulse: 67  Resp: 16  Temp: 98 F (36.7 C)  TempSrc: Oral  Weight: 196 lb 4.8 oz (89 kg)  Height: 5\' 10"  (1.778 m)   Physical Exam Constitutional:      General: He is not in acute distress. Eyes:     Extraocular Movements: EOM normal.     Pupils: Pupils are equal, round, and reactive to light.  Cardiovascular:     Rate and Rhythm: Normal rate and regular rhythm.     Heart sounds: Normal heart sounds.  Pulmonary:     Effort: Pulmonary effort is normal.     Breath sounds: Normal breath sounds.  Lymphadenopathy:     Comments: No palpable cervical adenopathy  Skin:    General: Skin is warm and dry.  Neurological:     Mental Status: He is alert and oriented to person, place, and time.       CMP Latest Ref Rng & Units 10/05/2020  Glucose 70 - 99 mg/dL 104(H)  BUN 8 - 23 mg/dL 18  Creatinine 0.61 - 1.24 mg/dL 0.82  Sodium 135 - 145 mmol/L 139  Potassium 3.5 - 5.1 mmol/L 3.8  Chloride 98 - 111 mmol/L 106  CO2 22 - 32 mmol/L 24  Calcium 8.9 - 10.3 mg/dL 8.8(L)  Total Protein 6.5 - 8.1 g/dL 7.0  Total Bilirubin 0.3 - 1.2 mg/dL 0.7  Alkaline Phos 38 - 126 U/L 78  AST 15 - 41 U/L 20  ALT 0 - 44 U/L 12   CBC Latest Ref Rng & Units 10/05/2020  WBC 4.0 - 10.5 K/uL 5.4  Hemoglobin 13.0 - 17.0 g/dL 12.7(L)  Hematocrit 39.0 - 52.0 % 37.3(L)  Platelets 150 - 400 K/uL 238    No images are attached to the encounter.  CT SOFT TISSUE NECK W CONTRAST  Result Date: 10/06/2020 CLINICAL DATA:  Tonsil carcinoma post chemotherapy, follow-up EXAM: CT NECK WITH CONTRAST TECHNIQUE: Multidetector CT imaging of the neck was performed using the standard protocol following the bolus administration of intravenous contrast. CONTRAST:  124mL OMNIPAQUE IOHEXOL 300 MG/ML  SOLN COMPARISON:  07/02/2020 FINDINGS: Pharynx and larynx: There is no evidence of recurrent right tonsillar mass. Stable appearance of left palatine tonsil with likely postinflammatory calcifications. Remainder of pharynx and larynx is unremarkable. Salivary glands: Unremarkable. Thyroid: Normal Lymph nodes: Asymmetric enlarged and nonenlarged primarily right level 2 lymph nodes. Increase in size of right level 2 node measuring up to 2 cm (previously 1.7 cm) on series 3, image 45. Decrease in size of right level 2 node measuring 8 mm on image 50 (previously 11 mm). Increase in size of more anterior node at the same level measuring 10 mm (previously 4 mm). Vascular: Major neck vessels are patent. Limited intracranial: No abnormal enhancement. Visualized orbits: Unremarkable. Mastoids and visualized paranasal sinuses: Patchy mucosal thickening. Mastoid air cells are clear. Skeleton: Degenerative changes of the cervical spine appears similar.  Upper chest: Refer to separately dictated CT chest. Other: None. IMPRESSION: No evidence of local recurrence. Increased right level 2 adenopathy. Electronically Signed   By: Macy Mis M.D.   On: 10/06/2020 09:31   CT CHEST ABDOMEN PELVIS W CONTRAST  Result Date: 10/06/2020 CLINICAL DATA:  History of metastatic tonsillar cancer. Patient is status post chemotherapy and currently on immunotherapy with Keytruda. Restaging. EXAM: CT CHEST, ABDOMEN, AND PELVIS WITH CONTRAST TECHNIQUE: Multidetector CT imaging of the chest, abdomen and pelvis was performed following the standard protocol during bolus administration of intravenous contrast. CONTRAST:  137mL OMNIPAQUE  IOHEXOL 300 MG/ML  SOLN COMPARISON:  Chest CT 07/02/2020 FINDINGS: CT CHEST FINDINGS Cardiovascular: The heart is normal in size. No pericardial effusion. The aorta is normal in caliber. No dissection. Minimal stable atherosclerotic calcifications. Mediastinum/Nodes: No mediastinal or hilar mass or lymphadenopathy. The esophagus is unremarkable. The thyroid gland is unremarkable. Lungs/Pleura: No acute pulmonary findings. No worrisome pulmonary lesions. No pulmonary nodules to suggest pulmonary metastatic disease. The previous report mention is 3 mm nodule in the left upper lobe but this is not evident on this study. Streaky subpleural bibasilar atelectasis is noted. Musculoskeletal: No significant bony findings. CT ABDOMEN PELVIS FINDINGS Hepatobiliary: No hepatic lesions to suggest metastatic disease. No intrahepatic biliary dilatation. The gallbladder appears normal. No common bile duct dilatation. Pancreas: No mass, inflammation or ductal dilatation. Spleen: Normal size.  No focal lesions. Adrenals/Urinary Tract: The adrenal glands and kidneys are unremarkable. Stable right renal cyst and bilateral renal calculi. The bladder is unremarkable. Again demonstrated is a left inguinal hernia containing part of the bladder. Stomach/Bowel: The stomach,  duodenum, small bowel and colon are unremarkable and stable. Terminal ileum is normal. The appendix is normal. Stable colonic diverticulosis without findings for acute diverticulitis. Vascular/Lymphatic: The aorta is normal in caliber. No dissection. The branch vessels are patent. The major venous structures are patent. No mesenteric or retroperitoneal mass or adenopathy. Small scattered lymph nodes are noted. Reproductive: The prostate gland and seminal vesicles are unremarkable. Other: Bilateral inguinal hernias left larger than right. The left inguinal hernia contains part of the bladder. Musculoskeletal: No significant bony findings. No findings suspicious for osseous metastatic disease. IMPRESSION: 1. No CT findings for metastatic disease involving the chest, abdomen or pelvis. 2. Stable bilateral renal calculi and right renal cyst. 3. Bilateral inguinal hernias left larger than right. The left inguinal hernia contains part of the bladder. Electronically Signed   By: Marijo Sanes M.D.   On: 10/06/2020 12:19     Assessment and plan- Patient is a 70 y.o. 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  We are getting his repeat scans In 1 to 2 days.  If there is continued increase in the size of his neck nodes I will present his case at tumor board to see if there would be any role for palliative radiation to this area versus neck dissection   Visit Diagnosis 1. Encounter for antineoplastic immunotherapy   2. 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 Randa Evens, MD, MPH [facility] [phone] 10/10/2020 5:21 PM

## 2020-10-14 ENCOUNTER — Other Ambulatory Visit: Payer: Medicare Other

## 2020-10-14 NOTE — Progress Notes (Signed)
Tumor Board Documentation  Patrick Mendoza. was presented by Dr Janese Banks at our Tumor Board on 10/14/2020, which included representatives from medical oncology,radiation oncology,internal medicine,navigation,pathology,surgical,radiology,pharmacy,genetics,research,palliative care.  Patrick Mendoza currently presents as a current patient,for discussion with history of the following treatments: immunotherapy,adjuvant chemotherapy.  Additionally, we reviewed previous medical and familial history, history of present illness, and recent lab results along with all available histopathologic and imaging studies. The tumor board considered available treatment options and made the following recommendations: Active surveillance    The following procedures/referrals were also placed: No orders of the defined types were placed in this encounter.   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  Tumor board is a meeting of clinicians from various specialty areas who evaluate and discuss patients for whom a multidisciplinary approach is being considered. Final determinations in the plan of care are those of the provider(s). The responsibility for follow up of recommendations given during tumor board is that of the provider.   Today's extended care, comprehensive team conference, Patrick Mendoza was not present for the discussion and was not examined.   Multidisciplinary Tumor Board is a multidisciplinary case peer review process.  Decisions discussed in the Multidisciplinary Tumor Board reflect the opinions of the specialists present at the conference without having examined the patient.  Ultimately, treatment and diagnostic decisions rest with the primary provider(s) and the patient.

## 2020-10-26 ENCOUNTER — Inpatient Hospital Stay: Payer: Medicare Other

## 2020-10-26 ENCOUNTER — Inpatient Hospital Stay: Payer: Medicare Other | Attending: Oncology

## 2020-10-26 ENCOUNTER — Other Ambulatory Visit: Payer: Self-pay

## 2020-10-26 ENCOUNTER — Inpatient Hospital Stay (HOSPITAL_BASED_OUTPATIENT_CLINIC_OR_DEPARTMENT_OTHER): Payer: Medicare Other | Admitting: Oncology

## 2020-10-26 ENCOUNTER — Encounter: Payer: Self-pay | Admitting: Oncology

## 2020-10-26 VITALS — BP 115/76 | HR 73 | Temp 98.1°F | Resp 20 | Wt 193.2 lb

## 2020-10-26 DIAGNOSIS — Z5112 Encounter for antineoplastic immunotherapy: Secondary | ICD-10-CM

## 2020-10-26 DIAGNOSIS — C099 Malignant neoplasm of tonsil, unspecified: Secondary | ICD-10-CM | POA: Insufficient documentation

## 2020-10-26 DIAGNOSIS — Z8249 Family history of ischemic heart disease and other diseases of the circulatory system: Secondary | ICD-10-CM | POA: Insufficient documentation

## 2020-10-26 DIAGNOSIS — J45909 Unspecified asthma, uncomplicated: Secondary | ICD-10-CM | POA: Insufficient documentation

## 2020-10-26 DIAGNOSIS — Z79899 Other long term (current) drug therapy: Secondary | ICD-10-CM | POA: Diagnosis not present

## 2020-10-26 DIAGNOSIS — Z8042 Family history of malignant neoplasm of prostate: Secondary | ICD-10-CM | POA: Diagnosis not present

## 2020-10-26 DIAGNOSIS — G893 Neoplasm related pain (acute) (chronic): Secondary | ICD-10-CM | POA: Insufficient documentation

## 2020-10-26 DIAGNOSIS — R682 Dry mouth, unspecified: Secondary | ICD-10-CM | POA: Diagnosis not present

## 2020-10-26 DIAGNOSIS — Z7951 Long term (current) use of inhaled steroids: Secondary | ICD-10-CM | POA: Insufficient documentation

## 2020-10-26 DIAGNOSIS — Z809 Family history of malignant neoplasm, unspecified: Secondary | ICD-10-CM | POA: Insufficient documentation

## 2020-10-26 DIAGNOSIS — C778 Secondary and unspecified malignant neoplasm of lymph nodes of multiple regions: Secondary | ICD-10-CM | POA: Diagnosis not present

## 2020-10-26 DIAGNOSIS — C7800 Secondary malignant neoplasm of unspecified lung: Secondary | ICD-10-CM | POA: Insufficient documentation

## 2020-10-26 LAB — COMPREHENSIVE METABOLIC PANEL
ALT: 10 U/L (ref 0–44)
AST: 17 U/L (ref 15–41)
Albumin: 3.9 g/dL (ref 3.5–5.0)
Alkaline Phosphatase: 84 U/L (ref 38–126)
Anion gap: 12 (ref 5–15)
BUN: 14 mg/dL (ref 8–23)
CO2: 24 mmol/L (ref 22–32)
Calcium: 9 mg/dL (ref 8.9–10.3)
Chloride: 104 mmol/L (ref 98–111)
Creatinine, Ser: 0.86 mg/dL (ref 0.61–1.24)
GFR, Estimated: >60 mL/min (ref >60)
Glucose, Bld: 105 mg/dL — ABNORMAL HIGH (ref 70–99)
Potassium: 3.9 mmol/L (ref 3.5–5.1)
Sodium: 140 mmol/L (ref 135–145)
Total Bilirubin: 0.5 mg/dL (ref 0.3–1.2)
Total Protein: 7.1 g/dL (ref 6.5–8.1)

## 2020-10-26 LAB — CBC WITH DIFFERENTIAL/PLATELET
Abs Immature Granulocytes: 0.02 10*3/uL (ref 0.00–0.07)
Basophils Absolute: 0 10*3/uL (ref 0.0–0.1)
Basophils Relative: 0 %
Eosinophils Absolute: 0.3 10*3/uL (ref 0.0–0.5)
Eosinophils Relative: 3 %
HCT: 37.8 % — ABNORMAL LOW (ref 39.0–52.0)
Hemoglobin: 12.7 g/dL — ABNORMAL LOW (ref 13.0–17.0)
Immature Granulocytes: 0 %
Lymphocytes Relative: 12 %
Lymphs Abs: 1.2 10*3/uL (ref 0.7–4.0)
MCH: 32.2 pg (ref 26.0–34.0)
MCHC: 33.6 g/dL (ref 30.0–36.0)
MCV: 95.9 fL (ref 80.0–100.0)
Monocytes Absolute: 0.7 10*3/uL (ref 0.1–1.0)
Monocytes Relative: 7 %
Neutro Abs: 7.3 10*3/uL (ref 1.7–7.7)
Neutrophils Relative %: 78 %
Platelets: 275 10*3/uL (ref 150–400)
RBC: 3.94 MIL/uL — ABNORMAL LOW (ref 4.22–5.81)
RDW: 13 % (ref 11.5–15.5)
WBC: 9.5 10*3/uL (ref 4.0–10.5)
nRBC: 0 % (ref 0.0–0.2)

## 2020-10-26 MED ORDER — SYMBICORT 160-4.5 MCG/ACT IN AERO: 2.0000 | INHALATION_SPRAY | Freq: Two times a day (BID) | RESPIRATORY_TRACT | 2 refills | Status: DC

## 2020-10-26 MED ORDER — PEMBROLIZUMAB CHEMO INJECTION 100 MG/4ML
200.0000 mg | Freq: Once | INTRAVENOUS | Status: AC
  Administered 2020-10-26: 200 mg via INTRAVENOUS
  Filled 2020-10-26: qty 8

## 2020-10-26 MED ORDER — SODIUM CHLORIDE 0.9% FLUSH
10.0000 mL | Freq: Once | INTRAVENOUS | Status: AC
  Administered 2020-10-26: 10 mL via INTRAVENOUS
  Filled 2020-10-26: qty 10

## 2020-10-26 MED ORDER — PROCHLORPERAZINE EDISYLATE 10 MG/2ML IJ SOLN
10.0000 mg | Freq: Once | INTRAMUSCULAR | Status: AC
  Administered 2020-10-26: 10 mg via INTRAVENOUS
  Filled 2020-10-26: qty 2

## 2020-10-26 MED ORDER — SODIUM CHLORIDE 0.9 % IV SOLN
Freq: Once | INTRAVENOUS | Status: AC
  Filled 2020-10-26: qty 250

## 2020-10-26 MED ORDER — HEPARIN SOD (PORK) LOCK FLUSH 100 UNIT/ML IV SOLN
INTRAVENOUS | Status: AC
  Filled 2020-10-26: qty 5

## 2020-10-26 MED ORDER — HEPARIN SOD (PORK) LOCK FLUSH 100 UNIT/ML IV SOLN
500.0000 [IU] | Freq: Once | INTRAVENOUS | Status: AC | PRN
  Administered 2020-10-26: 500 [IU]
  Filled 2020-10-26: qty 5

## 2020-10-26 NOTE — Progress Notes (Signed)
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 Richardson Landry for symptoms of sore throat and difficulty swallowing which she has been experiencing over the last 1 year.He underwent a comprehensive auto laryngoscopy exam which showed a firm enlarged right tonsillar mass with necrotic area extending onto the adjacent soft palate into the root of the uvula. Hypopharynx and larynx as well as nasopharynx was unable to be visualized due to gag reflex. This was biopsied and was consistent with nonkeratinizing invasive squamous cell carcinoma positive for p16.PDL 1 70%  PET CT scan showed large hypermetabolic right palatine tonsil mass which crosses the midline. Ipsilateral cervical nodal metastases. Bilateral pulmonary nodules including a dominant 1 cm hypermetabolic right lung base nodule. Multiplicity favor pulmonary metastases. Subcarinal and equivocal left hilar nodal metastases. Hypermetabolism involving the ascending colon. Question mild diverticulitis versus underlying colonic mass or polyp cannot be excluded  Patient underwent EBUS guided biopsy ofhilarlymph nodes. Biopsy showed squamous cell carcinoma. However HPV/p16 status was uninterpretable.  Patient completed 6 cycles of carbotaxol chemotherapy along with Keytruda and is currently on maintenance Keytruda. Scans show far for excellent  response to treatment  Interval history-patient patient reports ongoing symptoms of dry mouth. Has occasional pain during swallowing for which he uses as needed oxycodone. Denies other complaints at this time  ECOG PS- 1 Pain scale- 0 Opioid associated constipation- no  Review of systems- Review of Systems  Constitutional: Negative for chills, fever, malaise/fatigue and weight loss.  HENT: Negative for congestion, ear discharge and nosebleeds.        Dry mouth  Eyes: Negative for blurred vision.  Respiratory: Negative for cough, hemoptysis, sputum production, shortness of breath and wheezing.   Cardiovascular: Negative for chest pain, palpitations, orthopnea and claudication.  Gastrointestinal: Negative for abdominal pain, blood in stool, constipation, diarrhea, heartburn, melena, nausea and vomiting.  Genitourinary: Negative for dysuria, flank pain, frequency, hematuria and urgency.  Musculoskeletal: Negative for back pain, joint pain and myalgias.  Skin: Negative for rash.  Neurological: Negative for dizziness, tingling, focal weakness, seizures, weakness and headaches.  Endo/Heme/Allergies: Does not bruise/bleed easily.  Psychiatric/Behavioral: Negative for depression and suicidal ideas. The patient does not have insomnia.       No Known Allergies   Past Medical History:  Diagnosis Date  . Asthma   . Lung nodules   . Metastasis to lung (HCC)    tonsil cancer and mets to lung  . Tonsil cancer ([place])   . Tonsil cancer [facility])      Past Surgical History:  Procedure Laterality Date  . CERVICAL SPINE SURGERY    . LUMBAR SPINE SURGERY    . PORTA CATH INSERTION N/A 10/08/2019   Procedure: PORTA CATH INSERTION;  Surgeon: Katha Cabal, MD;  Location: [place] [facility];  Service: Cardiovascular;  Laterality: N/A;  . VIDEO BRONCHOSCOPY WITH ENDOBRONCHIAL NAVIGATION N/A 10/10/2019   Procedure: VIDEO BRONCHOSCOPY WITH ENDOBRONCHIAL NAVIGATION;  Surgeon: Tyler Pita,  MD;  Location: [facility];  Service: Pulmonary;  Laterality: N/A;  . VIDEO BRONCHOSCOPY WITH ENDOBRONCHIAL ULTRASOUND N/A 10/10/2019   Procedure: VIDEO BRONCHOSCOPY WITH ENDOBRONCHIAL ULTRASOUND;  Surgeon: Tyler Pita, MD;  Location: [facility];  Service: Pulmonary;  Laterality: N/A;    Social History   Socioeconomic History  . Marital status: Widowed    Spouse name: Not on file  . Number of children: Not on file  . Years of education: Not on file  . Highest education level: Not on file  Occupational History  . Not on file  Tobacco Use  . Smoking status: Never Smoker  . Smokeless tobacco: Never Used  Vaping Use  . Vaping Use: Never used  Substance and Sexual Activity  . Alcohol use: Never  . Drug use: Never  . Sexual activity: Not Currently  Other Topics Concern  . Not on file  Social History Narrative  . Not on file   Social Determinants of Health   Financial Resource Strain: Not on file  Food Insecurity: Not on file  Transportation Needs: Not on file  Physical Activity: Not on file  Stress: Not on file  Social Connections: Not on file  Intimate Partner Violence: Not on file    Family History  Problem Relation Age of Onset  . Cancer Mother 4       not sure what type  . Prostate cancer Father   . Rheum arthritis Father   . Healthy Sister   . Healthy Brother   . Healthy Sister   . Healthy Brother   . Healthy Brother   . Heart disease Brother   . Heart disease Brother      Current Outpatient Medications:  .  acetaminophen (TYLENOL) 500 MG tablet, Take 500-1,000 mg by mouth every 8 (eight) hours as needed (for pain.). , Disp: , Rfl:  .  albuterol (VENTOLIN HFA) 108 (90 Base) MCG/ACT inhaler, Inhale 2 puffs into the lungs every 6 (six) hours as needed (wheezing/shortness of breath)., Disp: , Rfl:  .  oxyCODONE (OXY IR/ROXICODONE) 5 MG immediate release tablet, Take 1 tablet (5 mg total) by mouth every 4 (four) hours as needed for severe pain., Disp: 60  tablet, Rfl: 0 .  lidocaine-prilocaine (EMLA) cream, Apply to affected area once (Patient not taking: Reported on 10/26/2020), Disp: 30 g, Rfl: 3 .  LORazepam (ATIVAN) 0.5 MG tablet, Take 1 tablet (0.5 mg total) by mouth every 6 (six) hours as needed (Nausea or vomiting). (Patient not taking: No sig reported), Disp: 30 tablet, Rfl: 0 .  OLANZapine (ZYPREXA) 10 MG tablet, Take 1 tablet (10 mg total) by mouth at bedtime. (Patient not taking: No sig reported), Disp: 30 tablet, Rfl: 2 .  ondansetron (ZOFRAN) 8 MG tablet, Take 1 tablet (8 mg total) by mouth 2 (two) times daily as needed. Start on the third day after chemotherapy. (Patient not taking: No sig reported), Disp: 30 tablet, Rfl: 1 .  prochlorperazine (COMPAZINE) 10 MG tablet, Take 1 tablet (10 mg total) by mouth every 6 (six) hours as needed (Nausea or vomiting). (Patient not taking: No sig reported), Disp: 30 tablet, Rfl: 1 .  SYMBICORT 160-4.5 MCG/ACT inhaler, Inhale 2 puffs into the lungs 2 (two) times daily., Disp: 1 each, Rfl: 2 No current facility-administered medications for this visit.  Facility-Administered Medications Ordered in Other Visits:  .  sodium chloride flush (NS) 0.9 % injection 10 mL, 10 mL, Intravenous, PRN, Sindy Guadeloupe, MD, 10 mL at 03/09/20 [phone]  Physical exam:  Vitals:   10/26/20 0952  BP: 115/76  Pulse: 73  Resp: 20  Temp: 98.1 F (36.7 C)  TempSrc: Tympanic  SpO2: 100%  Weight: 193 lb 3.2 oz (87.6 kg)   Physical Exam Constitutional:      General: He is not in acute distress. Eyes:     Extraocular Movements: EOM normal.  Cardiovascular:     Rate and Rhythm: Normal rate and regular rhythm.     Heart sounds: Normal heart sounds.  Pulmonary:     Effort: Pulmonary effort is normal.     Breath sounds: Normal breath sounds.  Abdominal:     General: Bowel sounds are normal.     Palpations: Abdomen is soft.  Skin:    General: Skin is warm and dry.  Neurological:     Mental Status: He is alert and  oriented to person, place, and time.      CMP Latest Ref Rng & Units 10/05/2020  Glucose 70 - 99 mg/dL 104(H)  BUN 8 - 23 mg/dL 18  Creatinine 0.61 - 1.24 mg/dL 0.82  Sodium 135 - 145 mmol/L 139  Potassium 3.5 - 5.1 mmol/L 3.8  Chloride 98 - 111 mmol/L 106  CO2 22 - 32 mmol/L 24  Calcium 8.9 - 10.3 mg/dL 8.8(L)  Total Protein 6.5 - 8.1 g/dL 7.0  Total Bilirubin 0.3 - 1.2 mg/dL 0.7  Alkaline Phos 38 - 126 U/L 78  AST 15 - 41 U/L 20  ALT 0 - 44 U/L 12   CBC Latest Ref Rng & Units 10/05/2020  WBC 4.0 - 10.5 K/uL 5.4  Hemoglobin 13.0 - 17.0 g/dL 12.7(L)  Hematocrit 39.0 - 52.0 % 37.3(L)  Platelets 150 - 400 K/uL 238    No images are attached to the encounter.  CT SOFT TISSUE NECK W CONTRAST  Result Date: 10/06/2020 CLINICAL DATA:  Tonsil carcinoma post chemotherapy, follow-up EXAM: CT NECK WITH CONTRAST TECHNIQUE: Multidetector CT imaging of the neck was performed using the standard protocol following the bolus administration of intravenous contrast. CONTRAST:  110mL OMNIPAQUE IOHEXOL 300 MG/ML  SOLN COMPARISON:  07/02/2020 FINDINGS: Pharynx and larynx: There is no evidence of recurrent right tonsillar mass. Stable appearance of left palatine tonsil with likely postinflammatory calcifications. Remainder of pharynx and larynx is unremarkable. Salivary glands: Unremarkable. Thyroid: Normal Lymph nodes: Asymmetric enlarged and nonenlarged primarily right level 2 lymph nodes. Increase in size of right level 2 node measuring up to 2 cm (previously 1.7 cm) on series 3, image 45. Decrease in size of right level 2 node measuring 8 mm on image 50 (previously 11 mm). Increase in size of more anterior node at the same level measuring 10 mm (previously 4 mm). Vascular: Major neck vessels are patent. Limited intracranial: No abnormal enhancement. Visualized orbits: Unremarkable. Mastoids and visualized paranasal sinuses: Patchy mucosal thickening. Mastoid air cells are clear. Skeleton: Degenerative changes  of the cervical spine appears similar. Upper chest: Refer to separately dictated CT chest. Other: None. IMPRESSION: No evidence of local recurrence. Increased right level 2 adenopathy. Electronically Signed   By: Macy Mis M.D.   On: 10/06/2020 09:31   CT CHEST ABDOMEN PELVIS W CONTRAST  Result Date: 10/06/2020 CLINICAL DATA:  History of metastatic tonsillar cancer. Patient is status post chemotherapy and currently on immunotherapy with Keytruda. Restaging. EXAM: CT CHEST, ABDOMEN, AND PELVIS WITH CONTRAST TECHNIQUE: Multidetector CT imaging of the chest, abdomen and pelvis was performed following the standard protocol during bolus administration of intravenous contrast.  CONTRAST:  134mL OMNIPAQUE IOHEXOL 300 MG/ML  SOLN COMPARISON:  Chest CT 07/02/2020 FINDINGS: CT CHEST FINDINGS Cardiovascular: The heart is normal in size. No pericardial effusion. The aorta is normal in caliber. No dissection. Minimal stable atherosclerotic calcifications. Mediastinum/Nodes: No mediastinal or hilar mass or lymphadenopathy. The esophagus is unremarkable. The thyroid gland is unremarkable. Lungs/Pleura: No acute pulmonary findings. No worrisome pulmonary lesions. No pulmonary nodules to suggest pulmonary metastatic disease. The previous report mention is 3 mm nodule in the left upper lobe but this is not evident on this study. Streaky subpleural bibasilar atelectasis is noted. Musculoskeletal: No significant bony findings. CT ABDOMEN PELVIS FINDINGS Hepatobiliary: No hepatic lesions to suggest metastatic disease. No intrahepatic biliary dilatation. The gallbladder appears normal. No common bile duct dilatation. Pancreas: No mass, inflammation or ductal dilatation. Spleen: Normal size.  No focal lesions. Adrenals/Urinary Tract: The adrenal glands and kidneys are unremarkable. Stable right renal cyst and bilateral renal calculi. The bladder is unremarkable. Again demonstrated is a left inguinal hernia containing part of the  bladder. Stomach/Bowel: The stomach, duodenum, small bowel and colon are unremarkable and stable. Terminal ileum is normal. The appendix is normal. Stable colonic diverticulosis without findings for acute diverticulitis. Vascular/Lymphatic: The aorta is normal in caliber. No dissection. The branch vessels are patent. The major venous structures are patent. No mesenteric or retroperitoneal mass or adenopathy. Small scattered lymph nodes are noted. Reproductive: The prostate gland and seminal vesicles are unremarkable. Other: Bilateral inguinal hernias left larger than right. The left inguinal hernia contains part of the bladder. Musculoskeletal: No significant bony findings. No findings suspicious for osseous metastatic disease. IMPRESSION: 1. No CT findings for metastatic disease involving the chest, abdomen or pelvis. 2. Stable bilateral renal calculi and right renal cyst. 3. Bilateral inguinal hernias left larger than right. The left inguinal hernia contains part of the bladder. Electronically Signed   By: Marijo Sanes M.D.   On: 10/06/2020 12:19     Assessment and plan- Patient is a 70 y.o. 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 He is here for on treatment assessment prior to cycle 13 of palliative Keytruda  We have discussed patient's case at tumor board last week. There does not appear to be a significant increase in the size of the level 2 cervical lymph node. Other lymph nodes are overall stable to decrease. Plan is to continue maintenance Keytruda for at least 2 years before considering giving him a break from it.  Counts are okay to proceed with cycle 3 of palliative Keytruda today. I will see him back in 3 weeks for cycle 14  Dry mouth: I have asked him to switch his mouthwash from Biotene to an alternative brand which would be nonalcohol-based. Consider over-the-counter artificial saliva. Continue to  monitor   Visit Diagnosis 1. Encounter for antineoplastic immunotherapy   2. 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 Dry mouth      Dr. Randa Evens, MD, MPH [facility] at [facility] [phone] 10/26/2020 10:04 AM

## 2020-11-16 ENCOUNTER — Other Ambulatory Visit: Payer: Self-pay

## 2020-11-16 ENCOUNTER — Inpatient Hospital Stay: Payer: Medicare Other

## 2020-11-16 ENCOUNTER — Encounter: Payer: Self-pay | Admitting: Oncology

## 2020-11-16 ENCOUNTER — Inpatient Hospital Stay: Payer: Medicare Other | Admitting: Oncology

## 2020-11-16 VITALS — BP 115/79 | HR 70 | Temp 96.6°F | Resp 18 | Wt 192.4 lb

## 2020-11-16 DIAGNOSIS — C099 Malignant neoplasm of tonsil, unspecified: Secondary | ICD-10-CM

## 2020-11-16 DIAGNOSIS — Z5112 Encounter for antineoplastic immunotherapy: Secondary | ICD-10-CM

## 2020-11-16 LAB — COMPREHENSIVE METABOLIC PANEL
ALT: 12 U/L (ref 0–44)
AST: 21 U/L (ref 15–41)
Albumin: 4 g/dL (ref 3.5–5.0)
Alkaline Phosphatase: 80 U/L (ref 38–126)
Anion gap: 8 (ref 5–15)
BUN: 22 mg/dL (ref 8–23)
CO2: 25 mmol/L (ref 22–32)
Calcium: 8.9 mg/dL (ref 8.9–10.3)
Chloride: 104 mmol/L (ref 98–111)
Creatinine, Ser: 1.01 mg/dL (ref 0.61–1.24)
GFR, Estimated: >60 mL/min (ref >60)
Glucose, Bld: 105 mg/dL — ABNORMAL HIGH (ref 70–99)
Potassium: 3.7 mmol/L (ref 3.5–5.1)
Sodium: 137 mmol/L (ref 135–145)
Total Bilirubin: 0.7 mg/dL (ref 0.3–1.2)
Total Protein: 7.4 g/dL (ref 6.5–8.1)

## 2020-11-16 LAB — CBC WITH DIFFERENTIAL/PLATELET
Abs Immature Granulocytes: 0.02 10*3/uL (ref 0.00–0.07)
Basophils Absolute: 0 10*3/uL (ref 0.0–0.1)
Basophils Relative: 0 %
Eosinophils Absolute: 0.3 10*3/uL (ref 0.0–0.5)
Eosinophils Relative: 5 %
HCT: 37.2 % — ABNORMAL LOW (ref 39.0–52.0)
Hemoglobin: 12.5 g/dL — ABNORMAL LOW (ref 13.0–17.0)
Immature Granulocytes: 0 %
Lymphocytes Relative: 17 %
Lymphs Abs: 1.2 10*3/uL (ref 0.7–4.0)
MCH: 32.4 pg (ref 26.0–34.0)
MCHC: 33.6 g/dL (ref 30.0–36.0)
MCV: 96.4 fL (ref 80.0–100.0)
Monocytes Absolute: 0.7 10*3/uL (ref 0.1–1.0)
Monocytes Relative: 10 %
Neutro Abs: 4.6 10*3/uL (ref 1.7–7.7)
Neutrophils Relative %: 68 %
Platelets: 234 10*3/uL (ref 150–400)
RBC: 3.86 MIL/uL — ABNORMAL LOW (ref 4.22–5.81)
RDW: 12.9 % (ref 11.5–15.5)
WBC: 6.9 10*3/uL (ref 4.0–10.5)
nRBC: 0 % (ref 0.0–0.2)

## 2020-11-16 MED ORDER — PROCHLORPERAZINE EDISYLATE 10 MG/2ML IJ SOLN
10.0000 mg | Freq: Once | INTRAMUSCULAR | Status: AC
  Administered 2020-11-16: 10 mg via INTRAVENOUS
  Filled 2020-11-16: qty 2

## 2020-11-16 MED ORDER — HEPARIN SOD (PORK) LOCK FLUSH 100 UNIT/ML IV SOLN
500.0000 [IU] | Freq: Once | INTRAVENOUS | Status: AC
  Administered 2020-11-16: 500 [IU] via INTRAVENOUS
  Filled 2020-11-16: qty 5

## 2020-11-16 MED ORDER — OXYCODONE HCL 5 MG PO TABS: 5.0000 mg | ORAL_TABLET | ORAL | 0 refills | Status: DC | PRN

## 2020-11-16 MED ORDER — SODIUM CHLORIDE 0.9 % IV SOLN
Freq: Once | INTRAVENOUS | Status: AC
  Filled 2020-11-16: qty 250

## 2020-11-16 MED ORDER — SODIUM CHLORIDE 0.9% FLUSH
10.0000 mL | INTRAVENOUS | Status: DC | PRN
  Administered 2020-11-16: 10 mL via INTRAVENOUS
  Filled 2020-11-16: qty 10

## 2020-11-16 MED ORDER — SODIUM CHLORIDE 0.9 % IV SOLN
200.0000 mg | Freq: Once | INTRAVENOUS | Status: AC
  Administered 2020-11-16: 200 mg via INTRAVENOUS
  Filled 2020-11-16: qty 8

## 2020-11-16 MED ORDER — HEPARIN SOD (PORK) LOCK FLUSH 100 UNIT/ML IV SOLN
INTRAVENOUS | Status: AC
  Filled 2020-11-16: qty 5

## 2020-11-16 NOTE — Progress Notes (Signed)
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 Richardson Landry for symptoms of sore throat and difficulty swallowing which she has been experiencing over the last 1 year.He underwent a comprehensive auto laryngoscopy exam which showed a firm enlarged right tonsillar mass with necrotic area extending onto the adjacent soft palate into the root of the uvula. Hypopharynx and larynx as well as nasopharynx was unable to be visualized due to gag reflex. This was biopsied and was consistent with nonkeratinizing invasive squamous cell carcinoma positive for p16.PDL 1 70%  PET CT scan showed large hypermetabolic right palatine tonsil mass which crosses the midline. Ipsilateral cervical nodal metastases. Bilateral pulmonary nodules including a dominant 1 cm hypermetabolic right lung base nodule. Multiplicity favor pulmonary metastases. Subcarinal and equivocal left hilar nodal metastases. Hypermetabolism involving the ascending colon. Question mild diverticulitis versus underlying colonic mass or polyp cannot be excluded  Patient underwent EBUS guided biopsy ofhilarlymph nodes. Biopsy showed squamous cell carcinoma. However HPV/p16 status was uninterpretable.  Patient completed 6 cycles of carbotaxol chemotherapy along with Keytruda and is currently on maintenance Keytruda. Scans show far for excellent  response to treatment   Interval history-patient reports doing well denies any complaints at this time.  Has occasional pain during swallowing and chronic back pain for which he uses oxycodone  ECOG PS- 1 Pain scale- 2 Opioid associated constipation- no  Review of systems- Review of Systems  Constitutional: Negative for chills, fever, malaise/fatigue and weight loss.  HENT: Negative for congestion, ear discharge and nosebleeds.   Eyes: Negative for blurred vision.  Respiratory: Negative for cough, hemoptysis, sputum production, shortness of breath and wheezing.   Cardiovascular: Negative for chest pain, palpitations, orthopnea and claudication.  Gastrointestinal: Negative for abdominal pain, blood in stool, constipation, diarrhea, heartburn, melena, nausea and vomiting.  Genitourinary: Negative for dysuria, flank pain, frequency, hematuria and urgency.  Musculoskeletal: Negative for back pain, joint pain and myalgias.  Skin: Negative for rash.  Neurological: Negative for dizziness, tingling, focal weakness, seizures, weakness and headaches.  Endo/Heme/Allergies: Does not bruise/bleed easily.  Psychiatric/Behavioral: Negative for depression and suicidal ideas. The patient does not have insomnia.       No Known Allergies   Past Medical History:  Diagnosis Date  . Asthma   . Lung nodules   . Metastasis to lung (HCC)    tonsil cancer and mets to lung  . Tonsil cancer ([place])   . Tonsil cancer [facility])      Past Surgical History:  Procedure Laterality Date  . CERVICAL SPINE SURGERY    . LUMBAR SPINE SURGERY    . PORTA CATH INSERTION N/A 10/08/2019   Procedure: PORTA CATH INSERTION;  Surgeon: Katha Cabal, MD;  Location: [place] [facility];  Service: Cardiovascular;  Laterality: N/A;  . VIDEO BRONCHOSCOPY WITH ENDOBRONCHIAL NAVIGATION N/A 10/10/2019   Procedure: VIDEO BRONCHOSCOPY WITH ENDOBRONCHIAL NAVIGATION;  Surgeon: Tyler Pita, MD;  Location: [facility];  Service:  Pulmonary;  Laterality: N/A;  . VIDEO BRONCHOSCOPY WITH ENDOBRONCHIAL ULTRASOUND N/A 10/10/2019   Procedure: VIDEO BRONCHOSCOPY WITH ENDOBRONCHIAL ULTRASOUND;  Surgeon: Tyler Pita, MD;  Location: [facility];  Service: Pulmonary;  Laterality: N/A;    Social History   Socioeconomic History  . Marital status: Widowed    Spouse name: Not on file  . Number of children: Not on file  . Years of education: Not on file  . Highest education level: Not on file  Occupational History  . Not on file  Tobacco Use  . Smoking status: Never Smoker  . Smokeless tobacco: Never Used  Vaping Use  . Vaping Use: Never used  Substance and Sexual Activity  . Alcohol use: Never  . Drug use: Never  . Sexual activity: Not Currently  Other Topics Concern  . Not on file  Social History Narrative  . Not on file   Social Determinants of Health   Financial Resource Strain: Not on file  Food Insecurity: Not on file  Transportation Needs: Not on file  Physical Activity: Not on file  Stress: Not on file  Social Connections: Not on file  Intimate Partner Violence: Not on file    Family History  Problem Relation Age of Onset  . Cancer Mother 48       not sure what type  . Prostate cancer Father   . Rheum arthritis Father   . Healthy Sister   . Healthy Brother   . Healthy Sister   . Healthy Brother   . Healthy Brother   . Heart disease Brother   . Heart disease Brother      Current Outpatient Medications:  .  acetaminophen (TYLENOL) 500 MG tablet, Take 500-1,000 mg by mouth every 8 (eight) hours as needed (for pain.). , Disp: , Rfl:  .  albuterol (VENTOLIN HFA) 108 (90 Base) MCG/ACT inhaler, Inhale 2 puffs into the lungs every 6 (six) hours as needed (wheezing/shortness of breath)., Disp: , Rfl:  .  SYMBICORT 160-4.5 MCG/ACT inhaler, Inhale 2 puffs into the lungs 2 (two) times daily., Disp: 1 each, Rfl: 2 .  lidocaine-prilocaine (EMLA) cream, Apply to affected area once (Patient not taking:  No sig reported), Disp: 30 g, Rfl: 3 .  LORazepam (ATIVAN) 0.5 MG tablet, Take 1 tablet (0.5 mg total) by mouth every 6 (six) hours as needed (Nausea or vomiting). (Patient not taking: No sig reported), Disp: 30 tablet, Rfl: 0 .  OLANZapine (ZYPREXA) 10 MG tablet, Take 1 tablet (10 mg total) by mouth at bedtime. (Patient not taking: No sig reported), Disp: 30 tablet, Rfl: 2 .  ondansetron (ZOFRAN) 8 MG tablet, Take 1 tablet (8 mg total) by mouth 2 (two) times daily as needed. Start on the third day after chemotherapy. (Patient not taking: No sig reported), Disp: 30 tablet, Rfl: 1 .  oxyCODONE (OXY IR/ROXICODONE) 5 MG immediate release tablet, Take 1 tablet (5 mg total) by mouth every 4 (four) hours as needed for severe pain., Disp: 60 tablet, Rfl: 0 .  prochlorperazine (COMPAZINE) 10 MG tablet, Take 1 tablet (10 mg total) by mouth every 6 (six) hours as needed (Nausea or vomiting). (Patient not taking: No sig reported), Disp: 30 tablet, Rfl: 1 No current facility-administered medications for this visit.  Facility-Administered Medications Ordered in Other Visits:  .  sodium chloride flush (NS) 0.9 % injection 10 mL, 10 mL, Intravenous, PRN, Sindy Guadeloupe, MD, 10 mL at 03/09/20 0919 .  sodium chloride flush (NS) 0.9 %  injection 10 mL, 10 mL, Intravenous, PRN, Sindy Guadeloupe, MD, 10 mL at 11/16/20 0813  Physical exam:  Vitals:   11/16/20 0846  BP: 115/79  Pulse: 70  Resp: 18  Temp: (!) 96.6 F (35.9 C)  TempSrc: Tympanic  SpO2: 100%  Weight: 192 lb 6.4 oz (87.3 kg)   Physical Exam Constitutional:      General: He is not in acute distress. Cardiovascular:     Rate and Rhythm: Normal rate and regular rhythm.     Heart sounds: Normal heart sounds.  Pulmonary:     Effort: Pulmonary effort is normal.     Breath sounds: Normal breath sounds.  Lymphadenopathy:     Comments: No palpable cervical adenopathy  Skin:    General: Skin is warm and dry.  Neurological:     Mental Status: He is  alert and oriented to person, place, and time.      CMP Latest Ref Rng & Units 11/16/2020  Glucose 70 - 99 mg/dL 105(H)  BUN 8 - 23 mg/dL 22  Creatinine 0.61 - 1.24 mg/dL 1.01  Sodium 135 - 145 mmol/L 137  Potassium 3.5 - 5.1 mmol/L 3.7  Chloride 98 - 111 mmol/L 104  CO2 22 - 32 mmol/L 25  Calcium 8.9 - 10.3 mg/dL 8.9  Total Protein 6.5 - 8.1 g/dL 7.4  Total Bilirubin 0.3 - 1.2 mg/dL 0.7  Alkaline Phos 38 - 126 U/L 80  AST 15 - 41 U/L 21  ALT 0 - 44 U/L 12   CBC Latest Ref Rng & Units 11/16/2020  WBC 4.0 - 10.5 K/uL 6.9  Hemoglobin 13.0 - 17.0 g/dL 12.5(L)  Hematocrit 39.0 - 52.0 % 37.2(L)  Platelets 150 - 400 K/uL 234     Assessment and plan- Patient is a 70 y.o. 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  He is here for on treatment assessment prior to cycle 14 of palliative Keytruda  Counts okay to proceed with cycle 14 of palliative Keytruda today.  I will see him back in 3 weeks with CBC with differential CMP and TSH for cycle 15.  Neoplasm related pain and chronic back pain: Continue as needed oxycodone   Visit Diagnosis 1. Encounter for antineoplastic immunotherapy   2. 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 Randa Evens, MD, MPH [facility] at [facility] [phone] 11/16/2020 12:47 PM

## 2020-11-16 NOTE — Progress Notes (Signed)
Pt in for follow up denies any concerns today.  Request refill on oxycodone.

## 2020-12-07 ENCOUNTER — Inpatient Hospital Stay: Payer: Medicare Other | Admitting: Oncology

## 2020-12-07 ENCOUNTER — Inpatient Hospital Stay: Payer: Medicare Other

## 2020-12-07 ENCOUNTER — Other Ambulatory Visit: Payer: Self-pay | Admitting: *Deleted

## 2020-12-07 ENCOUNTER — Inpatient Hospital Stay: Payer: Medicare Other | Attending: Oncology

## 2020-12-07 VITALS — BP 118/76 | HR 70 | Temp 97.7°F | Resp 20 | Wt 195.1 lb

## 2020-12-07 DIAGNOSIS — Z7951 Long term (current) use of inhaled steroids: Secondary | ICD-10-CM | POA: Diagnosis not present

## 2020-12-07 DIAGNOSIS — J45909 Unspecified asthma, uncomplicated: Secondary | ICD-10-CM | POA: Insufficient documentation

## 2020-12-07 DIAGNOSIS — Z8042 Family history of malignant neoplasm of prostate: Secondary | ICD-10-CM | POA: Insufficient documentation

## 2020-12-07 DIAGNOSIS — C099 Malignant neoplasm of tonsil, unspecified: Secondary | ICD-10-CM

## 2020-12-07 DIAGNOSIS — G893 Neoplasm related pain (acute) (chronic): Secondary | ICD-10-CM | POA: Diagnosis not present

## 2020-12-07 DIAGNOSIS — Z809 Family history of malignant neoplasm, unspecified: Secondary | ICD-10-CM | POA: Diagnosis not present

## 2020-12-07 DIAGNOSIS — Z79899 Other long term (current) drug therapy: Secondary | ICD-10-CM | POA: Insufficient documentation

## 2020-12-07 DIAGNOSIS — Z8249 Family history of ischemic heart disease and other diseases of the circulatory system: Secondary | ICD-10-CM | POA: Insufficient documentation

## 2020-12-07 DIAGNOSIS — Z5112 Encounter for antineoplastic immunotherapy: Secondary | ICD-10-CM | POA: Diagnosis present

## 2020-12-07 DIAGNOSIS — C778 Secondary and unspecified malignant neoplasm of lymph nodes of multiple regions: Secondary | ICD-10-CM | POA: Diagnosis not present

## 2020-12-07 DIAGNOSIS — C7800 Secondary malignant neoplasm of unspecified lung: Secondary | ICD-10-CM | POA: Insufficient documentation

## 2020-12-07 LAB — COMPREHENSIVE METABOLIC PANEL
ALT: 13 U/L (ref 0–44)
AST: 22 U/L (ref 15–41)
Albumin: 4.2 g/dL (ref 3.5–5.0)
Alkaline Phosphatase: 82 U/L (ref 38–126)
Anion gap: 10 (ref 5–15)
BUN: 20 mg/dL (ref 8–23)
CO2: 24 mmol/L (ref 22–32)
Calcium: 8.8 mg/dL — ABNORMAL LOW (ref 8.9–10.3)
Chloride: 106 mmol/L (ref 98–111)
Creatinine, Ser: 0.89 mg/dL (ref 0.61–1.24)
GFR, Estimated: >60 mL/min (ref >60)
Glucose, Bld: 104 mg/dL — ABNORMAL HIGH (ref 70–99)
Potassium: 4 mmol/L (ref 3.5–5.1)
Sodium: 140 mmol/L (ref 135–145)
Total Bilirubin: 0.4 mg/dL (ref 0.3–1.2)
Total Protein: 7.2 g/dL (ref 6.5–8.1)

## 2020-12-07 LAB — CBC WITH DIFFERENTIAL/PLATELET
Abs Immature Granulocytes: 0.01 10*3/uL (ref 0.00–0.07)
Basophils Absolute: 0 10*3/uL (ref 0.0–0.1)
Basophils Relative: 1 %
Eosinophils Absolute: 0.4 10*3/uL (ref 0.0–0.5)
Eosinophils Relative: 6 %
HCT: 37.9 % — ABNORMAL LOW (ref 39.0–52.0)
Hemoglobin: 12.8 g/dL — ABNORMAL LOW (ref 13.0–17.0)
Immature Granulocytes: 0 %
Lymphocytes Relative: 20 %
Lymphs Abs: 1.3 10*3/uL (ref 0.7–4.0)
MCH: 32.5 pg (ref 26.0–34.0)
MCHC: 33.8 g/dL (ref 30.0–36.0)
MCV: 96.2 fL (ref 80.0–100.0)
Monocytes Absolute: 0.7 10*3/uL (ref 0.1–1.0)
Monocytes Relative: 11 %
Neutro Abs: 4 10*3/uL (ref 1.7–7.7)
Neutrophils Relative %: 62 %
Platelets: 238 10*3/uL (ref 150–400)
RBC: 3.94 MIL/uL — ABNORMAL LOW (ref 4.22–5.81)
RDW: 13.3 % (ref 11.5–15.5)
WBC: 6.5 10*3/uL (ref 4.0–10.5)
nRBC: 0 % (ref 0.0–0.2)

## 2020-12-07 LAB — TSH: TSH: 6.056 u[IU]/mL — ABNORMAL HIGH (ref 0.350–4.500)

## 2020-12-07 MED ORDER — SODIUM CHLORIDE 0.9 % IV SOLN
200.0000 mg | Freq: Once | INTRAVENOUS | Status: AC
  Administered 2020-12-07: 200 mg via INTRAVENOUS
  Filled 2020-12-07: qty 8

## 2020-12-07 MED ORDER — PROCHLORPERAZINE EDISYLATE 10 MG/2ML IJ SOLN
10.0000 mg | Freq: Once | INTRAMUSCULAR | Status: AC
  Administered 2020-12-07: 10 mg via INTRAVENOUS
  Filled 2020-12-07: qty 2

## 2020-12-07 MED ORDER — SODIUM CHLORIDE 0.9 % IV SOLN
Freq: Once | INTRAVENOUS | Status: AC
  Filled 2020-12-07: qty 250

## 2020-12-07 MED ORDER — SODIUM CHLORIDE 0.9% FLUSH
10.0000 mL | Freq: Once | INTRAVENOUS | Status: AC
  Administered 2020-12-07: 10 mL via INTRAVENOUS
  Filled 2020-12-07: qty 10

## 2020-12-07 MED ORDER — HEPARIN SOD (PORK) LOCK FLUSH 100 UNIT/ML IV SOLN
500.0000 [IU] | Freq: Once | INTRAVENOUS | Status: AC | PRN
Start: 2020-12-07 — End: 2020-12-07
  Administered 2020-12-07: 500 [IU]
  Filled 2020-12-07: qty 5

## 2020-12-07 NOTE — Progress Notes (Signed)
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 Richardson Landry for symptoms of sore throat and difficulty swallowing which she has been experiencing over the last 1 year.He underwent a comprehensive auto laryngoscopy exam which showed a firm enlarged right tonsillar mass with necrotic area extending onto the adjacent soft palate into the root of the uvula. Hypopharynx and larynx as well as nasopharynx was unable to be visualized due to gag reflex. 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  PET CT scan showed large hypermetabolic right palatine tonsil mass which crosses the midline.  Ipsilateral cervical nodal metastases.  Bilateral pulmonary nodules including a dominant 1 cm hypermetabolic right lung base nodule.  Multiplicity favor pulmonary metastases.  Subcarinal and equivocal left hilar nodal metastases.  Hypermetabolism involving the ascending colon.  Question mild diverticulitis versus underlying colonic mass or polyp cannot be excluded   Interval history-patient reports doing well and denies any complaints at this time other than chronic back pain.  Appetite and weight is remained stable.  Denies any shortness of breath cough or diarrhea  ECOG PS- 1 Pain scale- 0 Opioid associated constipation-no  Review of systems- Review of Systems   Constitutional: Negative for chills, fever, malaise/fatigue and weight loss.  HENT: Negative for congestion, ear discharge and nosebleeds.   Eyes: Negative for blurred vision.  Respiratory: Negative for cough, hemoptysis, sputum production, shortness of breath and wheezing.   Cardiovascular: Negative for chest pain, palpitations, orthopnea and claudication.  Gastrointestinal: Negative for abdominal pain, blood in stool, constipation, diarrhea, heartburn, melena, nausea and vomiting.  Genitourinary: Negative for dysuria, flank pain, frequency, hematuria and urgency.  Musculoskeletal: Positive for back pain. Negative for joint pain and myalgias.  Skin: Negative for rash.  Neurological: Negative for dizziness, tingling, focal weakness, seizures, weakness and headaches.  Endo/Heme/Allergies: Does not bruise/bleed easily.  Psychiatric/Behavioral: Negative for depression and suicidal ideas. The patient does not have insomnia.       No Known Allergies   Past Medical History:  Diagnosis Date  . Asthma   . Lung nodules   . Metastasis to lung (HCC)    tonsil cancer and mets to lung  . Tonsil cancer ([place])   . Tonsil cancer [facility])      Past Surgical History:  Procedure Laterality Date  . CERVICAL SPINE SURGERY    . LUMBAR SPINE SURGERY    . PORTA CATH INSERTION N/A 10/08/2019   Procedure: PORTA CATH INSERTION;  Surgeon: Katha Cabal, MD;  Location: [place] [facility];  Service: Cardiovascular;  Laterality: N/A;  . VIDEO BRONCHOSCOPY WITH ENDOBRONCHIAL NAVIGATION N/A 10/10/2019   Procedure: VIDEO BRONCHOSCOPY WITH ENDOBRONCHIAL NAVIGATION;  Surgeon: Tyler Pita, MD;  Location: [facility];  Service: Pulmonary;  Laterality: N/A;  . VIDEO BRONCHOSCOPY WITH ENDOBRONCHIAL ULTRASOUND N/A 10/10/2019   Procedure: VIDEO BRONCHOSCOPY WITH ENDOBRONCHIAL ULTRASOUND;  Surgeon: Tyler Pita, MD;  Location: [facility];  Service: Pulmonary;  Laterality: N/A;    Social History    Socioeconomic History  . Marital status: Widowed    Spouse name: Not on file  . Number of children: Not on file  . Years of education: Not on file  . Highest education level: Not on file  Occupational History  . Not on file  Tobacco Use  . Smoking status: Never Smoker  . Smokeless tobacco: Never Used  Vaping Use  . Vaping Use: Never used  Substance and Sexual Activity  . Alcohol use: Never  . Drug use: Never  . Sexual activity: Not Currently  Other Topics Concern  . Not on file  Social History Narrative  . Not on file   Social Determinants of Health   Financial Resource Strain: Not on file  Food Insecurity: Not on file  Transportation Needs: Not on file  Physical Activity: Not on file  Stress: Not on file  Social Connections: Not on file  Intimate Partner Violence: Not on file    Family History  Problem Relation Age of Onset  . Cancer Mother 42       not sure what type  . Prostate cancer Father   . Rheum arthritis Father   . Healthy Sister   . Healthy Brother   . Healthy Sister   . Healthy Brother   . Healthy Brother   . Heart disease Brother   . Heart disease Brother      Current Outpatient Medications:  .  oxyCODONE (OXY IR/ROXICODONE) 5 MG immediate release tablet, Take 1 tablet (5 mg total) by mouth every 4 (four) hours as needed for severe pain., Disp: 60 tablet, Rfl: 0 .  SYMBICORT 160-4.5 MCG/ACT inhaler, Inhale 2 puffs into the lungs 2 (two) times daily., Disp: 1 each, Rfl: 2 .  acetaminophen (TYLENOL) 500 MG tablet, Take 500-1,000 mg by mouth every 8 (eight) hours as needed (for pain.). , Disp: , Rfl:  .  albuterol (VENTOLIN HFA) 108 (90 Base) MCG/ACT inhaler, Inhale 2 puffs into the lungs every 6 (six) hours as needed (wheezing/shortness of breath). (Patient not taking: Reported on 12/07/2020), Disp: , Rfl:  .  lidocaine-prilocaine (EMLA) cream, Apply to affected area once (Patient not taking: No sig reported), Disp: 30 g, Rfl: 3 .  LORazepam  (ATIVAN) 0.5 MG tablet, Take 1 tablet (0.5 mg total) by mouth every 6 (six) hours as needed (Nausea or vomiting). (Patient not taking: No sig reported), Disp: 30 tablet, Rfl: 0 .  OLANZapine (ZYPREXA) 10 MG tablet, Take 1 tablet (10 mg total) by mouth at bedtime. (Patient not taking: No sig reported), Disp: 30 tablet, Rfl: 2 .  ondansetron (ZOFRAN) 8 MG tablet, Take 1 tablet (8 mg total) by mouth 2 (two) times daily as needed. Start on the third day after chemotherapy. (Patient not taking: No sig reported), Disp: 30 tablet, Rfl: 1 .  prochlorperazine (COMPAZINE) 10 MG tablet, Take 1 tablet (10 mg total) by mouth every 6 (six) hours as needed (Nausea or vomiting). (Patient not taking: No sig reported), Disp: 30 tablet, Rfl: 1 No current facility-administered medications for this visit.  Facility-Administered Medications Ordered in Other Visits:  .  0.9 %  sodium chloride infusion, , Intravenous, Once, Sindy Guadeloupe, MD .  pembrolizumab [facility]) 200 mg in sodium chloride 0.9 % 50 mL chemo infusion, 200 mg, Intravenous, Once, Sindy Guadeloupe, MD .  prochlorperazine (COMPAZINE) injection 10 mg, 10 mg, Intravenous, Once, Sindy Guadeloupe, MD .  sodium chloride  flush (NS) 0.9 % injection 10 mL, 10 mL, Intravenous, PRN, Sindy Guadeloupe, MD, 10 mL at 03/09/20 0919 .  sodium chloride flush (NS) 0.9 % injection 10 mL, 10 mL, Intravenous, Once, Sindy Guadeloupe, MD  Physical exam:  Vitals:   12/07/20 0915  BP: 118/76  Pulse: 70  Resp: 20  Temp: 97.7 F (36.5 C)  TempSrc: Tympanic  SpO2: 99%  Weight: 195 lb 1.6 oz (88.5 kg)   Physical Exam Constitutional:      General: He is not in acute distress. Cardiovascular:     Rate and Rhythm: Normal rate and regular rhythm.     Heart sounds: Normal heart sounds.  Pulmonary:     Effort: Pulmonary effort is normal.     Breath sounds: Normal breath sounds.  Abdominal:     General: Bowel sounds are normal.     Palpations: Abdomen is soft.  Skin:     General: Skin is warm and dry.  Neurological:     Mental Status: He is alert and oriented to person, place, and time.      CMP Latest Ref Rng & Units 12/07/2020  Glucose 70 - 99 mg/dL 104(H)  BUN 8 - 23 mg/dL 20  Creatinine 0.61 - 1.24 mg/dL 0.89  Sodium 135 - 145 mmol/L 140  Potassium 3.5 - 5.1 mmol/L 4.0  Chloride 98 - 111 mmol/L 106  CO2 22 - 32 mmol/L 24  Calcium 8.9 - 10.3 mg/dL 8.8(L)  Total Protein 6.5 - 8.1 g/dL 7.2  Total Bilirubin 0.3 - 1.2 mg/dL 0.4  Alkaline Phos 38 - 126 U/L 82  AST 15 - 41 U/L 22  ALT 0 - 44 U/L 13   CBC Latest Ref Rng & Units 12/07/2020  WBC 4.0 - 10.5 K/uL 6.5  Hemoglobin 13.0 - 17.0 g/dL 12.8(L)  Hematocrit 39.0 - 52.0 % 37.9(L)  Platelets 150 - 400 K/uL 238      Assessment and plan- Patient is a 70 y.o. 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  He is here for on treatment assessment prior to cycle 15 of palliative Keytruda  Counts okay to proceed with cycle 15 of palliative Keytruda today.  I will see him back in 3 weeks for cycle 16.  Plan to repeat scans after 16 cycles.  TSH today is mildly elevated at 6.06.  He has had elevated TSH in the past but subsequently normalized.  I will hold off on starting any thyroid medicine at this time and repeat TSH in 6 weeks.   Visit Diagnosis 1. Encounter for antineoplastic immunotherapy   2. 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 Randa Evens, MD, MPH [facility] at [facility] [phone] 12/07/2020 9:44 AM

## 2020-12-25 ENCOUNTER — Other Ambulatory Visit: Payer: Self-pay | Admitting: *Deleted

## 2020-12-25 DIAGNOSIS — C099 Malignant neoplasm of tonsil, unspecified: Secondary | ICD-10-CM

## 2020-12-28 ENCOUNTER — Inpatient Hospital Stay: Payer: Medicare Other | Admitting: Oncology

## 2020-12-28 ENCOUNTER — Inpatient Hospital Stay: Payer: Medicare Other | Attending: Oncology

## 2020-12-28 ENCOUNTER — Encounter: Payer: Self-pay | Admitting: Oncology

## 2020-12-28 ENCOUNTER — Other Ambulatory Visit: Payer: Self-pay

## 2020-12-28 ENCOUNTER — Inpatient Hospital Stay: Payer: Medicare Other

## 2020-12-28 VITALS — BP 121/72 | HR 65 | Temp 97.0°F | Resp 20 | Wt 190.7 lb

## 2020-12-28 DIAGNOSIS — C109 Malignant neoplasm of oropharynx, unspecified: Secondary | ICD-10-CM | POA: Diagnosis present

## 2020-12-28 DIAGNOSIS — C099 Malignant neoplasm of tonsil, unspecified: Secondary | ICD-10-CM

## 2020-12-28 DIAGNOSIS — F1190 Opioid use, unspecified, uncomplicated: Secondary | ICD-10-CM

## 2020-12-28 DIAGNOSIS — C771 Secondary and unspecified malignant neoplasm of intrathoracic lymph nodes: Secondary | ICD-10-CM | POA: Diagnosis not present

## 2020-12-28 DIAGNOSIS — Z79899 Other long term (current) drug therapy: Secondary | ICD-10-CM | POA: Insufficient documentation

## 2020-12-28 DIAGNOSIS — R7989 Other specified abnormal findings of blood chemistry: Secondary | ICD-10-CM | POA: Diagnosis not present

## 2020-12-28 DIAGNOSIS — Z5112 Encounter for antineoplastic immunotherapy: Secondary | ICD-10-CM | POA: Diagnosis present

## 2020-12-28 LAB — COMPREHENSIVE METABOLIC PANEL
ALT: 11 U/L (ref 0–44)
AST: 20 U/L (ref 15–41)
Albumin: 3.9 g/dL (ref 3.5–5.0)
Alkaline Phosphatase: 84 U/L (ref 38–126)
Anion gap: 13 (ref 5–15)
BUN: 17 mg/dL (ref 8–23)
CO2: 23 mmol/L (ref 22–32)
Calcium: 8.8 mg/dL — ABNORMAL LOW (ref 8.9–10.3)
Chloride: 104 mmol/L (ref 98–111)
Creatinine, Ser: 0.88 mg/dL (ref 0.61–1.24)
GFR, Estimated: >60 mL/min (ref >60)
Glucose, Bld: 106 mg/dL — ABNORMAL HIGH (ref 70–99)
Potassium: 3.8 mmol/L (ref 3.5–5.1)
Sodium: 140 mmol/L (ref 135–145)
Total Bilirubin: 0.6 mg/dL (ref 0.3–1.2)
Total Protein: 7.3 g/dL (ref 6.5–8.1)

## 2020-12-28 LAB — CBC WITH DIFFERENTIAL/PLATELET
Abs Immature Granulocytes: 0.02 10*3/uL (ref 0.00–0.07)
Basophils Absolute: 0 10*3/uL (ref 0.0–0.1)
Basophils Relative: 0 %
Eosinophils Absolute: 0.3 10*3/uL (ref 0.0–0.5)
Eosinophils Relative: 4 %
HCT: 38.4 % — ABNORMAL LOW (ref 39.0–52.0)
Hemoglobin: 12.6 g/dL — ABNORMAL LOW (ref 13.0–17.0)
Immature Granulocytes: 0 %
Lymphocytes Relative: 17 %
Lymphs Abs: 1.2 10*3/uL (ref 0.7–4.0)
MCH: 32.3 pg (ref 26.0–34.0)
MCHC: 32.8 g/dL (ref 30.0–36.0)
MCV: 98.5 fL (ref 80.0–100.0)
Monocytes Absolute: 0.7 10*3/uL (ref 0.1–1.0)
Monocytes Relative: 10 %
Neutro Abs: 5.1 10*3/uL (ref 1.7–7.7)
Neutrophils Relative %: 69 %
Platelets: 251 10*3/uL (ref 150–400)
RBC: 3.9 MIL/uL — ABNORMAL LOW (ref 4.22–5.81)
RDW: 13.3 % (ref 11.5–15.5)
WBC: 7.4 10*3/uL (ref 4.0–10.5)
nRBC: 0 % (ref 0.0–0.2)

## 2020-12-28 MED ORDER — SODIUM CHLORIDE 0.9 % IV SOLN
200.0000 mg | Freq: Once | INTRAVENOUS | Status: AC
  Administered 2020-12-28: 200 mg via INTRAVENOUS
  Filled 2020-12-28: qty 8

## 2020-12-28 MED ORDER — HEPARIN SOD (PORK) LOCK FLUSH 100 UNIT/ML IV SOLN
500.0000 [IU] | Freq: Once | INTRAVENOUS | Status: AC | PRN
  Administered 2020-12-28: 500 [IU]
  Filled 2020-12-28: qty 5

## 2020-12-28 MED ORDER — OXYCODONE HCL 5 MG PO TABS
5.0000 mg | ORAL_TABLET | ORAL | 0 refills | Status: DC | PRN
Start: 2020-12-28 — End: 2021-02-08

## 2020-12-28 MED ORDER — PROCHLORPERAZINE EDISYLATE 10 MG/2ML IJ SOLN
10.0000 mg | Freq: Once | INTRAMUSCULAR | Status: AC
  Administered 2020-12-28: 10 mg via INTRAVENOUS
  Filled 2020-12-28: qty 2

## 2020-12-28 MED ORDER — SODIUM CHLORIDE 0.9 % IV SOLN
Freq: Once | INTRAVENOUS | Status: AC
Start: 2020-12-28 — End: 2020-12-28
  Filled 2020-12-28: qty 250

## 2020-12-28 MED ORDER — HEPARIN SOD (PORK) LOCK FLUSH 100 UNIT/ML IV SOLN
500.0000 [IU] | Freq: Once | INTRAVENOUS | Status: DC
  Filled 2020-12-28: qty 5

## 2020-12-28 MED ORDER — SODIUM CHLORIDE 0.9% FLUSH
10.0000 mL | INTRAVENOUS | Status: DC | PRN
  Administered 2020-12-28: 10 mL
  Filled 2020-12-28: qty 10

## 2020-12-28 MED ORDER — SODIUM CHLORIDE 0.9% FLUSH
10.0000 mL | INTRAVENOUS | Status: DC | PRN
  Administered 2020-12-28: 10 mL via INTRAVENOUS
  Filled 2020-12-28: qty 10

## 2020-12-28 NOTE — Progress Notes (Signed)
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 Richardson Landry for symptoms of sore throat and difficulty swallowing which she has been experiencing over the last 1 year.He underwent a comprehensive auto laryngoscopy exam which showed a firm enlarged right tonsillar mass with necrotic area extending onto the adjacent soft palate into the root of the uvula. Hypopharynx and larynx as well as nasopharynx was unable to be visualized due to gag reflex. 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  PET CT scan showed large hypermetabolic right palatine tonsil mass which crosses the midline. Ipsilateral cervical nodal metastases. Bilateral pulmonary nodules including a dominant 1 cm hypermetabolic right lung base nodule. Multiplicity favor pulmonary metastases. Subcarinal and equivocal left hilar nodal metastases. Hypermetabolism involving the ascending colon. Question mild diverticulitis versus underlying colonic mass or polyp cannot be excluded   Interval history-patient reports doing well and denies any complaints at this time.  He has chronic back pain which is stable  ECOG PS- 1 Pain scale- 0 Opioid associated constipation- no  Review of systems- Review of Systems  Constitutional: Negative for chills, fever, malaise/fatigue and  weight loss.  HENT: Negative for congestion, ear discharge and nosebleeds.   Eyes: Negative for blurred vision.  Respiratory: Negative for cough, hemoptysis, sputum production, shortness of breath and wheezing.   Cardiovascular: Negative for chest pain, palpitations, orthopnea and claudication.  Gastrointestinal: Negative for abdominal pain, blood in stool, constipation, diarrhea, heartburn, melena, nausea and vomiting.  Genitourinary: Negative for dysuria, flank pain, frequency, hematuria and urgency.  Musculoskeletal: Positive for back pain. Negative for joint pain and myalgias.  Skin: Negative for rash.  Neurological: Negative for dizziness, tingling, focal weakness, seizures, weakness and headaches.  Endo/Heme/Allergies: Does not bruise/bleed easily.  Psychiatric/Behavioral: Negative for depression and suicidal ideas. The patient does not have insomnia.      No Known Allergies   Past Medical History:  Diagnosis Date  . Asthma   . Lung nodules   . Metastasis to lung (HCC)    tonsil cancer and mets to lung  . Tonsil cancer ([place])   . Tonsil cancer [facility])      Past Surgical History:  Procedure Laterality Date  . CERVICAL SPINE SURGERY    . LUMBAR SPINE SURGERY    . PORTA CATH INSERTION N/A 10/08/2019   Procedure: PORTA CATH INSERTION;  Surgeon: Katha Cabal, MD;  Location: [place] CV LAB;  Service: Cardiovascular;  Laterality: N/A;  . VIDEO BRONCHOSCOPY WITH ENDOBRONCHIAL NAVIGATION N/A 10/10/2019   Procedure: VIDEO BRONCHOSCOPY WITH ENDOBRONCHIAL NAVIGATION;  Surgeon: Tyler Pita, MD;  Location: [facility] ORS;  Service: Pulmonary;  Laterality: N/A;  . VIDEO BRONCHOSCOPY WITH ENDOBRONCHIAL ULTRASOUND N/A 10/10/2019   Procedure: VIDEO BRONCHOSCOPY WITH ENDOBRONCHIAL ULTRASOUND;  Surgeon: Tyler Pita, MD;  Location: [facility] ORS;  Service: Pulmonary;  Laterality: N/A;    Social History   Socioeconomic History  .  Marital status: Widowed    Spouse name: Not on  file  . Number of children: Not on file  . Years of education: Not on file  . Highest education level: Not on file  Occupational History  . Not on file  Tobacco Use  . Smoking status: Never Smoker  . Smokeless tobacco: Never Used  Vaping Use  . Vaping Use: Never used  Substance and Sexual Activity  . Alcohol use: Never  . Drug use: Never  . Sexual activity: Not Currently  Other Topics Concern  . Not on file  Social History Narrative  . Not on file   Social Determinants of Health   Financial Resource Strain: Not on file  Food Insecurity: Not on file  Transportation Needs: Not on file  Physical Activity: Not on file  Stress: Not on file  Social Connections: Not on file  Intimate Partner Violence: Not on file    Family History  Problem Relation Age of Onset  . Cancer Mother 73       not sure what type  . Prostate cancer Father   . Rheum arthritis Father   . Healthy Sister   . Healthy Brother   . Healthy Sister   . Healthy Brother   . Healthy Brother   . Heart disease Brother   . Heart disease Brother      Current Outpatient Medications:  .  acetaminophen (TYLENOL) 500 MG tablet, Take 500-1,000 mg by mouth every 8 (eight) hours as needed (for pain.). , Disp: , Rfl:  .  SYMBICORT 160-4.5 MCG/ACT inhaler, Inhale 2 puffs into the lungs 2 (two) times daily., Disp: 1 each, Rfl: 2 .  albuterol (VENTOLIN HFA) 108 (90 Base) MCG/ACT inhaler, Inhale 2 puffs into the lungs every 6 (six) hours as needed (wheezing/shortness of breath). (Patient not taking: No sig reported), Disp: , Rfl:  .  lidocaine-prilocaine (EMLA) cream, Apply to affected area once (Patient not taking: No sig reported), Disp: 30 g, Rfl: 3 .  LORazepam (ATIVAN) 0.5 MG tablet, Take 1 tablet (0.5 mg total) by mouth every 6 (six) hours as needed (Nausea or vomiting). (Patient not taking: No sig reported), Disp: 30 tablet, Rfl: 0 .  OLANZapine (ZYPREXA) 10 MG tablet, Take 1 tablet (10 mg total) by mouth at bedtime.  (Patient not taking: No sig reported), Disp: 30 tablet, Rfl: 2 .  ondansetron (ZOFRAN) 8 MG tablet, Take 1 tablet (8 mg total) by mouth 2 (two) times daily as needed. Start on the third day after chemotherapy. (Patient not taking: No sig reported), Disp: 30 tablet, Rfl: 1 .  oxyCODONE (OXY IR/ROXICODONE) 5 MG immediate release tablet, Take 1 tablet (5 mg total) by mouth every 4 (four) hours as needed for severe pain., Disp: 60 tablet, Rfl: 0 .  prochlorperazine (COMPAZINE) 10 MG tablet, Take 1 tablet (10 mg total) by mouth every 6 (six) hours as needed (Nausea or vomiting). (Patient not taking: No sig reported), Disp: 30 tablet, Rfl: 1 No current facility-administered medications for this visit.  Facility-Administered Medications Ordered in Other Visits:  .  heparin lock flush 100 unit/mL, 500 Units, Intravenous, Once, Randa Evens C, MD .  sodium chloride flush (NS) 0.9 % injection 10 mL, 10 mL, Intravenous, PRN, Sindy Guadeloupe, MD, 10 mL at 03/09/20 0919 .  sodium chloride flush (NS) 0.9 % injection 10 mL, 10 mL, Intravenous, PRN, Sindy Guadeloupe, MD, 10 mL at 12/28/20 0853 .  sodium chloride flush (NS) 0.9 % injection 10  mL, 10 mL, Intracatheter, PRN, Sindy Guadeloupe, MD, 10 mL at 12/28/20 1140  Physical exam:  Vitals:   12/28/20 0906  BP: 121/72  Pulse: 65  Resp: 20  Temp: (!) 97 F (36.1 C)  TempSrc: Tympanic  SpO2: 100%  Weight: 190 lb 11.2 oz (86.5 kg)   Physical Exam Constitutional:      General: He is not in acute distress. Cardiovascular:     Rate and Rhythm: Normal rate and regular rhythm.     Heart sounds: Normal heart sounds.  Pulmonary:     Effort: Pulmonary effort is normal.     Breath sounds: Normal breath sounds.  Abdominal:     General: Bowel sounds are normal.     Palpations: Abdomen is soft.  Skin:    General: Skin is warm and dry.  Neurological:     Mental Status: He is alert and oriented to person, place, and time.      CMP Latest Ref Rng & Units  12/28/2020  Glucose 70 - 99 mg/dL 106(H)  BUN 8 - 23 mg/dL 17  Creatinine 0.61 - 1.24 mg/dL 0.88  Sodium 135 - 145 mmol/L 140  Potassium 3.5 - 5.1 mmol/L 3.8  Chloride 98 - 111 mmol/L 104  CO2 22 - 32 mmol/L 23  Calcium 8.9 - 10.3 mg/dL 8.8(L)  Total Protein 6.5 - 8.1 g/dL 7.3  Total Bilirubin 0.3 - 1.2 mg/dL 0.6  Alkaline Phos 38 - 126 U/L 84  AST 15 - 41 U/L 20  ALT 0 - 44 U/L 11   CBC Latest Ref Rng & Units 12/28/2020  WBC 4.0 - 10.5 K/uL 7.4  Hemoglobin 13.0 - 17.0 g/dL 12.6(L)  Hematocrit 39.0 - 52.0 % 38.4(L)  Platelets 150 - 400 K/uL 251     Assessment and plan- Patient is a 70 y.o. 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  He is here for on treatment assessment prior to cycle 16 of palliative Keytruda  Counts okay to proceed with cycle 16 of palliative Keytruda today.  I will see him back in 3 weeks for cycle 17.  I will plan to get scans after cycle 18  Abnormal TSH: It has been waxing and waning in 3 weeks ago it was 6.0.  Repeat TSH in 3 weeks  Patient has been on as needed oxycodoneFor his low back pain and he sometimes reports pain during swallowing for which she uses oxycodone as well.  Prescription has been renewed today   Visit Diagnosis 1. Encounter for antineoplastic immunotherapy   2. 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 Randa Evens, MD, MPH [facility] at [facility] [phone] 12/28/2020 12:54 PM

## 2020-12-28 NOTE — Patient Instructions (Signed)
[place] ONCOLOGY  Discharge Instructions: Thank you for choosing [facility] to provide your oncology and hematology care.  If you have a lab appointment with the [facility], please go directly to the [facility] and check in at the registration area.  Wear comfortable clothing and clothing appropriate for easy access to any Portacath or PICC line.   We strive to give you quality time with your provider. You may need to reschedule your appointment if you arrive late (15 or more minutes).  Arriving late affects you and other patients whose appointments are after yours.  Also, if you miss three or more appointments without notifying the office, you may be dismissed from the clinic at the provider's discretion.      For prescription refill requests, have your pharmacy contact our office and allow 72 hours for refills to be completed.    Today you received the following chemotherapy and/or immunotherapy agents: Keytruda     To help prevent nausea and vomiting after your treatment, we encourage you to take your nausea medication as directed.  BELOW ARE SYMPTOMS THAT SHOULD BE REPORTED IMMEDIATELY: . *FEVER GREATER THAN 100.4 F (38 C) OR HIGHER . *CHILLS OR SWEATING . *NAUSEA AND VOMITING THAT IS NOT CONTROLLED WITH YOUR NAUSEA MEDICATION . *UNUSUAL SHORTNESS OF BREATH . *UNUSUAL BRUISING OR BLEEDING . *URINARY PROBLEMS (pain or burning when urinating, or frequent urination) . *BOWEL PROBLEMS (unusual diarrhea, constipation, pain near the anus) . TENDERNESS IN MOUTH AND THROAT WITH OR WITHOUT PRESENCE OF ULCERS (sore throat, sores in mouth, or a toothache) . UNUSUAL RASH, SWELLING OR PAIN  . UNUSUAL VAGINAL DISCHARGE OR ITCHING   Items with * indicate a potential emergency and should be followed up as soon as possible or go to the Emergency Department if any problems should occur.  Please show the CHEMOTHERAPY ALERT CARD or IMMUNOTHERAPY ALERT  CARD at check-in to the Emergency Department and triage nurse.  Should you have questions after your visit or need to cancel or reschedule your appointment, please contact [facility]  [phone] and follow the prompts.  Office hours are 8:00 a.m. to 4:30 p.m. Monday - Friday. Please note that voicemails left after 4:00 p.m. may not be returned until the following business day.  We are closed weekends and major holidays. You have access to a nurse at all times for urgent questions. Please call the main number to the clinic [phone] and follow the prompts.  For any non-urgent questions, you may also contact your provider using MyChart. We now offer e-Visits for anyone 105 and older to request care online for non-urgent symptoms. For details visit [email or web address].   Also download the MyChart app! Go to the app store, search "MyChart", open the app, select [facility], and log in with your MyChart username and password.  Due to Covid, a mask is required upon entering the hospital/clinic. If you do not have a mask, one will be given to you upon arrival. For doctor visits, patients may have 1 support person aged 69 or older with them. For treatment visits, patients cannot have anyone with them due to current Covid guidelines and our immunocompromised population.

## 2021-01-18 ENCOUNTER — Inpatient Hospital Stay (HOSPITAL_BASED_OUTPATIENT_CLINIC_OR_DEPARTMENT_OTHER): Payer: Medicare Other | Admitting: Oncology

## 2021-01-18 ENCOUNTER — Inpatient Hospital Stay: Payer: Medicare Other

## 2021-01-18 ENCOUNTER — Encounter: Payer: Self-pay | Admitting: Oncology

## 2021-01-18 VITALS — BP 127/84 | HR 66 | Temp 98.2°F | Resp 16 | Ht 70.0 in | Wt 194.5 lb

## 2021-01-18 DIAGNOSIS — C099 Malignant neoplasm of tonsil, unspecified: Secondary | ICD-10-CM

## 2021-01-18 DIAGNOSIS — Z5112 Encounter for antineoplastic immunotherapy: Secondary | ICD-10-CM | POA: Diagnosis not present

## 2021-01-18 LAB — COMPREHENSIVE METABOLIC PANEL
ALT: 10 U/L (ref 0–44)
AST: 17 U/L (ref 15–41)
Albumin: 3.8 g/dL (ref 3.5–5.0)
Alkaline Phosphatase: 82 U/L (ref 38–126)
Anion gap: 13 (ref 5–15)
BUN: 18 mg/dL (ref 8–23)
CO2: 22 mmol/L (ref 22–32)
Calcium: 8.6 mg/dL — ABNORMAL LOW (ref 8.9–10.3)
Chloride: 104 mmol/L (ref 98–111)
Creatinine, Ser: 0.81 mg/dL (ref 0.61–1.24)
GFR, Estimated: >60 mL/min (ref >60)
Glucose, Bld: 100 mg/dL — ABNORMAL HIGH (ref 70–99)
Potassium: 3.7 mmol/L (ref 3.5–5.1)
Sodium: 139 mmol/L (ref 135–145)
Total Bilirubin: 0.7 mg/dL (ref 0.3–1.2)
Total Protein: 6.9 g/dL (ref 6.5–8.1)

## 2021-01-18 LAB — CBC WITH DIFFERENTIAL/PLATELET
Abs Immature Granulocytes: 0.02 10*3/uL (ref 0.00–0.07)
Basophils Absolute: 0.1 10*3/uL (ref 0.0–0.1)
Basophils Relative: 1 %
Eosinophils Absolute: 0.3 10*3/uL (ref 0.0–0.5)
Eosinophils Relative: 4 %
HCT: 38.1 % — ABNORMAL LOW (ref 39.0–52.0)
Hemoglobin: 12.7 g/dL — ABNORMAL LOW (ref 13.0–17.0)
Immature Granulocytes: 0 %
Lymphocytes Relative: 21 %
Lymphs Abs: 1.3 10*3/uL (ref 0.7–4.0)
MCH: 32 pg (ref 26.0–34.0)
MCHC: 33.3 g/dL (ref 30.0–36.0)
MCV: 96 fL (ref 80.0–100.0)
Monocytes Absolute: 0.6 10*3/uL (ref 0.1–1.0)
Monocytes Relative: 10 %
Neutro Abs: 3.9 10*3/uL (ref 1.7–7.7)
Neutrophils Relative %: 64 %
Platelets: 231 10*3/uL (ref 150–400)
RBC: 3.97 MIL/uL — ABNORMAL LOW (ref 4.22–5.81)
RDW: 13.2 % (ref 11.5–15.5)
WBC: 6.1 10*3/uL (ref 4.0–10.5)
nRBC: 0 % (ref 0.0–0.2)

## 2021-01-18 MED ORDER — HEPARIN SOD (PORK) LOCK FLUSH 100 UNIT/ML IV SOLN
INTRAVENOUS | Status: AC
  Filled 2021-01-18: qty 5

## 2021-01-18 MED ORDER — HEPARIN SOD (PORK) LOCK FLUSH 100 UNIT/ML IV SOLN
500.0000 [IU] | Freq: Once | INTRAVENOUS | Status: AC | PRN
  Administered 2021-01-18: 500 [IU]
  Filled 2021-01-18: qty 5

## 2021-01-18 MED ORDER — PROCHLORPERAZINE EDISYLATE 10 MG/2ML IJ SOLN
10.0000 mg | Freq: Once | INTRAMUSCULAR | Status: AC
  Administered 2021-01-18: 10 mg via INTRAVENOUS
  Filled 2021-01-18: qty 2

## 2021-01-18 MED ORDER — SODIUM CHLORIDE 0.9 % IV SOLN
200.0000 mg | Freq: Once | INTRAVENOUS | Status: AC
  Administered 2021-01-18: 200 mg via INTRAVENOUS
  Filled 2021-01-18: qty 8

## 2021-01-18 MED ORDER — SODIUM CHLORIDE 0.9% FLUSH
10.0000 mL | Freq: Once | INTRAVENOUS | Status: AC
  Administered 2021-01-18: 10 mL via INTRAVENOUS
  Filled 2021-01-18: qty 10

## 2021-01-18 MED ORDER — SODIUM CHLORIDE 0.9 % IV SOLN
Freq: Once | INTRAVENOUS | Status: AC
  Filled 2021-01-18: qty 250

## 2021-01-18 NOTE — Progress Notes (Signed)
Pt doing good, no concerns

## 2021-01-18 NOTE — Patient Instructions (Signed)
[place] [place] ONCOLOGY  Discharge Instructions: Thank you for choosing [facility] to provide your oncology and hematology care.  If you have a lab appointment with the [facility], please go directly to the [facility] and check in at the registration area.  Wear comfortable clothing and clothing appropriate for easy access to any Portacath or PICC line.   We strive to give you quality time with your provider. You may need to reschedule your appointment if you arrive late (15 or more minutes).  Arriving late affects you and other patients whose appointments are after yours.  Also, if you miss three or more appointments without notifying the office, you may be dismissed from the clinic at the provider's discretion.      For prescription refill requests, have your pharmacy contact our office and allow 72 hours for refills to be completed.    Today you received the following chemotherapy and/or immunotherapy agents Keytruda Pembrolizumab injection What is this medicine? PEMBROLIZUMAB (pem broe liz ue mab) is a monoclonal antibody. It is used to treat certain types of cancer. This medicine may be used for other purposes; ask your health care provider or pharmacist if you have questions. COMMON BRAND NAME(S): Keytruda What should I tell my health care provider before I take this medicine? They need to know if you have any of these conditions:  autoimmune diseases like Crohn's disease, ulcerative colitis, or lupus  have had or planning to have an allogeneic stem cell transplant (uses someone else's stem cells)  history of organ transplant  history of chest radiation  nervous system problems like myasthenia gravis or Guillain-Barre syndrome  an unusual or allergic reaction to pembrolizumab, other medicines, foods, dyes, or preservatives  pregnant or trying to get pregnant  breast-feeding How should I use this medicine? This medicine is for  infusion into a vein. It is given by a health care professional in a hospital or clinic setting. A special MedGuide will be given to you before each treatment. Be sure to read this information carefully each time. Talk to your pediatrician regarding the use of this medicine in children. While this drug may be prescribed for children as young as 6 months for selected conditions, precautions do apply. Overdosage: If you think you have taken too much of this medicine contact a poison control center or emergency room at once. NOTE: This medicine is only for you. Do not share this medicine with others. What if I miss a dose? It is important not to miss your dose. Call your doctor or health care professional if you are unable to keep an appointment. What may interact with this medicine? Interactions have not been studied. This list may not describe all possible interactions. Give your health care provider a list of all the medicines, herbs, non-prescription drugs, or dietary supplements you use. Also tell them if you smoke, drink alcohol, or use illegal drugs. Some items may interact with your medicine. What should I watch for while using this medicine? Your condition will be monitored carefully while you are receiving this medicine. You may need blood work done while you are taking this medicine. Do not become pregnant while taking this medicine or for 4 months after stopping it. Women should inform their doctor if they wish to become pregnant or think they might be pregnant. There is a potential for serious side effects to an unborn child. Talk to your health care professional or pharmacist for more information. Do not  breast-feed an infant while taking this medicine or for 4 months after the last dose. What side effects may I notice from receiving this medicine? Side effects that you should report to your doctor or health care professional as soon as possible:  allergic reactions like skin rash,  itching or hives, swelling of the face, lips, or tongue  bloody or black, tarry  breathing problems  changes in vision  chest pain  chills  confusion  constipation  cough  diarrhea  dizziness or feeling faint or lightheaded  fast or irregular heartbeat  fever  flushing  joint pain  low blood counts - this medicine may decrease the number of white blood cells, red blood cells and platelets. You may be at increased risk for infections and bleeding.  muscle pain  muscle weakness  pain, tingling, numbness in the hands or feet  persistent headache  redness, blistering, peeling or loosening of the skin, including inside the mouth  signs and symptoms of high blood sugar such as dizziness; dry mouth; dry skin; fruity breath; nausea; stomach pain; increased hunger or thirst; increased urination  signs and symptoms of kidney injury like trouble passing urine or change in the amount of urine  signs and symptoms of liver injury like dark urine, light-colored stools, loss of appetite, nausea, right upper belly pain, yellowing of the eyes or skin  sweating  swollen lymph nodes  weight loss Side effects that usually do not require medical attention (report to your doctor or health care professional if they continue or are bothersome):  decreased appetite  hair loss  tiredness This list may not describe all possible side effects. Call your doctor for medical advice about side effects. You may report side effects to FDA at 1-800-FDA-1088. Where should I keep my medicine? This drug is given in a hospital or clinic and will not be stored at home. NOTE: This sheet is a summary. It may not cover all possible information. If you have questions about this medicine, talk to your doctor, pharmacist, or health care provider.  2021 Elsevier/Gold Standard (2019-07-16 21:44:53)       To help prevent nausea and vomiting after your treatment, we encourage you to take your nausea  medication as directed.  BELOW ARE SYMPTOMS THAT SHOULD BE REPORTED IMMEDIATELY: . *FEVER GREATER THAN 100.4 F (38 C) OR HIGHER . *CHILLS OR SWEATING . *NAUSEA AND VOMITING THAT IS NOT CONTROLLED WITH YOUR NAUSEA MEDICATION . *UNUSUAL SHORTNESS OF BREATH . *UNUSUAL BRUISING OR BLEEDING . *URINARY PROBLEMS (pain or burning when urinating, or frequent urination) . *BOWEL PROBLEMS (unusual diarrhea, constipation, pain near the anus) . TENDERNESS IN MOUTH AND THROAT WITH OR WITHOUT PRESENCE OF ULCERS (sore throat, sores in mouth, or a toothache) . UNUSUAL RASH, SWELLING OR PAIN  . UNUSUAL VAGINAL DISCHARGE OR ITCHING   Items with * indicate a potential emergency and should be followed up as soon as possible or go to the Emergency Department if any problems should occur.  Please show the CHEMOTHERAPY ALERT CARD or IMMUNOTHERAPY ALERT CARD at check-in to the Emergency Department and triage nurse.  Should you have questions after your visit or need to cancel or reschedule your appointment, please contact [facility]  [phone] and follow the prompts.  Office hours are 8:00 a.m. to 4:30 p.m. Monday - Friday. Please note that voicemails left after 4:00 p.m. may not be returned until the following business day.  We are closed weekends and major holidays. You  have access to a nurse at all times for urgent questions. Please call the main number to the clinic [phone] and follow the prompts.  For any non-urgent questions, you may also contact your provider using MyChart. We now offer e-Visits for anyone 67 and older to request care online for non-urgent symptoms. For details visit [email or web address].   Also download the MyChart app! Go to the app store, search "MyChart", open the app, select [facility], and log in with your MyChart username and password.  Due to Covid, a mask is required upon entering the hospital/clinic. If you do not have a mask, one  will be given to you upon arrival. For doctor visits, patients may have 1 support person aged 47 or older with them. For treatment visits, patients cannot have anyone with them due to current Covid guidelines and our immunocompromised population.

## 2021-01-18 NOTE — Progress Notes (Signed)
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 Richardson Landry for symptoms of sore throat and difficulty swallowing which she has been experiencing over the last 1 year.He underwent a comprehensive auto laryngoscopy exam which showed a firm enlarged right tonsillar mass with necrotic area extending onto the adjacent soft palate into the root of the uvula. Hypopharynx and larynx as well as nasopharynx was unable to be visualized due to gag reflex. 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  PET CT scan showed large hypermetabolic right palatine tonsil mass which crosses the midline. Ipsilateral cervical nodal metastases. Bilateral pulmonary nodules including a dominant 1 cm hypermetabolic right lung base nodule. Multiplicity favor pulmonary metastases. Subcarinal and equivocal left hilar nodal metastases. Hypermetabolism involving the ascending colon. Question mild diverticulitis versus underlying colonic mass or polyp cannot be excluded   Patient underwent EBUS guided biopsy ofhilarlymph nodes. Biopsy showed squamous cell carcinoma. However HPV/p16 status was uninterpretable.  Patient completed 6 cycles of carbotaxol chemotherapy along with Keytruda and is currently on maintenance Keytruda. Scans show far for excellent response to  treatment   Interval history-patient is tolerating treatment well without any significant side effects  ECOG PS- 0 Pain scale- 0 Opioid associated constipation- no  Review of systems- Review of Systems  Constitutional: Negative for chills, fever, malaise/fatigue and weight loss.  HENT: Negative for congestion, ear discharge and nosebleeds.   Eyes: Negative for blurred vision.  Respiratory: Negative for cough, hemoptysis, sputum production, shortness of breath and wheezing.   Cardiovascular: Negative for chest pain, palpitations, orthopnea and claudication.  Gastrointestinal: Negative for abdominal pain, blood in stool, constipation, diarrhea, heartburn, melena, nausea and vomiting.  Genitourinary: Negative for dysuria, flank pain, frequency, hematuria and urgency.  Musculoskeletal: Negative for back pain, joint pain and myalgias.  Skin: Negative for rash.  Neurological: Negative for dizziness, tingling, focal weakness, seizures, weakness and headaches.  Endo/Heme/Allergies: Does not bruise/bleed easily.  Psychiatric/Behavioral: Negative for depression and suicidal ideas. The patient does not have insomnia.     No Known Allergies   Past Medical History:  Diagnosis Date  . Asthma   . Lung nodules   . Metastasis to lung (HCC)    tonsil cancer and mets to lung  . Tonsil cancer ([place])   . Tonsil cancer [facility])      Past Surgical History:  Procedure Laterality Date  . CERVICAL SPINE SURGERY    . LUMBAR SPINE SURGERY    . PORTA CATH INSERTION N/A 10/08/2019   Procedure: PORTA CATH INSERTION;  Surgeon: Katha Cabal, MD;  Location: [facility];  Service: Cardiovascular;  Laterality: N/A;  . VIDEO BRONCHOSCOPY WITH ENDOBRONCHIAL NAVIGATION N/A 10/10/2019   Procedure: VIDEO BRONCHOSCOPY WITH ENDOBRONCHIAL NAVIGATION;  Surgeon: Tyler Pita, MD;  Location: [facility] ORS;  Service: Pulmonary;  Laterality: N/A;  . VIDEO BRONCHOSCOPY WITH ENDOBRONCHIAL ULTRASOUND N/A  10/10/2019   Procedure: VIDEO BRONCHOSCOPY  WITH ENDOBRONCHIAL ULTRASOUND;  Surgeon: Tyler Pita, MD;  Location: [facility] OR[facility];  Service: Pulmonary;  Laterality: N/A;    Social History   Socioeconomic History  . Marital status: Widowed    Spouse name: Not on file  . Number of children: Not on file  . Years of education: Not on file  . Highest education level: Not on file  Occupational History  . Not on file  Tobacco Use  . Smoking status: Never Smoker  . Smokeless tobacco: Never Used  Vaping Use  . Vaping Use: Never used  Substance and Sexual Activity  . Alcohol use: Never  . Drug use: Never  . Sexual activity: Not Currently  Other Topics Concern  . Not on file  Social History Narrative  . Not on file   Social Determinants of Health   Financial Resource Strain: Not on file  Food Insecurity: Not on file  Transportation Needs: Not on file  Physical Activity: Not on file  Stress: Not on file  Social Connections: Not on file  Intimate Partner Violence: Not on file    Family History  Problem Relation Age of Onset  . Cancer Mother 21       not sure what type  . Prostate cancer Father   . Rheum arthritis Father   . Healthy Sister   . Healthy Brother   . Healthy Sister   . Healthy Brother   . Healthy Brother   . Heart disease Brother   . Heart disease Brother      Current Outpatient Medications:  .  acetaminophen (TYLENOL) 500 MG tablet, Take 500-1,000 mg by mouth every 8 (eight) hours as needed (for pain.). , Disp: , Rfl:  .  EPINEPHrine (PRIMATENE MIST IN), Inhale 2 puffs into the lungs as needed (wheezing or sob)., Disp: , Rfl:  .  oxyCODONE (OXY IR/ROXICODONE) 5 MG immediate release tablet, Take 1 tablet (5 mg total) by mouth every 4 (four) hours as needed for severe pain., Disp: 60 tablet, Rfl: 0 .  SYMBICORT 160-4.5 MCG/ACT inhaler, Inhale 2 puffs into the lungs 2 (two) times daily., Disp: 1 each, Rfl: 2 .  lidocaine-prilocaine (EMLA) cream, Apply to affected  area once (Patient not taking: No sig reported), Disp: 30 g, Rfl: 3 .  LORazepam (ATIVAN) 0.5 MG tablet, Take 1 tablet (0.5 mg total) by mouth every 6 (six) hours as needed (Nausea or vomiting). (Patient not taking: No sig reported), Disp: 30 tablet, Rfl: 0 .  OLANZapine (ZYPREXA) 10 MG tablet, Take 1 tablet (10 mg total) by mouth at bedtime. (Patient not taking: No sig reported), Disp: 30 tablet, Rfl: 2 .  ondansetron (ZOFRAN) 8 MG tablet, Take 1 tablet (8 mg total) by mouth 2 (two) times daily as needed. Start on the third day after chemotherapy. (Patient not taking: No sig reported), Disp: 30 tablet, Rfl: 1 .  prochlorperazine (COMPAZINE) 10 MG tablet, Take 1 tablet (10 mg total) by mouth every 6 (six) hours as needed (Nausea or vomiting). (Patient not taking: No sig reported), Disp: 30 tablet, Rfl: 1 No current facility-administered medications for this visit.  Facility-Administered Medications Ordered in Other Visits:  .  sodium chloride flush (NS) 0.9 % injection 10 mL, 10 mL, Intravenous, PRN, Sindy Guadeloupe, MD, 10 mL at 03/09/20 0919  Physical exam:  Vitals:   01/18/21 1016  BP: 127/84  Pulse: 66  Resp: 16  Temp: 98.2 F (36.8 C)  TempSrc: Oral  Weight: 194 lb 8 oz (88.2  kg)  Height: 5\' 10"  (1.778 m)   Physical Exam Constitutional:      General: He is not in acute distress. Cardiovascular:     Rate and Rhythm: Normal rate and regular rhythm.     Heart sounds: Normal heart sounds.  Pulmonary:     Effort: Pulmonary effort is normal.     Breath sounds: Normal breath sounds.  Skin:    General: Skin is warm and dry.  Neurological:     Mental Status: He is alert and oriented to person, place, and time.      CMP Latest Ref Rng & Units 01/18/2021  Glucose 70 - 99 mg/dL 100(H)  BUN 8 - 23 mg/dL 18  Creatinine 0.61 - 1.24 mg/dL 0.81  Sodium 135 - 145 mmol/L 139  Potassium 3.5 - 5.1 mmol/L 3.7  Chloride 98 - 111 mmol/L 104  CO2 22 - 32 mmol/L 22  Calcium 8.9 - 10.3 mg/dL  8.6(L)  Total Protein 6.5 - 8.1 g/dL 6.9  Total Bilirubin 0.3 - 1.2 mg/dL 0.7  Alkaline Phos 38 - 126 U/L 82  AST 15 - 41 U/L 17  ALT 0 - 44 U/L 10   CBC Latest Ref Rng & Units 01/18/2021  WBC 4.0 - 10.5 K/uL 6.1  Hemoglobin 13.0 - 17.0 g/dL 12.7(L)  Hematocrit 39.0 - 52.0 % 38.1(L)  Platelets 150 - 400 K/uL 231     Assessment and plan- Patient is a 70 y.o. 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   He is here for on treatment assessment prior to cycle 17 of palliative Keytruda  Counts okay to proceed with cycle 17 of palliative Keytruda today.  I will see him back in 3 weeks for cycle 19.  Plan to get repeat CT soft tissue neck and CT chest abdomen pelvis without contrast in 4 to 5 weeks.  On his prior PET CT scan in February 2021 he was noted to have an area of hypermetabolic lymph in the ascending colon.  No colon mass was seen on subsequent CT scans.  I will discuss with him at next visit for consideration for colonoscopy given that his head and neck cancer responded well to treatment so far   Visit Diagnosis 1. Encounter for antineoplastic immunotherapy   2. 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 Randa Evens, MD, MPH [facility] at [facility] [phone] 01/18/2021 1:43 PM

## 2021-02-04 ENCOUNTER — Other Ambulatory Visit: Payer: Self-pay

## 2021-02-04 DIAGNOSIS — R7989 Other specified abnormal findings of blood chemistry: Secondary | ICD-10-CM

## 2021-02-04 DIAGNOSIS — C099 Malignant neoplasm of tonsil, unspecified: Secondary | ICD-10-CM

## 2021-02-08 ENCOUNTER — Inpatient Hospital Stay: Payer: Medicare Other | Attending: Oncology

## 2021-02-08 ENCOUNTER — Inpatient Hospital Stay (HOSPITAL_BASED_OUTPATIENT_CLINIC_OR_DEPARTMENT_OTHER): Payer: Medicare Other | Admitting: Oncology

## 2021-02-08 ENCOUNTER — Other Ambulatory Visit: Payer: Self-pay

## 2021-02-08 ENCOUNTER — Inpatient Hospital Stay: Payer: Medicare Other

## 2021-02-08 ENCOUNTER — Encounter: Payer: Self-pay | Admitting: Oncology

## 2021-02-08 VITALS — BP 116/81 | HR 77 | Temp 98.0°F | Resp 17 | Ht 70.0 in | Wt 192.8 lb

## 2021-02-08 DIAGNOSIS — C099 Malignant neoplasm of tonsil, unspecified: Secondary | ICD-10-CM

## 2021-02-08 DIAGNOSIS — C7800 Secondary malignant neoplasm of unspecified lung: Secondary | ICD-10-CM | POA: Diagnosis present

## 2021-02-08 DIAGNOSIS — Z5112 Encounter for antineoplastic immunotherapy: Secondary | ICD-10-CM | POA: Diagnosis not present

## 2021-02-08 DIAGNOSIS — Z79899 Other long term (current) drug therapy: Secondary | ICD-10-CM | POA: Diagnosis not present

## 2021-02-08 DIAGNOSIS — C771 Secondary and unspecified malignant neoplasm of intrathoracic lymph nodes: Secondary | ICD-10-CM | POA: Diagnosis not present

## 2021-02-08 DIAGNOSIS — R7989 Other specified abnormal findings of blood chemistry: Secondary | ICD-10-CM

## 2021-02-08 LAB — CBC WITH DIFFERENTIAL/PLATELET
Abs Immature Granulocytes: 0.02 10*3/uL (ref 0.00–0.07)
Basophils Absolute: 0 10*3/uL (ref 0.0–0.1)
Basophils Relative: 1 %
Eosinophils Absolute: 0.3 10*3/uL (ref 0.0–0.5)
Eosinophils Relative: 3 %
HCT: 39.5 % (ref 39.0–52.0)
Hemoglobin: 13.3 g/dL (ref 13.0–17.0)
Immature Granulocytes: 0 %
Lymphocytes Relative: 15 %
Lymphs Abs: 1.2 10*3/uL (ref 0.7–4.0)
MCH: 32.4 pg (ref 26.0–34.0)
MCHC: 33.7 g/dL (ref 30.0–36.0)
MCV: 96.1 fL (ref 80.0–100.0)
Monocytes Absolute: 0.6 10*3/uL (ref 0.1–1.0)
Monocytes Relative: 8 %
Neutro Abs: 5.7 10*3/uL (ref 1.7–7.7)
Neutrophils Relative %: 73 %
Platelets: 211 10*3/uL (ref 150–400)
RBC: 4.11 MIL/uL — ABNORMAL LOW (ref 4.22–5.81)
RDW: 13 % (ref 11.5–15.5)
WBC: 7.8 10*3/uL (ref 4.0–10.5)
nRBC: 0 % (ref 0.0–0.2)

## 2021-02-08 LAB — COMPREHENSIVE METABOLIC PANEL
ALT: 12 U/L (ref 0–44)
AST: 20 U/L (ref 15–41)
Albumin: 4.1 g/dL (ref 3.5–5.0)
Alkaline Phosphatase: 76 U/L (ref 38–126)
Anion gap: 10 (ref 5–15)
BUN: 17 mg/dL (ref 8–23)
CO2: 24 mmol/L (ref 22–32)
Calcium: 8.8 mg/dL — ABNORMAL LOW (ref 8.9–10.3)
Chloride: 104 mmol/L (ref 98–111)
Creatinine, Ser: 1.03 mg/dL (ref 0.61–1.24)
GFR, Estimated: >60 mL/min (ref >60)
Glucose, Bld: 104 mg/dL — ABNORMAL HIGH (ref 70–99)
Potassium: 3.9 mmol/L (ref 3.5–5.1)
Sodium: 138 mmol/L (ref 135–145)
Total Bilirubin: 0.8 mg/dL (ref 0.3–1.2)
Total Protein: 7.2 g/dL (ref 6.5–8.1)

## 2021-02-08 LAB — TSH: TSH: 4.371 u[IU]/mL (ref 0.350–4.500)

## 2021-02-08 MED ORDER — PROCHLORPERAZINE EDISYLATE 10 MG/2ML IJ SOLN
10.0000 mg | Freq: Once | INTRAMUSCULAR | Status: AC
  Administered 2021-02-08: 10 mg via INTRAVENOUS
  Filled 2021-02-08: qty 2

## 2021-02-08 MED ORDER — OXYCODONE HCL 5 MG PO TABS: 5.0000 mg | ORAL_TABLET | ORAL | 0 refills | Status: DC | PRN

## 2021-02-08 MED ORDER — HEPARIN SOD (PORK) LOCK FLUSH 100 UNIT/ML IV SOLN
500.0000 [IU] | Freq: Once | INTRAVENOUS | Status: AC | PRN
  Administered 2021-02-08: 500 [IU]
  Filled 2021-02-08: qty 5

## 2021-02-08 MED ORDER — SODIUM CHLORIDE 0.9 % IV SOLN
200.0000 mg | Freq: Once | INTRAVENOUS | Status: AC
  Administered 2021-02-08: 200 mg via INTRAVENOUS
  Filled 2021-02-08: qty 8

## 2021-02-08 MED ORDER — SODIUM CHLORIDE 0.9 % IV SOLN
Freq: Once | INTRAVENOUS | Status: AC
  Filled 2021-02-08: qty 250

## 2021-02-08 MED ORDER — HEPARIN SOD (PORK) LOCK FLUSH 100 UNIT/ML IV SOLN
INTRAVENOUS | Status: AC
  Filled 2021-02-08: qty 5

## 2021-02-08 MED ORDER — SODIUM CHLORIDE 0.9% FLUSH
10.0000 mL | Freq: Once | INTRAVENOUS | Status: AC
  Administered 2021-02-08: 10 mL via INTRAVENOUS
  Filled 2021-02-08: qty 10

## 2021-02-08 NOTE — Addendum Note (Signed)
Addended by: Minta Balsam B on: 02/08/2021 02:32 PM   Modules accepted: Orders

## 2021-02-08 NOTE — Progress Notes (Signed)
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 Richardson Landry for symptoms of sore throat and difficulty swallowing which she has been experiencing over the last 1 year.He underwent a comprehensive auto laryngoscopy exam which showed a firm enlarged right tonsillar mass with necrotic area extending onto the adjacent soft palate into the root of the uvula.  Hypopharynx and larynx as well as nasopharynx was unable to be visualized due to gag reflex.  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   PET CT scan showed large hypermetabolic right palatine tonsil mass which crosses the midline.  Ipsilateral cervical nodal metastases.  Bilateral pulmonary nodules including a dominant 1 cm hypermetabolic right lung base nodule.  Multiplicity favor pulmonary metastases.  Subcarinal and equivocal left hilar nodal metastases.  Hypermetabolism involving the ascending colon.  Question mild diverticulitis versus underlying colonic mass or polyp cannot be excluded     Patient underwent EBUS guided biopsy of hilar lymph nodes.  Biopsy showed squamous cell carcinoma.  However HPV/p16 status was uninterpretable.    Patient completed 6 cycles of carbotaxol chemotherapy along with Keytruda and is currently on maintenance Keytruda.  Scans show far for excellent response to  treatment  Interval history-doing well overall and denies any specific complaints at this time.  He does have mild difficulty swallowing and chronic low back pain for which he takes as needed oxycodone about twice a day  ECOG PS- 1 Pain scale- 3 Opioid associated constipation- no  Review of systems- Review of Systems  Constitutional:  Negative for chills, fever, malaise/fatigue and weight loss.  HENT:  Negative for congestion, ear discharge and nosebleeds.   Eyes:  Negative for blurred vision.  Respiratory:  Negative for cough, hemoptysis, sputum production, shortness of breath and wheezing.   Cardiovascular:  Negative for chest pain, palpitations, orthopnea and claudication.  Gastrointestinal:  Negative for abdominal pain, blood in stool, constipation, diarrhea, heartburn, melena, nausea and vomiting.  Genitourinary:  Negative for dysuria, flank pain, frequency, hematuria and urgency.  Musculoskeletal:  Negative for back pain, joint pain and myalgias.  Skin:  Negative for rash.  Neurological:  Negative for dizziness, tingling, focal weakness, seizures, weakness and headaches.  Endo/Heme/Allergies:  Does not bruise/bleed easily.  Psychiatric/Behavioral:  Negative for depression and suicidal ideas. The patient does not have insomnia.       No Known Allergies   Past Medical History:  Diagnosis Date   Asthma    Lung nodules    Metastasis to lung (HCC)    tonsil cancer and mets to lung   Tonsil cancer ([place])    Tonsil cancer [facility])      Past Surgical History:  Procedure Laterality Date   CERVICAL SPINE SURGERY     LUMBAR SPINE SURGERY     PORTA CATH INSERTION N/A 10/08/2019   Procedure: PORTA CATH INSERTION;  Surgeon: Katha Cabal, MD;  Location: [facility] CV  [facility];  Service: Cardiovascular;  Laterality: N/A;   VIDEO BRONCHOSCOPY WITH ENDOBRONCHIAL NAVIGATION N/A 10/10/2019   Procedure: VIDEO BRONCHOSCOPY WITH ENDOBRONCHIAL NAVIGATION;  Surgeon: Tyler Pita, MD;   Location: [facility] ORS;  Service: Pulmonary;  Laterality: N/A;   VIDEO BRONCHOSCOPY WITH ENDOBRONCHIAL ULTRASOUND N/A 10/10/2019   Procedure: VIDEO BRONCHOSCOPY WITH ENDOBRONCHIAL ULTRASOUND;  Surgeon: Tyler Pita, MD;  Location: [facility] ORS;  Service: Pulmonary;  Laterality: N/A;    Social History   Socioeconomic History   Marital status: Widowed    Spouse name: Not on file   Number of children: Not on file   Years of education: Not on file   Highest education level: Not on file  Occupational History   Not on file  Tobacco Use   Smoking status: Never   Smokeless tobacco: Never  Vaping Use   Vaping Use: Never used  Substance and Sexual Activity   Alcohol use: Never   Drug use: Never   Sexual activity: Not Currently  Other Topics Concern   Not on file  Social History Narrative   Not on file   Social Determinants of Health   Financial Resource Strain: Not on file  Food Insecurity: Not on file  Transportation Needs: Not on file  Physical Activity: Not on file  Stress: Not on file  Social Connections: Not on file  Intimate Partner Violence: Not on file    Family History  Problem Relation Age of Onset   Cancer Mother 50       not sure what type   Prostate cancer Father    Rheum arthritis Father    Healthy Sister    Healthy Brother    Healthy Sister    Healthy Brother    Healthy Brother    Heart disease Brother    Heart disease Brother      Current Outpatient Medications:    SYMBICORT 160-4.5 MCG/ACT inhaler, Inhale 2 puffs into the lungs 2 (two) times daily., Disp: 1 each, Rfl: 2   acetaminophen (TYLENOL) 500 MG tablet, Take 500-1,000 mg by mouth every 8 (eight) hours as needed (for pain.). , Disp: , Rfl:    EPINEPHrine (PRIMATENE MIST IN), Inhale 2 puffs into the lungs as needed (wheezing or sob)., Disp: , Rfl:    lidocaine-prilocaine (EMLA) cream, Apply to affected area once (Patient not taking: No sig reported), Disp: 30 g, Rfl: 3   LORazepam (ATIVAN) 0.5 MG  tablet, Take 1 tablet (0.5 mg total) by mouth every 6 (six) hours as needed (Nausea or vomiting). (Patient not taking: No sig reported), Disp: 30 tablet, Rfl: 0   OLANZapine (ZYPREXA) 10 MG tablet, Take 1 tablet (10 mg total) by mouth at bedtime. (Patient not taking: No sig reported), Disp: 30 tablet, Rfl: 2   ondansetron (ZOFRAN) 8 MG tablet, Take 1 tablet (8 mg total) by mouth 2 (two) times daily as needed. Start on the third day after chemotherapy. (Patient not taking: No sig reported), Disp: 30 tablet, Rfl: 1   oxyCODONE (OXY IR/ROXICODONE) 5 MG immediate release tablet, Take 1 tablet (5 mg total) by mouth every 4 (four) hours as needed for severe pain., Disp: 60 tablet, Rfl: 0   prochlorperazine (COMPAZINE) 10 MG tablet, Take 1 tablet (10 mg total) by mouth every 6 (six) hours as needed (Nausea or vomiting). (Patient not taking: No sig reported), Disp: 30 tablet, Rfl: 1 No current facility-administered medications for this visit.  Facility-Administered Medications Ordered in Other Visits:    sodium chloride flush (  NS) 0.9 % injection 10 mL, 10 mL, Intravenous, PRN, Sindy Guadeloupe, MD, 10 mL at 03/09/20 0919  Physical exam:  Vitals:   02/08/21 0954  BP: 116/81  Pulse: 77  Resp: 17  Temp: 98 F (36.7 C)  SpO2: 99%  Weight: 192 lb 12.8 oz (87.5 kg)  Height: 5\' 10"  (1.778 m)   Physical Exam Cardiovascular:     Rate and Rhythm: Normal rate and regular rhythm.     Heart sounds: Normal heart sounds.  Pulmonary:     Effort: Pulmonary effort is normal.     Breath sounds: Normal breath sounds.  Lymphadenopathy:     Comments: No palpable cervical adenopathy  Skin:    General: Skin is warm and dry.  Neurological:     Mental Status: He is alert and oriented to person, place, and time.     CMP Latest Ref Rng & Units 02/08/2021  Glucose 70 - 99 mg/dL 104(H)  BUN 8 - 23 mg/dL 17  Creatinine 0.61 - 1.24 mg/dL 1.03  Sodium 135 - 145 mmol/L 138  Potassium 3.5 - 5.1 mmol/L 3.9  Chloride 98  - 111 mmol/L 104  CO2 22 - 32 mmol/L 24  Calcium 8.9 - 10.3 mg/dL 8.8(L)  Total Protein 6.5 - 8.1 g/dL 7.2  Total Bilirubin 0.3 - 1.2 mg/dL 0.8  Alkaline Phos 38 - 126 U/L 76  AST 15 - 41 U/L 20  ALT 0 - 44 U/L 12   CBC Latest Ref Rng & Units 02/08/2021  WBC 4.0 - 10.5 K/uL 7.8  Hemoglobin 13.0 - 17.0 g/dL 13.3  Hematocrit 39.0 - 52.0 % 39.5  Platelets 150 - 400 K/uL 211     Assessment and plan- Patient is a 70 y.o. 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  He is here for on treatment assessment prior to cycle 18 of palliative Keytruda  Counts okay to proceed with cycle 18 of palliative Keytruda today.  I will see him back in 3 weeks for cycle 18.  Plan to repeat scans in 4 to 5 weeks.     Visit Diagnosis 1. Encounter for antineoplastic immunotherapy   2. 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 Randa Evens, MD, MPH [facility] at [facility] [phone] 02/08/2021 12:25 PM

## 2021-02-08 NOTE — Patient Instructions (Signed)
[facility]   Discharge Instructions: Thank you for choosing [facility] to provide your oncology and hematology care.  If you have a lab appointment with the [facility], please go directly to the [facility] and check in at the registration area.  Wear comfortable clothing and clothing appropriate for easy access to any Portacath or PICC line.   We strive to give you quality time with your provider. You may need to reschedule your appointment if you arrive late (15 or more minutes).  Arriving late affects you and other patients whose appointments are after yours.  Also, if you miss three or more appointments without notifying the office, you may be dismissed from the clinic at the provider's discretion.      For prescription refill requests, have your pharmacy contact our office and allow 72 hours for refills to be completed.    Today you received the following chemotherapy and/or immunotherapy agents Keytruda  Pembrolizumab injection What is this medication? PEMBROLIZUMAB (pem broe liz ue mab) is a monoclonal antibody. It is used totreat certain types of cancer. This medicine may be used for other purposes; ask your health care provider orpharmacist if you have questions. COMMON BRAND NAME(S): Keytruda What should I tell my care team before I take this medication? They need to know if you have any of these conditions: autoimmune diseases like Crohn's disease, ulcerative colitis, or lupus have had or planning to have an allogeneic stem cell transplant (uses someone else's stem cells) history of organ transplant history of chest radiation nervous system problems like myasthenia gravis or Guillain-Barre syndrome an unusual or allergic reaction to pembrolizumab, other medicines, foods, dyes, or preservatives pregnant or trying to get pregnant breast-feeding How should I use this medication? This medicine is for infusion into a vein.  It is given by a health careprofessional in a hospital or clinic setting. A special MedGuide will be given to you before each treatment. Be sure to readthis information carefully each time. Talk to your pediatrician regarding the use of this medicine in children. While this drug may be prescribed for children as young as 6 months for selectedconditions, precautions do apply. Overdosage: If you think you have taken too much of this medicine contact apoison control center or emergency room at once. NOTE: This medicine is only for you. Do not share this medicine with others. What if I miss a dose? It is important not to miss your dose. Call your doctor or health careprofessional if you are unable to keep an appointment. What may interact with this medication? Interactions have not been studied. This list may not describe all possible interactions. Give your health care provider a list of all the medicines, herbs, non-prescription drugs, or dietary supplements you use. Also tell them if you smoke, drink alcohol, or use illegaldrugs. Some items may interact with your medicine. What should I watch for while using this medication? Your condition will be monitored carefully while you are receiving thismedicine. You may need blood work done while you are taking this medicine. Do not become pregnant while taking this medicine or for 4 months after stopping it. Women should inform their doctor if they wish to become pregnant or think they might be pregnant. There is a potential for serious side effects to an unborn child. Talk to your health care professional or pharmacist for more information. Do not breast-feed an infant while taking this medicine orfor 4 months after the last dose. What side  effects may I notice from receiving this medication? Side effects that you should report to your doctor or health care professionalas soon as possible: allergic reactions like skin rash, itching or hives, swelling of the  face, lips, or tongue bloody or black, tarry breathing problems changes in vision chest pain chills confusion constipation cough diarrhea dizziness or feeling faint or lightheaded fast or irregular heartbeat fever flushing joint pain low blood counts - this medicine may decrease the number of white blood cells, red blood cells and platelets. You may be at increased risk for infections and bleeding. muscle pain muscle weakness pain, tingling, numbness in the hands or feet persistent headache redness, blistering, peeling or loosening of the skin, including inside the mouth signs and symptoms of high blood sugar such as dizziness; dry mouth; dry skin; fruity breath; nausea; stomach pain; increased hunger or thirst; increased urination signs and symptoms of kidney injury like trouble passing urine or change in the amount of urine signs and symptoms of liver injury like dark urine, light-colored stools, loss of appetite, nausea, right upper belly pain, yellowing of the eyes or skin sweating swollen lymph nodes weight loss Side effects that usually do not require medical attention (report to yourdoctor or health care professional if they continue or are bothersome): decreased appetite hair loss tiredness This list may not describe all possible side effects. Call your doctor for medical advice about side effects. You may report side effects to FDA at1-800-FDA-1088. Where should I keep my medication? This drug is given in a hospital or clinic and will not be stored at home. NOTE: This sheet is a summary. It may not cover all possible information. If you have questions about this medicine, talk to your doctor, pharmacist, orhealth care provider.  2022 Elsevier/Gold Standard (2019-07-16 21:44:53)       To help prevent nausea and vomiting after your treatment, we encourage you to take your nausea medication as directed.  BELOW ARE SYMPTOMS THAT SHOULD BE REPORTED IMMEDIATELY: *FEVER  GREATER THAN 100.4 F (38 C) OR HIGHER *CHILLS OR SWEATING *NAUSEA AND VOMITING THAT IS NOT CONTROLLED WITH YOUR NAUSEA MEDICATION *UNUSUAL SHORTNESS OF BREATH *UNUSUAL BRUISING OR BLEEDING *URINARY PROBLEMS (pain or burning when urinating, or frequent urination) *BOWEL PROBLEMS (unusual diarrhea, constipation, pain near the anus) TENDERNESS IN MOUTH AND THROAT WITH OR WITHOUT PRESENCE OF ULCERS (sore throat, sores in mouth, or a toothache) UNUSUAL RASH, SWELLING OR PAIN  UNUSUAL VAGINAL DISCHARGE OR ITCHING   Items with * indicate a potential emergency and should be followed up as soon as possible or go to the Emergency Department if any problems should occur.  Please show the CHEMOTHERAPY ALERT CARD or IMMUNOTHERAPY ALERT CARD at check-in to the Emergency Department and triage nurse.  Should you have questions after your visit or need to cancel or reschedule your appointment, please contact [facility]  [phone] and follow the prompts.  Office hours are 8:00 a.m. to 4:30 p.m. Monday - Friday. Please note that voicemails left after 4:00 p.m. may not be returned until the following business day.  We are closed weekends and major holidays. You have access to a nurse at all times for urgent questions. Please call the main number to the clinic [phone] and follow the prompts.  For any non-urgent questions, you may also contact your provider using MyChart. We now offer e-Visits for anyone 66 and older to request care online for non-urgent symptoms. For details visit [email or web address].   Also download  the MyChart app! Go to the app store, search "MyChart", open the app, select , and log in with your MyChart username and password.  Due to Covid, a mask is required upon entering the hospital/clinic. If you do not have a mask, one will be given to you upon arrival. For doctor visits, patients may have 1 support person aged 66 or older with  them. For treatment visits, patients cannot have anyone with them due to current Covid guidelines and our immunocompromised population.

## 2021-02-09 ENCOUNTER — Other Ambulatory Visit: Payer: Self-pay | Admitting: *Deleted

## 2021-02-09 DIAGNOSIS — C099 Malignant neoplasm of tonsil, unspecified: Secondary | ICD-10-CM

## 2021-02-09 NOTE — Addendum Note (Signed)
Addended by: Luella Cook on: 02/09/2021 09:03 AM   Modules accepted: Orders

## 2021-03-01 ENCOUNTER — Inpatient Hospital Stay: Payer: Medicare Other

## 2021-03-01 ENCOUNTER — Encounter: Payer: Self-pay | Admitting: Oncology

## 2021-03-01 ENCOUNTER — Inpatient Hospital Stay: Payer: Medicare Other | Attending: Oncology | Admitting: Oncology

## 2021-03-01 ENCOUNTER — Other Ambulatory Visit: Payer: Medicare Other

## 2021-03-01 VITALS — BP 120/84 | HR 72 | Temp 97.3°F | Resp 18 | Wt 194.0 lb

## 2021-03-01 DIAGNOSIS — C099 Malignant neoplasm of tonsil, unspecified: Secondary | ICD-10-CM

## 2021-03-01 DIAGNOSIS — C109 Malignant neoplasm of oropharynx, unspecified: Secondary | ICD-10-CM | POA: Insufficient documentation

## 2021-03-01 DIAGNOSIS — Z5112 Encounter for antineoplastic immunotherapy: Secondary | ICD-10-CM | POA: Diagnosis not present

## 2021-03-01 DIAGNOSIS — Z79899 Other long term (current) drug therapy: Secondary | ICD-10-CM | POA: Diagnosis not present

## 2021-03-01 DIAGNOSIS — C7800 Secondary malignant neoplasm of unspecified lung: Secondary | ICD-10-CM | POA: Insufficient documentation

## 2021-03-01 DIAGNOSIS — C771 Secondary and unspecified malignant neoplasm of intrathoracic lymph nodes: Secondary | ICD-10-CM | POA: Diagnosis not present

## 2021-03-01 LAB — CBC WITH DIFFERENTIAL/PLATELET
Abs Immature Granulocytes: 0.01 10*3/uL (ref 0.00–0.07)
Basophils Absolute: 0 10*3/uL (ref 0.0–0.1)
Basophils Relative: 1 %
Eosinophils Absolute: 0.3 10*3/uL (ref 0.0–0.5)
Eosinophils Relative: 6 %
HCT: 40.2 % (ref 39.0–52.0)
Hemoglobin: 13.3 g/dL (ref 13.0–17.0)
Immature Granulocytes: 0 %
Lymphocytes Relative: 19 %
Lymphs Abs: 1.1 10*3/uL (ref 0.7–4.0)
MCH: 32.2 pg (ref 26.0–34.0)
MCHC: 33.1 g/dL (ref 30.0–36.0)
MCV: 97.3 fL (ref 80.0–100.0)
Monocytes Absolute: 0.5 10*3/uL (ref 0.1–1.0)
Monocytes Relative: 9 %
Neutro Abs: 3.8 10*3/uL (ref 1.7–7.7)
Neutrophils Relative %: 65 %
Platelets: 240 10*3/uL (ref 150–400)
RBC: 4.13 MIL/uL — ABNORMAL LOW (ref 4.22–5.81)
RDW: 13.1 % (ref 11.5–15.5)
WBC: 5.7 10*3/uL (ref 4.0–10.5)
nRBC: 0 % (ref 0.0–0.2)

## 2021-03-01 LAB — COMPREHENSIVE METABOLIC PANEL
ALT: 12 U/L (ref 0–44)
AST: 20 U/L (ref 15–41)
Albumin: 4.2 g/dL (ref 3.5–5.0)
Alkaline Phosphatase: 75 U/L (ref 38–126)
Anion gap: 9 (ref 5–15)
BUN: 17 mg/dL (ref 8–23)
CO2: 25 mmol/L (ref 22–32)
Calcium: 8.9 mg/dL (ref 8.9–10.3)
Chloride: 104 mmol/L (ref 98–111)
Creatinine, Ser: 0.89 mg/dL (ref 0.61–1.24)
GFR, Estimated: >60 mL/min (ref >60)
Glucose, Bld: 110 mg/dL — ABNORMAL HIGH (ref 70–99)
Potassium: 4 mmol/L (ref 3.5–5.1)
Sodium: 138 mmol/L (ref 135–145)
Total Bilirubin: 0.8 mg/dL (ref 0.3–1.2)
Total Protein: 7.4 g/dL (ref 6.5–8.1)

## 2021-03-01 MED ORDER — SODIUM CHLORIDE 0.9% FLUSH
10.0000 mL | Freq: Once | INTRAVENOUS | Status: AC
Start: 2021-03-01 — End: 2021-03-01
  Administered 2021-03-01: 10 mL via INTRAVENOUS
  Filled 2021-03-01: qty 10

## 2021-03-01 MED ORDER — HEPARIN SOD (PORK) LOCK FLUSH 100 UNIT/ML IV SOLN
INTRAVENOUS | Status: AC
  Filled 2021-03-01: qty 5

## 2021-03-01 MED ORDER — HEPARIN SOD (PORK) LOCK FLUSH 100 UNIT/ML IV SOLN
500.0000 [IU] | Freq: Once | INTRAVENOUS | Status: AC
  Administered 2021-03-01: 500 [IU] via INTRAVENOUS
  Filled 2021-03-01: qty 5

## 2021-03-01 MED ORDER — SODIUM CHLORIDE 0.9 % IV SOLN
Freq: Once | INTRAVENOUS | Status: AC
Start: 2021-03-01 — End: 2021-03-01
  Filled 2021-03-01: qty 250

## 2021-03-01 MED ORDER — SODIUM CHLORIDE 0.9 % IV SOLN
200.0000 mg | Freq: Once | INTRAVENOUS | Status: AC
  Administered 2021-03-01: 200 mg via INTRAVENOUS
  Filled 2021-03-01: qty 8

## 2021-03-01 NOTE — Progress Notes (Signed)
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 Richardson Landry for symptoms of sore throat and difficulty swallowing which she has been experiencing over the last 1 year.He underwent a comprehensive auto laryngoscopy exam which showed a firm enlarged right tonsillar mass with necrotic area extending onto the adjacent soft palate into the root of the uvula.  Hypopharynx and larynx as well as nasopharynx was unable to be visualized due to gag reflex.  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   PET CT scan showed large hypermetabolic right palatine tonsil mass which crosses the midline.  Ipsilateral cervical nodal metastases.  Bilateral pulmonary nodules including a dominant 1 cm hypermetabolic right lung base nodule.  Multiplicity favor pulmonary metastases.  Subcarinal and equivocal left hilar nodal metastases.  Hypermetabolism involving the ascending colon.  Question mild diverticulitis versus underlying colonic mass or polyp cannot be excluded     Patient underwent EBUS guided biopsy of hilar lymph nodes.  Biopsy showed squamous cell carcinoma.  However HPV/p16 status was uninterpretable.    Patient completed 6 cycles of carbotaxol chemotherapy along with Keytruda and is currently on maintenance Keytruda.  Scans show far for excellent response to  treatment  Interval history-patient is presently doing well and denies any specific complaints at this time.  Tolerating Keytruda well without any significant side effects  ECOG PS- 1 Pain scale- 0   Review of systems- Review of Systems  Constitutional:  Negative for chills, fever, malaise/fatigue and weight loss.  HENT:  Negative for congestion, ear discharge and nosebleeds.   Eyes:  Negative for blurred vision.  Respiratory:  Negative for cough, hemoptysis, sputum production, shortness of breath and wheezing.   Cardiovascular:  Negative for chest pain, palpitations, orthopnea and claudication.  Gastrointestinal:  Negative for abdominal pain, blood in stool, constipation, diarrhea, heartburn, melena, nausea and vomiting.  Genitourinary:  Negative for dysuria, flank pain, frequency, hematuria and urgency.  Musculoskeletal:  Negative for back pain, joint pain and myalgias.  Skin:  Negative for rash.  Neurological:  Negative for dizziness, tingling, focal weakness, seizures, weakness and headaches.  Endo/Heme/Allergies:  Does not bruise/bleed easily.  Psychiatric/Behavioral:  Negative for depression and suicidal ideas. The patient does not have insomnia.      No Known Allergies   Past Medical History:  Diagnosis Date   Asthma    Lung nodules    Metastasis to lung (HCC)    tonsil cancer and mets to lung   Tonsil cancer ([place])    Tonsil cancer [facility])      Past Surgical History:  Procedure Laterality Date   CERVICAL SPINE SURGERY     LUMBAR SPINE SURGERY     PORTA CATH INSERTION N/A 10/08/2019   Procedure: PORTA CATH INSERTION;  Surgeon: Katha Cabal, MD;  Location: [facility];  Service: Cardiovascular;  Laterality: N/A;   VIDEO BRONCHOSCOPY WITH ENDOBRONCHIAL NAVIGATION N/A 10/10/2019  Procedure: VIDEO BRONCHOSCOPY WITH ENDOBRONCHIAL NAVIGATION;  Surgeon: Tyler Pita, MD;  Location: [facility] ORS;  Service: Pulmonary;  Laterality: N/A;   VIDEO BRONCHOSCOPY WITH  ENDOBRONCHIAL ULTRASOUND N/A 10/10/2019   Procedure: VIDEO BRONCHOSCOPY WITH ENDOBRONCHIAL ULTRASOUND;  Surgeon: Tyler Pita, MD;  Location: [facility] ORS;  Service: Pulmonary;  Laterality: N/A;    Social History   Socioeconomic History   Marital status: Widowed    Spouse name: Not on file   Number of children: Not on file   Years of education: Not on file   Highest education level: Not on file  Occupational History   Not on file  Tobacco Use   Smoking status: Never   Smokeless tobacco: Never  Vaping Use   Vaping Use: Never used  Substance and Sexual Activity   Alcohol use: Never   Drug use: Never   Sexual activity: Not Currently  Other Topics Concern   Not on file  Social History Narrative   Not on file   Social Determinants of Health   Financial Resource Strain: Not on file  Food Insecurity: Not on file  Transportation Needs: Not on file  Physical Activity: Not on file  Stress: Not on file  Social Connections: Not on file  Intimate Partner Violence: Not on file    Family History  Problem Relation Age of Onset   Cancer Mother 35       not sure what type   Prostate cancer Father    Rheum arthritis Father    Healthy Sister    Healthy Brother    Healthy Sister    Healthy Brother    Healthy Brother    Heart disease Brother    Heart disease Brother      Current Outpatient Medications:    acetaminophen (TYLENOL) 500 MG tablet, Take 500-1,000 mg by mouth every 8 (eight) hours as needed (for pain.). , Disp: , Rfl:    oxyCODONE (OXY IR/ROXICODONE) 5 MG immediate release tablet, Take 1 tablet (5 mg total) by mouth every 4 (four) hours as needed for severe pain., Disp: 60 tablet, Rfl: 0   SYMBICORT 160-4.5 MCG/ACT inhaler, Inhale 2 puffs into the lungs 2 (two) times daily., Disp: 1 each, Rfl: 2   EPINEPHrine (PRIMATENE MIST IN), Inhale 2 puffs into the lungs as needed (wheezing or sob). (Patient not taking: Reported on 03/01/2021), Disp: , Rfl:    lidocaine-prilocaine  (EMLA) cream, Apply to affected area once (Patient not taking: No sig reported), Disp: 30 g, Rfl: 3   LORazepam (ATIVAN) 0.5 MG tablet, Take 1 tablet (0.5 mg total) by mouth every 6 (six) hours as needed (Nausea or vomiting). (Patient not taking: No sig reported), Disp: 30 tablet, Rfl: 0   OLANZapine (ZYPREXA) 10 MG tablet, Take 1 tablet (10 mg total) by mouth at bedtime. (Patient not taking: No sig reported), Disp: 30 tablet, Rfl: 2   ondansetron (ZOFRAN) 8 MG tablet, Take 1 tablet (8 mg total) by mouth 2 (two) times daily as needed. Start on the third day after chemotherapy. (Patient not taking: No sig reported), Disp: 30 tablet, Rfl: 1   prochlorperazine (COMPAZINE) 10 MG tablet, Take 1 tablet (10 mg total) by mouth every 6 (six) hours as needed (Nausea or vomiting). (Patient not taking: No sig reported), Disp: 30 tablet, Rfl: 1 No current facility-administered medications for this visit.  Facility-Administered Medications Ordered in Other Visits:    sodium chloride flush (NS) 0.9 % injection 10 mL, 10 mL, Intravenous, PRN, Randa Evens  C, MD, 10 mL at 03/09/20 0919  Physical exam:  Vitals:   03/01/21 1107  BP: 120/84  Pulse: 72  Resp: 18  Temp: (!) 97.3 F (36.3 C)  TempSrc: Tympanic  SpO2: 99%  Weight: 194 lb (88 kg)   Physical Exam Constitutional:      General: He is not in acute distress. Cardiovascular:     Rate and Rhythm: Normal rate and regular rhythm.     Heart sounds: Normal heart sounds.  Pulmonary:     Effort: Pulmonary effort is normal.     Breath sounds: Normal breath sounds.  Abdominal:     General: Bowel sounds are normal.     Palpations: Abdomen is soft.  Skin:    General: Skin is warm and dry.  Neurological:     Mental Status: He is alert and oriented to person, place, and time.     CMP Latest Ref Rng & Units 03/01/2021  Glucose 70 - 99 mg/dL 110(H)  BUN 8 - 23 mg/dL 17  Creatinine 0.61 - 1.24 mg/dL 0.89  Sodium 135 - 145 mmol/L 138  Potassium 3.5 - 5.1  mmol/L 4.0  Chloride 98 - 111 mmol/L 104  CO2 22 - 32 mmol/L 25  Calcium 8.9 - 10.3 mg/dL 8.9  Total Protein 6.5 - 8.1 g/dL 7.4  Total Bilirubin 0.3 - 1.2 mg/dL 0.8  Alkaline Phos 38 - 126 U/L 75  AST 15 - 41 U/L 20  ALT 0 - 44 U/L 12   CBC Latest Ref Rng & Units 03/01/2021  WBC 4.0 - 10.5 K/uL 5.7  Hemoglobin 13.0 - 17.0 g/dL 13.3  Hematocrit 39.0 - 52.0 % 40.2  Platelets 150 - 400 K/uL 240     Assessment and plan- Patient is a 70 y.o. 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  He is here for on treatment assessment prior to cycle 19 of palliative Keytruda  Counts okay to proceed with cycle 19 of palliative Keytruda today.  He will be undergoing repeat scans tomorrow.  I will see him back in 3 weeks for cycle 20.  Plan is to continue treatment until progression or toxicity.   Visit Diagnosis 1. Encounter for antineoplastic immunotherapy   2. 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 Randa Evens, MD, MPH [facility] at [facility] [phone] 03/01/2021 4:15 PM

## 2021-03-01 NOTE — Patient Instructions (Signed)
[facility]  Discharge Instructions: Thank you for choosing [facility] to provide your oncology and hematology care.  If you have a lab appointment with the [facility], please go directly to the [facility] and check in at the registration area.  Wear comfortable clothing and clothing appropriate for easy access to any Portacath or PICC line.   We strive to give you quality time with your provider. You may need to reschedule your appointment if you arrive late (15 or more minutes).  Arriving late affects you and other patients whose appointments are after yours.  Also, if you miss three or more appointments without notifying the office, you may be dismissed from the clinic at the provider's discretion.      For prescription refill requests, have your pharmacy contact our office and allow 72 hours for refills to be completed.    Today you received the following chemotherapy and/or immunotherapy agents: Keytruda      To help prevent nausea and vomiting after your treatment, we encourage you to take your nausea medication as directed.  BELOW ARE SYMPTOMS THAT SHOULD BE REPORTED IMMEDIATELY: *FEVER GREATER THAN 100.4 F (38 C) OR HIGHER *CHILLS OR SWEATING *NAUSEA AND VOMITING THAT IS NOT CONTROLLED WITH YOUR NAUSEA MEDICATION *UNUSUAL SHORTNESS OF BREATH *UNUSUAL BRUISING OR BLEEDING *URINARY PROBLEMS (pain or burning when urinating, or frequent urination) *BOWEL PROBLEMS (unusual diarrhea, constipation, pain near the anus) TENDERNESS IN MOUTH AND THROAT WITH OR WITHOUT PRESENCE OF ULCERS (sore throat, sores in mouth, or a toothache) UNUSUAL RASH, SWELLING OR PAIN  UNUSUAL VAGINAL DISCHARGE OR ITCHING   Items with * indicate a potential emergency and should be followed up as soon as possible or go to the Emergency Department if any problems should occur.  Please show the CHEMOTHERAPY ALERT CARD or IMMUNOTHERAPY ALERT CARD at check-in  to the Emergency Department and triage nurse.  Should you have questions after your visit or need to cancel or reschedule your appointment, please contact [facility]  [phone] and follow the prompts.  Office hours are 8:00 a.m. to 4:30 p.m. Monday - Friday. Please note that voicemails left after 4:00 p.m. may not be returned until the following business day.  We are closed weekends and major holidays. You have access to a nurse at all times for urgent questions. Please call the main number to the clinic [phone] and follow the prompts.  For any non-urgent questions, you may also contact your provider using MyChart. We now offer e-Visits for anyone 9 and older to request care online for non-urgent symptoms. For details visit [email or web address].   Also download the MyChart app! Go to the app store, search "MyChart", open the app, select [facility], and log in with your MyChart username and password.  Due to Covid, a mask is required upon entering the hospital/clinic. If you do not have a mask, one will be given to you upon arrival. For doctor visits, patients may have 1 support person aged 40 or older with them. For treatment visits, patients cannot have anyone with them due to current Covid guidelines and our immunocompromised population.

## 2021-03-02 ENCOUNTER — Other Ambulatory Visit: Payer: Self-pay

## 2021-03-02 ENCOUNTER — Ambulatory Visit
Admission: RE | Admit: 2021-03-02 | Discharge: 2021-03-02 | Disposition: A | Discharge status: Home / Self Care | Payer: Medicare Other | Source: Ambulatory Visit | Attending: Oncology | Admitting: Oncology

## 2021-03-02 DIAGNOSIS — R911 Solitary pulmonary nodule: Secondary | ICD-10-CM | POA: Diagnosis not present

## 2021-03-02 DIAGNOSIS — Z5112 Encounter for antineoplastic immunotherapy: Secondary | ICD-10-CM

## 2021-03-02 DIAGNOSIS — N400 Enlarged prostate without lower urinary tract symptoms: Secondary | ICD-10-CM | POA: Diagnosis not present

## 2021-03-02 DIAGNOSIS — C099 Malignant neoplasm of tonsil, unspecified: Secondary | ICD-10-CM | POA: Diagnosis present

## 2021-03-02 DIAGNOSIS — K4090 Unilateral inguinal hernia, without obstruction or gangrene, not specified as recurrent: Secondary | ICD-10-CM | POA: Insufficient documentation

## 2021-03-02 DIAGNOSIS — N200 Calculus of kidney: Secondary | ICD-10-CM | POA: Diagnosis not present

## 2021-03-02 DIAGNOSIS — I700 Atherosclerosis of aorta: Secondary | ICD-10-CM | POA: Diagnosis not present

## 2021-03-02 DIAGNOSIS — I2510 Atherosclerotic heart disease of native coronary artery without angina pectoris: Secondary | ICD-10-CM | POA: Insufficient documentation

## 2021-03-08 ENCOUNTER — Ambulatory Visit: Payer: Medicare Other

## 2021-03-08 IMAGING — CT CT CHEST SUPER D W/O CM
2 of 5 series · 15 of 36 positions shown, 18 images · non-contrast
Comparison: PET exam of 09/30/2019

CLINICAL DATA: Tonsillar cancer with recent PET scan showing
hypermetabolic pulmonary nodules, scheduled for bronchoscopy.

EXAM:
CT CHEST WITHOUT CONTRAST
TECHNIQUE: Multidetector CT imaging of the chest was performed using thin slice
collimation for electromagnetic bronchoscopy planning purposes,
without intravenous contrast.

[Series 4: thins chest 1.00 · axial · 0.72mm/px · z∈[-1203,-899]mm · 12 of 480 slices shown, 15 images]
[im 23/480  mediastinal]
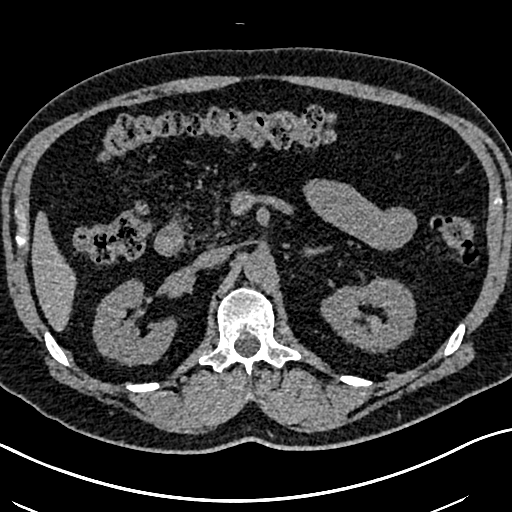
[im 23/480  lung]
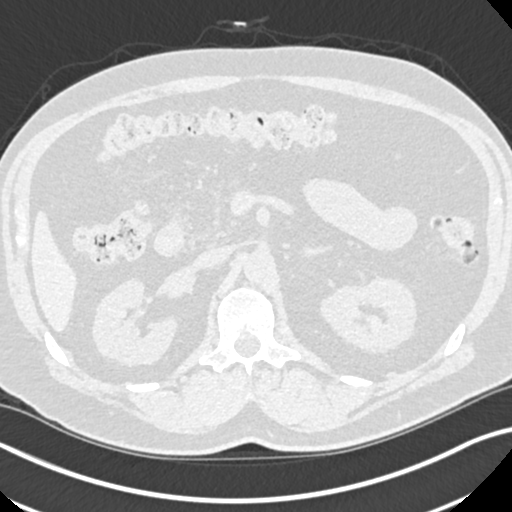
[im 69/480  lung]
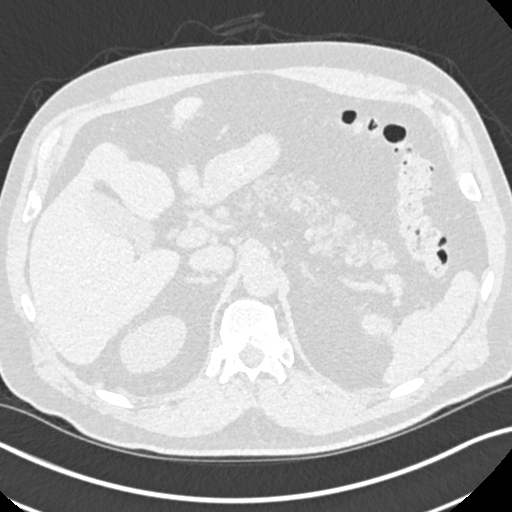
[im 115/480  lung]
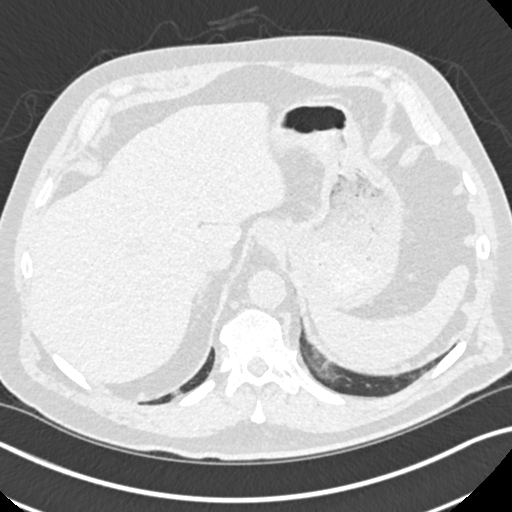
[im 137/480  lung]
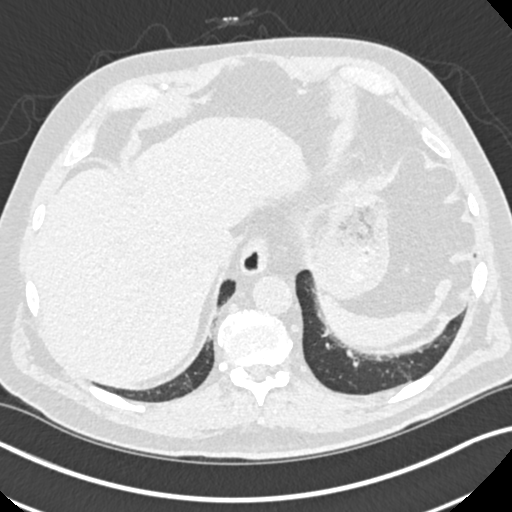
[im 183/480  mediastinal]
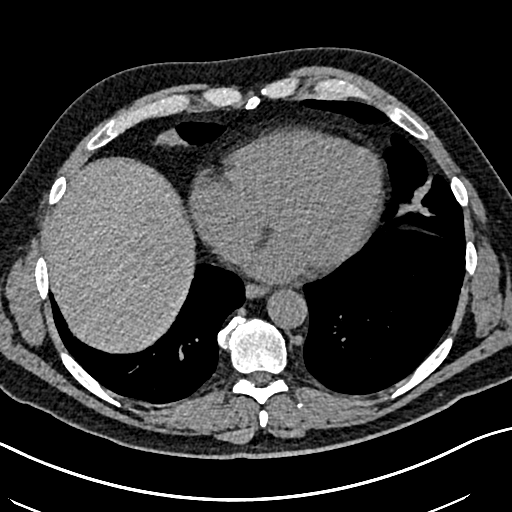
[im 183/480  lung]
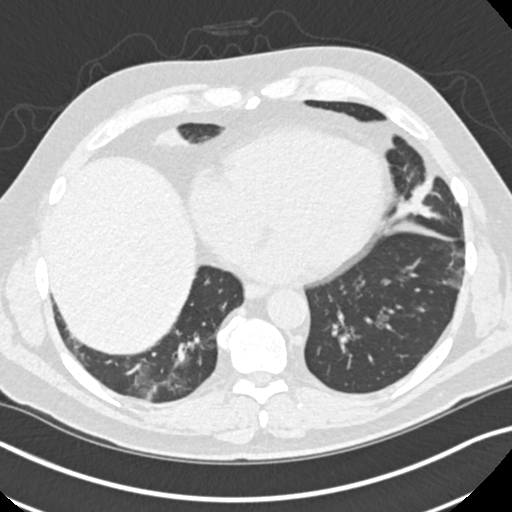
[im 229/480  lung]
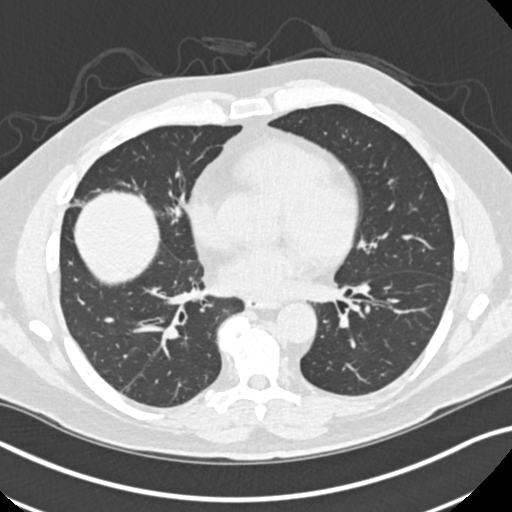
[im 251/480  lung]
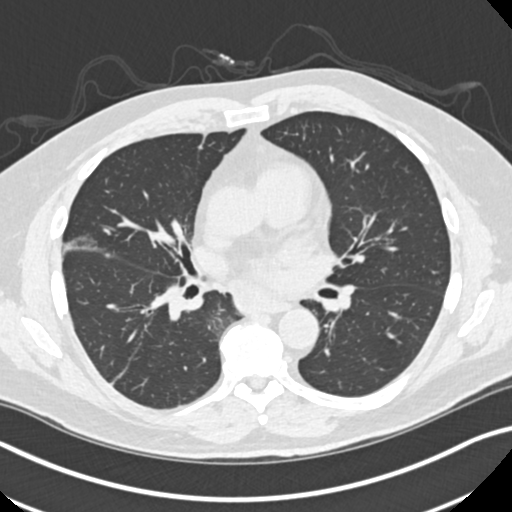
[im 297/480  lung]
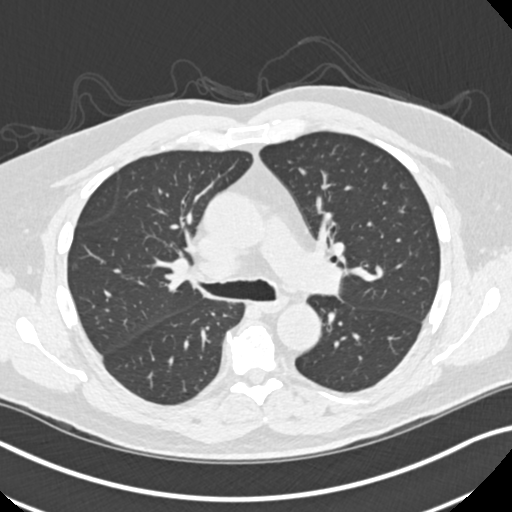
[im 343/480  mediastinal]
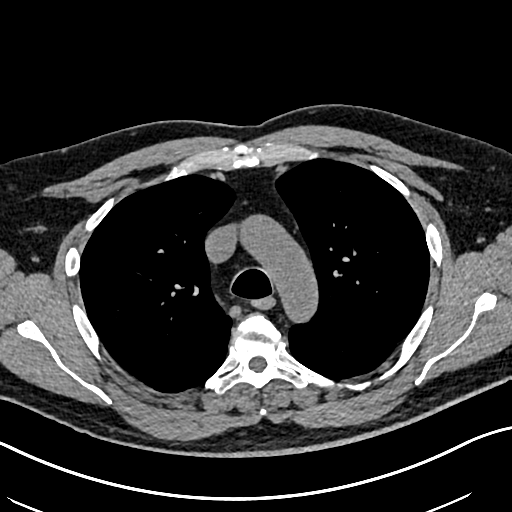
[im 343/480  lung]
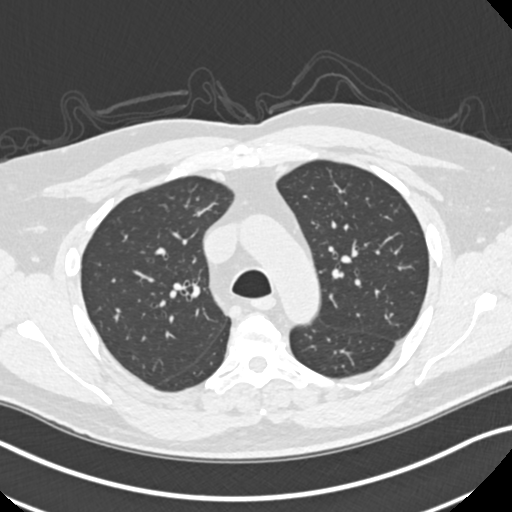
[im 365/480  lung]
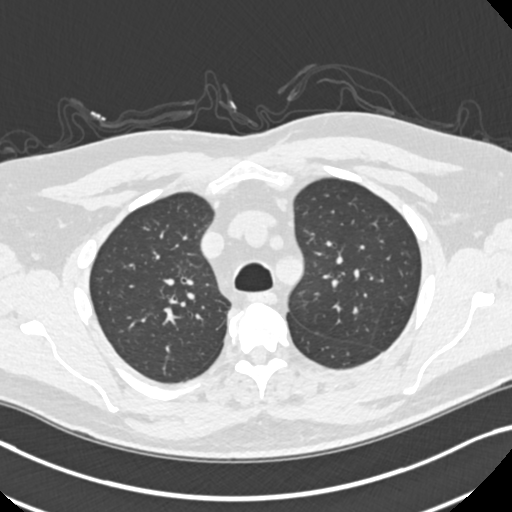
[im 411/480  lung]
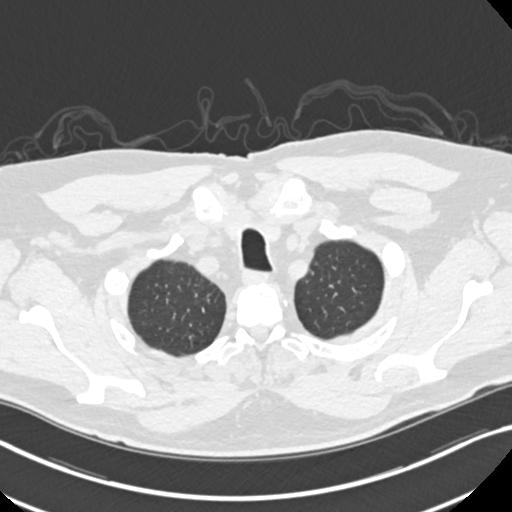
[im 457/480  lung]
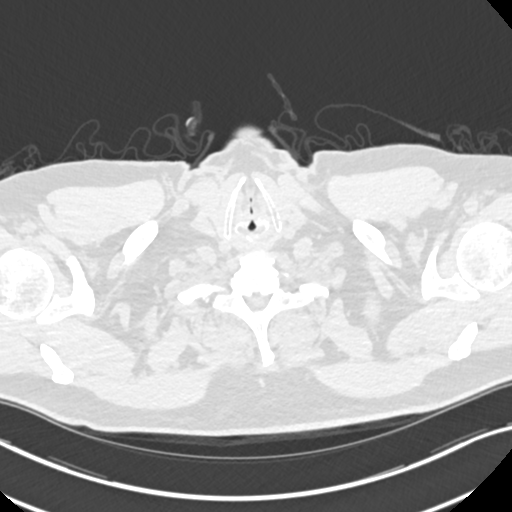

[Series 5: coronals chest 2.00 cor · coronal · 0.66mm/px · 3 of 149 slices shown]
[im 30/149  lung]
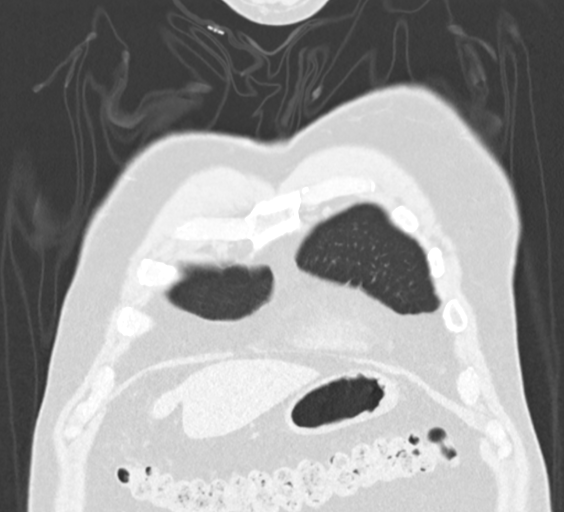
[im 60/149  lung]
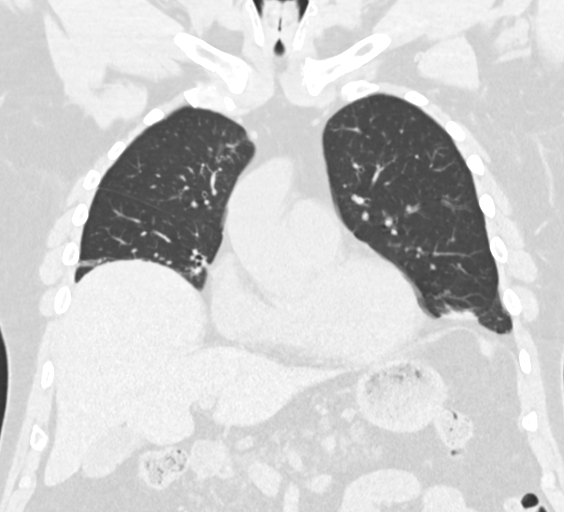
[im 89/149  lung]
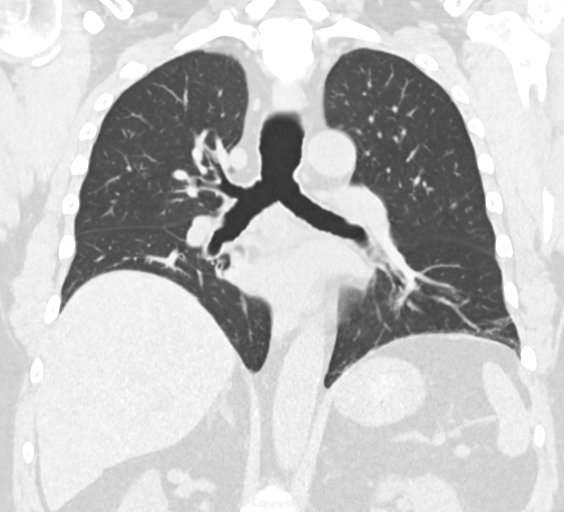

[15 of 36 positions shown; findings below may reference images not displayed]

FINDINGS: Cardiovascular: Minimal calcified atherosclerosis of the thoracic
aorta. Heart size normal without pericardial effusion. Central
pulmonary vasculature is unremarkable on noncontrast imaging, not
well assessed.

Mediastinum/Nodes: Low neck, right level 2 lymph nodes are partially
imaged, 1.6 cm area of soft tissue behind the right
sternocleidomastoid muscle corresponding to this abnormality along
its inferior margin better demonstrated on the recent PET exam.

Bulky subcarinal lymphadenopathy largest at 2.4 cm short axis.

Mildly enlarged left perihilar nodal tissue approximately 1.2 cm
(image 29, series 2)

No sign of right paratracheal lymphadenopathy or anterior
mediastinal adenopathy.

Lungs/Pleura: Scattered pulmonary nodules largest at the right lung
base measuring 1 x 1 cm (image 34, series 3) this is immediately
along the fissure in the anterior right lower lobe along the right
hemidiaphragm.

Small juxtapleural nodule in the left chest (image 44, series 3) 8
mm. Similarly sized nodule seen in the periphery of the superior
segment of the left lower lobe.

Small pulmonary nodule in the right upper lobe (image 24, series 3)
6 mm. No signs of pleural effusion. No dense consolidation.

Also with small nodule in in the right lower lobe approximately 5 mm
in the superior segment.

Upper Abdomen: Lobular hepatic contours. Nonspecific finding. No
signs of splenomegaly.

Fatty replacement of much of pancreatic parenchyma.

Adrenal glands are normal. No acute findings in the upper abdomen.

Musculoskeletal:
IMPRESSION: 1. Scattered pulmonary nodules, largest at the right lung base
measuring 1 x 1 cm. Findings remain concerning for metastatic
disease.
2. Bulky subcarinal and mild left perihilar adenopathy, also likely
due to metastatic disease.
3. Low neck, right level 2 lymph nodes are partially imaged, better
demonstrated on the recent PET exam.

Aortic Atherosclerosis ([id number]).

## 2021-03-22 ENCOUNTER — Inpatient Hospital Stay: Payer: Medicare Other

## 2021-03-22 ENCOUNTER — Encounter: Payer: Self-pay | Admitting: Oncology

## 2021-03-22 ENCOUNTER — Inpatient Hospital Stay: Payer: Medicare Other | Admitting: Oncology

## 2021-03-22 ENCOUNTER — Other Ambulatory Visit: Payer: Self-pay | Admitting: *Deleted

## 2021-03-22 VITALS — BP 128/70 | HR 66 | Temp 97.4°F | Resp 16 | Wt 197.7 lb

## 2021-03-22 DIAGNOSIS — C099 Malignant neoplasm of tonsil, unspecified: Secondary | ICD-10-CM | POA: Diagnosis not present

## 2021-03-22 DIAGNOSIS — Z5112 Encounter for antineoplastic immunotherapy: Secondary | ICD-10-CM

## 2021-03-22 LAB — CBC WITH DIFFERENTIAL/PLATELET
Abs Immature Granulocytes: 0.03 10*3/uL (ref 0.00–0.07)
Basophils Absolute: 0 10*3/uL (ref 0.0–0.1)
Basophils Relative: 1 %
Eosinophils Absolute: 0.5 10*3/uL (ref 0.0–0.5)
Eosinophils Relative: 6 %
HCT: 39.8 % (ref 39.0–52.0)
Hemoglobin: 13.6 g/dL (ref 13.0–17.0)
Immature Granulocytes: 0 %
Lymphocytes Relative: 20 %
Lymphs Abs: 1.5 10*3/uL (ref 0.7–4.0)
MCH: 33.4 pg (ref 26.0–34.0)
MCHC: 34.2 g/dL (ref 30.0–36.0)
MCV: 97.8 fL (ref 80.0–100.0)
Monocytes Absolute: 0.6 10*3/uL (ref 0.1–1.0)
Monocytes Relative: 8 %
Neutro Abs: 4.9 10*3/uL (ref 1.7–7.7)
Neutrophils Relative %: 65 %
Platelets: 236 10*3/uL (ref 150–400)
RBC: 4.07 MIL/uL — ABNORMAL LOW (ref 4.22–5.81)
RDW: 12.7 % (ref 11.5–15.5)
WBC: 7.6 10*3/uL (ref 4.0–10.5)
nRBC: 0 % (ref 0.0–0.2)

## 2021-03-22 LAB — COMPREHENSIVE METABOLIC PANEL
ALT: 12 U/L (ref 0–44)
AST: 20 U/L (ref 15–41)
Albumin: 4 g/dL (ref 3.5–5.0)
Alkaline Phosphatase: 76 U/L (ref 38–126)
Anion gap: 12 (ref 5–15)
BUN: 18 mg/dL (ref 8–23)
CO2: 24 mmol/L (ref 22–32)
Calcium: 9.2 mg/dL (ref 8.9–10.3)
Chloride: 104 mmol/L (ref 98–111)
Creatinine, Ser: 1.04 mg/dL (ref 0.61–1.24)
GFR, Estimated: >60 mL/min (ref >60)
Glucose, Bld: 103 mg/dL — ABNORMAL HIGH (ref 70–99)
Potassium: 3.8 mmol/L (ref 3.5–5.1)
Sodium: 140 mmol/L (ref 135–145)
Total Bilirubin: 0.5 mg/dL (ref 0.3–1.2)
Total Protein: 7.2 g/dL (ref 6.5–8.1)

## 2021-03-22 MED ORDER — OXYCODONE HCL 5 MG PO TABS: 5.0000 mg | ORAL_TABLET | ORAL | 0 refills | Status: DC | PRN

## 2021-03-22 MED ORDER — HEPARIN SOD (PORK) LOCK FLUSH 100 UNIT/ML IV SOLN
INTRAVENOUS | Status: AC
  Filled 2021-03-22: qty 5

## 2021-03-22 MED ORDER — SODIUM CHLORIDE 0.9 % IV SOLN
Freq: Once | INTRAVENOUS | Status: AC
Start: 2021-03-22 — End: 2021-03-22
  Filled 2021-03-22: qty 250

## 2021-03-22 MED ORDER — SODIUM CHLORIDE 0.9 % IV SOLN
200.0000 mg | Freq: Once | INTRAVENOUS | Status: AC
  Administered 2021-03-22: 200 mg via INTRAVENOUS
  Filled 2021-03-22: qty 8

## 2021-03-22 MED ORDER — PROCHLORPERAZINE EDISYLATE 10 MG/2ML IJ SOLN
10.0000 mg | Freq: Once | INTRAMUSCULAR | Status: AC
  Administered 2021-03-22: 10 mg via INTRAVENOUS
  Filled 2021-03-22: qty 2

## 2021-03-22 MED ORDER — HEPARIN SOD (PORK) LOCK FLUSH 100 UNIT/ML IV SOLN
500.0000 [IU] | Freq: Once | INTRAVENOUS | Status: AC | PRN
  Administered 2021-03-22: 500 [IU]
  Filled 2021-03-22: qty 5

## 2021-03-22 NOTE — Patient Instructions (Signed)
[facility]  Discharge Instructions: Thank you for choosing [facility] to provide your oncology and hematology care.  If you have a lab appointment with the [facility], please go directly to the [facility] and check in at the registration area.  Wear comfortable clothing and clothing appropriate for easy access to any Portacath or PICC line.   We strive to give you quality time with your provider. You may need to reschedule your appointment if you arrive late (15 or more minutes).  Arriving late affects you and other patients whose appointments are after yours.  Also, if you miss three or more appointments without notifying the office, you may be dismissed from the clinic at the provider's discretion.      For prescription refill requests, have your pharmacy contact our office and allow 72 hours for refills to be completed.    Today you received the following chemotherapy and/or immunotherapy agents : Marcelino Freestone   To help prevent nausea and vomiting after your treatment, we encourage you to take your nausea medication as directed.  BELOW ARE SYMPTOMS THAT SHOULD BE REPORTED IMMEDIATELY: *FEVER GREATER THAN 100.4 F (38 C) OR HIGHER *CHILLS OR SWEATING *NAUSEA AND VOMITING THAT IS NOT CONTROLLED WITH YOUR NAUSEA MEDICATION *UNUSUAL SHORTNESS OF BREATH *UNUSUAL BRUISING OR BLEEDING *URINARY PROBLEMS (pain or burning when urinating, or frequent urination) *BOWEL PROBLEMS (unusual diarrhea, constipation, pain near the anus) TENDERNESS IN MOUTH AND THROAT WITH OR WITHOUT PRESENCE OF ULCERS (sore throat, sores in mouth, or a toothache) UNUSUAL RASH, SWELLING OR PAIN  UNUSUAL VAGINAL DISCHARGE OR ITCHING   Items with * indicate a potential emergency and should be followed up as soon as possible or go to the Emergency Department if any problems should occur.  Please show the CHEMOTHERAPY ALERT CARD or IMMUNOTHERAPY ALERT CARD at check-in to  the Emergency Department and triage nurse.  Should you have questions after your visit or need to cancel or reschedule your appointment, please contact [facility]  [phone] and follow the prompts.  Office hours are 8:00 a.m. to 4:30 p.m. Monday - Friday. Please note that voicemails left after 4:00 p.m. may not be returned until the following business day.  We are closed weekends and major holidays. You have access to a nurse at all times for urgent questions. Please call the main number to the clinic [phone] and follow the prompts.  For any non-urgent questions, you may also contact your provider using MyChart. We now offer e-Visits for anyone 16 and older to request care online for non-urgent symptoms. For details visit [email or web address].   Also download the MyChart app! Go to the app store, search "MyChart", open the app, select [facility], and log in with your MyChart username and password.  Due to Covid, a mask is required upon entering the hospital/clinic. If you do not have a mask, one will be given to you upon arrival. For doctor visits, patients may have 1 support person aged 36 or older with them. For treatment visits, patients cannot have anyone with them due to current Covid guidelines and our immunocompromised population.

## 2021-03-22 NOTE — Progress Notes (Signed)
Pt has no concerns and here to get results of scan . And needs refill of pain med

## 2021-03-22 NOTE — Progress Notes (Signed)
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 Richardson Landry for symptoms of sore throat and difficulty swallowing which she has been experiencing over the last 1 year.He underwent a comprehensive auto laryngoscopy exam which showed a firm enlarged right tonsillar mass with necrotic area extending onto the adjacent soft palate into the root of the uvula.  Hypopharynx and larynx as well as nasopharynx was unable to be visualized due to gag reflex.  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   PET CT scan showed large hypermetabolic right palatine tonsil mass which crosses the midline.  Ipsilateral cervical nodal metastases.  Bilateral pulmonary nodules including a dominant 1 cm hypermetabolic right lung base nodule.  Multiplicity favor pulmonary metastases.  Subcarinal and equivocal left hilar nodal metastases.  Hypermetabolism involving the ascending colon.  Question mild diverticulitis versus underlying colonic mass or polyp cannot be excluded     Patient underwent EBUS guided biopsy of hilar lymph nodes.  Biopsy showed squamous cell carcinoma.  However HPV/p16 status was uninterpretable.    Patient completed 6 cycles of carbotaxol chemotherapy along with Keytruda and is currently on maintenance Keytruda.  Interval history-patient reports doing well and  denies any specific complaints at this time.  Has chronic nonmalignant back pain for which she uses oxycodone sparingly  ECOG PS- 1 Pain scale- 2   Review of systems- Review of Systems  Constitutional:  Negative for chills, fever, malaise/fatigue and weight loss.  HENT:  Negative for congestion, ear discharge and nosebleeds.   Eyes:  Negative for blurred vision.  Respiratory:  Negative for cough, hemoptysis, sputum production, shortness of breath and wheezing.   Cardiovascular:  Negative for chest pain, palpitations, orthopnea and claudication.  Gastrointestinal:  Negative for abdominal pain, blood in stool, constipation, diarrhea, heartburn, melena, nausea and vomiting.  Genitourinary:  Negative for dysuria, flank pain, frequency, hematuria and urgency.  Musculoskeletal:  Negative for back pain, joint pain and myalgias.  Skin:  Negative for rash.  Neurological:  Negative for dizziness, tingling, focal weakness, seizures, weakness and headaches.  Endo/Heme/Allergies:  Does not bruise/bleed easily.  Psychiatric/Behavioral:  Negative for depression and suicidal ideas. The patient does not have insomnia.      No Known Allergies   Past Medical History:  Diagnosis Date   Asthma    Lung nodules    Metastasis to lung (HCC)    tonsil cancer and mets to lung   Tonsil cancer ([place])    Tonsil cancer [facility])      Past Surgical History:  Procedure Laterality Date   CERVICAL SPINE SURGERY     LUMBAR SPINE SURGERY     PORTA CATH INSERTION N/A 10/08/2019   Procedure: PORTA CATH INSERTION;  Surgeon: Katha Cabal, MD;  Location: [facility];  Service: Cardiovascular;  Laterality: N/A;   VIDEO BRONCHOSCOPY WITH ENDOBRONCHIAL NAVIGATION N/A 10/10/2019   Procedure: VIDEO BRONCHOSCOPY WITH ENDOBRONCHIAL  NAVIGATION;  Surgeon: Tyler Pita, MD;  Location: [facility] ORS;  Service: Pulmonary;  Laterality: N/A;   VIDEO BRONCHOSCOPY WITH ENDOBRONCHIAL ULTRASOUND N/A 10/10/2019   Procedure:  VIDEO BRONCHOSCOPY WITH ENDOBRONCHIAL ULTRASOUND;  Surgeon: Tyler Pita, MD;  Location: [facility] ORS;  Service: Pulmonary;  Laterality: N/A;    Social History   Socioeconomic History   Marital status: Widowed    Spouse name: Not on file   Number of children: Not on file   Years of education: Not on file   Highest education level: Not on file  Occupational History   Not on file  Tobacco Use   Smoking status: Never   Smokeless tobacco: Never  Vaping Use   Vaping Use: Never used  Substance and Sexual Activity   Alcohol use: Never   Drug use: Never   Sexual activity: Not Currently  Other Topics Concern   Not on file  Social History Narrative   Not on file   Social Determinants of Health   Financial Resource Strain: Not on file  Food Insecurity: Not on file  Transportation Needs: Not on file  Physical Activity: Not on file  Stress: Not on file  Social Connections: Not on file  Intimate Partner Violence: Not on file    Family History  Problem Relation Age of Onset   Cancer Mother 63       not sure what type   Prostate cancer Father    Rheum arthritis Father    Healthy Sister    Healthy Brother    Healthy Sister    Healthy Brother    Healthy Brother    Heart disease Brother    Heart disease Brother      Current Outpatient Medications:    acetaminophen (TYLENOL) 500 MG tablet, Take 500-1,000 mg by mouth every 8 (eight) hours as needed (for pain.). , Disp: , Rfl:    EPINEPHrine (PRIMATENE MIST IN), Inhale 2 puffs into the lungs as needed (wheezing or sob)., Disp: , Rfl:    oxyCODONE (OXY IR/ROXICODONE) 5 MG immediate release tablet, Take 1 tablet (5 mg total) by mouth every 4 (four) hours as needed for severe pain., Disp: 60 tablet, Rfl: 0   SYMBICORT 160-4.5 MCG/ACT inhaler, Inhale 2 puffs into the lungs 2 (two) times daily., Disp: 1 each, Rfl: 2   lidocaine-prilocaine (EMLA) cream, Apply to affected area once (Patient not taking: No sig reported), Disp: 30 g,  Rfl: 3   LORazepam (ATIVAN) 0.5 MG tablet, Take 1 tablet (0.5 mg total) by mouth every 6 (six) hours as needed (Nausea or vomiting). (Patient not taking: No sig reported), Disp: 30 tablet, Rfl: 0   OLANZapine (ZYPREXA) 10 MG tablet, Take 1 tablet (10 mg total) by mouth at bedtime. (Patient not taking: No sig reported), Disp: 30 tablet, Rfl: 2   ondansetron (ZOFRAN) 8 MG tablet, Take 1 tablet (8 mg total) by mouth 2 (two) times daily as needed. Start on the third day after chemotherapy. (Patient not taking: No sig reported), Disp: 30 tablet, Rfl: 1   prochlorperazine (COMPAZINE) 10 MG tablet, Take 1 tablet (10 mg total) by mouth every 6 (six) hours as needed (Nausea or vomiting). (Patient not taking: No sig reported), Disp: 30 tablet, Rfl: 1 No current facility-administered medications for this visit.  Facility-Administered Medications Ordered in Other Visits:    sodium chloride flush (NS) 0.9 % injection 10 mL, 10 mL, Intravenous, PRN, Sindy Guadeloupe, MD, 10 mL at 03/09/20 0919  Physical exam:  Vitals:   03/22/21 0842  BP: 128/70  Pulse: 66  Resp: 16  Temp: (!) 97.4 F (36.3 C)  TempSrc: Tympanic  Weight: 197 lb 11.2 oz (89.7 kg)   Physical Exam Constitutional:      General: He is not in acute distress. Cardiovascular:     Rate and Rhythm: Normal rate and regular rhythm.     Heart sounds: Normal heart sounds.  Pulmonary:     Effort: Pulmonary effort is normal.     Breath sounds: Normal breath sounds.  Abdominal:     General: Bowel sounds are normal.     Palpations: Abdomen is soft.  Skin:    General: Skin is warm and dry.  Neurological:     Mental Status: He is alert and oriented to person, place, and time.     CMP Latest Ref Rng & Units 03/22/2021  Glucose 70 - 99 mg/dL 103(H)  BUN 8 - 23 mg/dL 18  Creatinine 0.61 - 1.24 mg/dL 1.04  Sodium 135 - 145 mmol/L 140  Potassium 3.5 - 5.1 mmol/L 3.8  Chloride 98 - 111 mmol/L 104  CO2 22 - 32 mmol/L 24  Calcium 8.9 - 10.3 mg/dL  9.2  Total Protein 6.5 - 8.1 g/dL 7.2  Total Bilirubin 0.3 - 1.2 mg/dL 0.5  Alkaline Phos 38 - 126 U/L 76  AST 15 - 41 U/L 20  ALT 0 - 44 U/L 12   CBC Latest Ref Rng & Units 03/22/2021  WBC 4.0 - 10.5 K/uL 7.6  Hemoglobin 13.0 - 17.0 g/dL 13.6  Hematocrit 39.0 - 52.0 % 39.8  Platelets 150 - 400 K/uL 236    No images are attached to the encounter.  CT SOFT TISSUE NECK WO CONTRAST  Result Date: 03/02/2021 CLINICAL DATA:  Provided history: Primary spina cell carcinoma palatine tonsil. Counter for anti neoplastic immunotherapy. Squamous cell carcinoma palatine tonsil. EXAM: CT NECK WITHOUT CONTRAST TECHNIQUE: Multidetector CT imaging of the neck was performed following the standard protocol without intravenous contrast. COMPARISON:  Neck CT 10/06/2020. FINDINGS: Pharynx and larynx: No evidence of a recurrent right tonsillar mass on this noncontrast examination. Unchanged appearance of the left palatine tonsil with likely postinflammatory calcifications. No appreciable swelling or discrete mass elsewhere within the oral cavity, pharynx or larynx. Salivary glands: No inflammation, mass, or stone. Thyroid: Unremarkable. Lymph nodes: Unchanged size of an enlarged right level 2 lymph node again measuring 3.1 x 2.0 cm. Persistent asymmetrically prominent right level 2/3 lymph nodes more inferiorly within the right neck (for instance as seen on series 3, images 50 and 57). These lymph nodes mound measure up to 8 mm in short axis (previously 10 mm Vascular: There is limited assessment of the major vascular structures of the neck in the absence of intravenous contrast. Atherosclerotic calcification within the aortic arch and proximal major branch vessels of the neck. Limited intracranial: No evidence of acute intracranial abnormality within the field of view. Visualized orbits: No mass or acute finding at the imaged levels. Mastoids and visualized paranasal sinuses: Mild left frontal sinus mucosal thickening.  Moderate bilateral ethmoid sinus mucosal thickening. Frothy secretions within a posterior right ethmoid air cell. Mild left sphenoid sinus mucosal thickening. Mild mucosal thickening within the right greater than left maxillary sinuses. No significant mastoid effusion. 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 Cervical spondylosis. No acute bony abnormality or aggressive osseous lesion. Upper chest: Separately reported on concurrently performed CT chest/abdomen/pelvis. Partially imaged right chest infusion port catheter. IMPRESSION: Within the  limitations of a noncontrast examination, there is no evidence of local recurrence at the right palatine tonsil. Redemonstrated right level 2/3 cervical lymphadenopathy. The dominant enlarged right level 2 lymph node is unchanged in size as compared to the prior neck CT of 10/06/2020, again measuring 3.1 x 2.0 cm. Smaller level 2/3 lymph nodes more inferiorly within the right neck have slightly decreased in size, now measuring up to 8 mm in short axis (previously 10 mm). Paranasal sinus disease, as described. Correlate for acute sinusitis. Electronically Signed   By: Kellie Simmering DO   On: 03/02/2021 12:51   CT CHEST ABDOMEN PELVIS WO CONTRAST  Result Date: 03/02/2021 CLINICAL DATA:  Right tonsillar squamous cell carcinoma, stage IV. EXAM: CT CHEST, ABDOMEN AND PELVIS WITHOUT CONTRAST TECHNIQUE: Multidetector CT imaging of the chest, abdomen and pelvis was performed following the standard protocol without IV contrast. COMPARISON:  10/06/2020. FINDINGS: CT CHEST FINDINGS Cardiovascular: Right IJ Port-A-Cath terminates in the SVC. Atherosclerotic calcification of the aorta and coronary arteries. Heart is enlarged. No pericardial effusion. Mediastinum/Nodes: No pathologically enlarged mediastinal or axillary lymph nodes. Hilar regions are difficult to evaluate without IV contrast. Esophagus is grossly unremarkable. Lungs/Pleura: 3 mm nodule in the apical  segment right upper lobe (4/39) is new from 10/06/2020. Scattered volume loss in the right middle lobe, lingula and both lower lobes. No additional pulmonary nodules. No pleural fluid. Airway is unremarkable. Musculoskeletal: Degenerative changes in the spine. No worrisome lytic or sclerotic lesions. CT ABDOMEN PELVIS FINDINGS Hepatobiliary: Liver and gallbladder are unremarkable. No biliary ductal dilatation. Pancreas: Negative. Spleen: Negative. Adrenals/Urinary Tract: Adrenal glands are unremarkable. Stones are seen in the kidneys bilaterally. Low-attenuation lesion in the lower pole right kidney measures 4.7 cm and is likely a cyst. Adjacent subcentimeter low-attenuation lesions in the lower pole right kidney are too small to characterize. Kidneys are otherwise unremarkable. Ureters are decompressed. Ventral bladder wall thickening. The ventral aspect of the bladder extends into a left inguinal hernia. Stomach/Bowel: Stomach, small bowel, appendix and colon are unremarkable. Vascular/Lymphatic: Vascular structures are unremarkable. Scattered lymph nodes are not enlarged by CT size criteria. Reproductive: Prostate is enlarged. Other: Left inguinal hernia contains fat and the ventral portion of the bladder. Right inguinal hernia contains fat. Musculoskeletal: Degenerative changes in the spine. No worrisome lytic or sclerotic lesions. IMPRESSION: 1. New 3 mm apical segment right upper lobe nodule. Continued attention on follow-up exams is warranted as metastatic disease cannot be excluded. 2. Bilateral renal stones. 3. Left inguinal hernia contains the ventral portion of the bladder. 4. Enlarged prostate. 5. Aortic atherosclerosis (ICD10-I70.0). Coronary artery calcification. Electronically Signed   By: Lorin Picket M.D.   On: 03/02/2021 12:10     Assessment and plan- Patient is a 70 y.o. 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  He is here for on treatment assessment prior to cycle 20 of palliative Keytruda  I have reviewed CT soft tissue neck CT chest abdomen pelvis images independently and discussed findings with the patient.  On initial diagnosis patient had evidence of bulky right cervical adenopathy, right palatine tonsillar mass, bilateral lung nodules as well as subcarinal adenopathy.  Both the cervical nodal mass was biopsy-proven p16 positive squamous cell carcinoma and the subcarinal lymph node was also squamous cell carcinoma likely thought to be metastatic from head and neck primary.Since then patient has had a good response to treatment and present scan showed resolution of all  pulmonary nodules as well as subcarinal adenopathy.  He still has level 2 lymph node 3.1 x 2 cm which has remained unchanged over the last 6 to 8 months.  Other level 2 level 3 lymph nodes are subcentimeter's and not pathologically enlarged.  New 3 mm right upper lobe lung nodule was seen.  It is unclear of this is nonspecific versus neoplastic.     There is no overt evidence of disease progression at this time and my plan is to continue Keytruda until progression or toxicity.  If there is a consistent enlargement in the size of the right cervical lymph node offering palliative radiation to that area is a possibility.  Patient comprehends my plan well.  Proceed with Keytruda today and I will see him back in 3 weeks for next cycle of Keytruda.  He has been on maintenance Keytruda now for 1-1/2 years  He has chronic nonmalignant back pain for which she sparingly uses oxycodone I will renew the prescription today Visit Diagnosis 1. Encounter for antineoplastic immunotherapy   2. 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 Randa Evens, MD, MPH [facility] [phone] 03/22/2021 8:59 AM

## 2021-04-09 ENCOUNTER — Other Ambulatory Visit: Payer: Self-pay | Admitting: *Deleted

## 2021-04-09 DIAGNOSIS — C099 Malignant neoplasm of tonsil, unspecified: Secondary | ICD-10-CM

## 2021-04-12 ENCOUNTER — Encounter: Payer: Self-pay | Admitting: Oncology

## 2021-04-12 ENCOUNTER — Inpatient Hospital Stay: Payer: Medicare Other

## 2021-04-12 ENCOUNTER — Inpatient Hospital Stay: Payer: Medicare Other | Attending: Oncology | Admitting: Oncology

## 2021-04-12 ENCOUNTER — Other Ambulatory Visit: Payer: Self-pay

## 2021-04-12 VITALS — BP 124/82 | HR 62 | Temp 96.7°F | Resp 20 | Wt 198.8 lb

## 2021-04-12 DIAGNOSIS — Z79899 Other long term (current) drug therapy: Secondary | ICD-10-CM | POA: Diagnosis not present

## 2021-04-12 DIAGNOSIS — C7800 Secondary malignant neoplasm of unspecified lung: Secondary | ICD-10-CM | POA: Insufficient documentation

## 2021-04-12 DIAGNOSIS — C099 Malignant neoplasm of tonsil, unspecified: Secondary | ICD-10-CM

## 2021-04-12 DIAGNOSIS — R590 Localized enlarged lymph nodes: Secondary | ICD-10-CM | POA: Diagnosis not present

## 2021-04-12 DIAGNOSIS — Z5112 Encounter for antineoplastic immunotherapy: Secondary | ICD-10-CM | POA: Insufficient documentation

## 2021-04-12 DIAGNOSIS — C109 Malignant neoplasm of oropharynx, unspecified: Secondary | ICD-10-CM | POA: Diagnosis present

## 2021-04-12 LAB — COMPREHENSIVE METABOLIC PANEL
ALT: 12 U/L (ref 0–44)
AST: 20 U/L (ref 15–41)
Albumin: 4.1 g/dL (ref 3.5–5.0)
Alkaline Phosphatase: 70 U/L (ref 38–126)
Anion gap: 8 (ref 5–15)
BUN: 15 mg/dL (ref 8–23)
CO2: 25 mmol/L (ref 22–32)
Calcium: 8.9 mg/dL (ref 8.9–10.3)
Chloride: 105 mmol/L (ref 98–111)
Creatinine, Ser: 0.89 mg/dL (ref 0.61–1.24)
GFR, Estimated: >60 mL/min (ref >60)
Glucose, Bld: 103 mg/dL — ABNORMAL HIGH (ref 70–99)
Potassium: 3.9 mmol/L (ref 3.5–5.1)
Sodium: 138 mmol/L (ref 135–145)
Total Bilirubin: 0.8 mg/dL (ref 0.3–1.2)
Total Protein: 7.2 g/dL (ref 6.5–8.1)

## 2021-04-12 LAB — CBC WITH DIFFERENTIAL/PLATELET
Abs Immature Granulocytes: 0.03 10*3/uL (ref 0.00–0.07)
Basophils Absolute: 0.1 10*3/uL (ref 0.0–0.1)
Basophils Relative: 1 %
Eosinophils Absolute: 0.5 10*3/uL (ref 0.0–0.5)
Eosinophils Relative: 7 %
HCT: 39.5 % (ref 39.0–52.0)
Hemoglobin: 13.4 g/dL (ref 13.0–17.0)
Immature Granulocytes: 0 %
Lymphocytes Relative: 18 %
Lymphs Abs: 1.4 10*3/uL (ref 0.7–4.0)
MCH: 33 pg (ref 26.0–34.0)
MCHC: 33.9 g/dL (ref 30.0–36.0)
MCV: 97.3 fL (ref 80.0–100.0)
Monocytes Absolute: 0.7 10*3/uL (ref 0.1–1.0)
Monocytes Relative: 9 %
Neutro Abs: 5 10*3/uL (ref 1.7–7.7)
Neutrophils Relative %: 65 %
Platelets: 226 10*3/uL (ref 150–400)
RBC: 4.06 MIL/uL — ABNORMAL LOW (ref 4.22–5.81)
RDW: 12.9 % (ref 11.5–15.5)
WBC: 7.7 10*3/uL (ref 4.0–10.5)
nRBC: 0 % (ref 0.0–0.2)

## 2021-04-12 LAB — TSH: TSH: 4.664 u[IU]/mL — ABNORMAL HIGH (ref 0.350–4.500)

## 2021-04-12 MED ORDER — HEPARIN SOD (PORK) LOCK FLUSH 100 UNIT/ML IV SOLN
INTRAVENOUS | Status: AC
  Administered 2021-04-12: 500 [IU]
  Filled 2021-04-12: qty 5

## 2021-04-12 MED ORDER — SYMBICORT 160-4.5 MCG/ACT IN AERO: 2.0000 | INHALATION_SPRAY | Freq: Two times a day (BID) | RESPIRATORY_TRACT | 2 refills | Status: DC

## 2021-04-12 MED ORDER — SODIUM CHLORIDE 0.9 % IV SOLN
Freq: Once | INTRAVENOUS | Status: AC
  Filled 2021-04-12: qty 250

## 2021-04-12 MED ORDER — PROCHLORPERAZINE EDISYLATE 10 MG/2ML IJ SOLN
10.0000 mg | Freq: Once | INTRAMUSCULAR | Status: AC
  Administered 2021-04-12: 10 mg via INTRAVENOUS
  Filled 2021-04-12: qty 2

## 2021-04-12 MED ORDER — SODIUM CHLORIDE 0.9 % IV SOLN
200.0000 mg | Freq: Once | INTRAVENOUS | Status: AC
  Administered 2021-04-12: 200 mg via INTRAVENOUS
  Filled 2021-04-12: qty 8

## 2021-04-12 NOTE — Patient Instructions (Signed)
[facility]  Discharge Instructions: Thank you for choosing [facility] to provide your oncology and hematology care.  If you have a lab appointment with the [facility], please go directly to the [facility] and check in at the registration area.  Wear comfortable clothing and clothing appropriate for easy access to any Portacath or PICC line.   We strive to give you quality time with your provider. You may need to reschedule your appointment if you arrive late (15 or more minutes).  Arriving late affects you and other patients whose appointments are after yours.  Also, if you miss three or more appointments without notifying the office, you may be dismissed from the clinic at the provider's discretion.      For prescription refill requests, have your pharmacy contact our office and allow 72 hours for refills to be completed.    Today you received the following chemotherapy and/or immunotherapy agents : Keytruda   To help prevent nausea and vomiting after your treatment, we encourage you to take your nausea medication as directed.  BELOW ARE SYMPTOMS THAT SHOULD BE REPORTED IMMEDIATELY: *FEVER GREATER THAN 100.4 F (38 C) OR HIGHER *CHILLS OR SWEATING *NAUSEA AND VOMITING THAT IS NOT CONTROLLED WITH YOUR NAUSEA MEDICATION *UNUSUAL SHORTNESS OF BREATH *UNUSUAL BRUISING OR BLEEDING *URINARY PROBLEMS (pain or burning when urinating, or frequent urination) *BOWEL PROBLEMS (unusual diarrhea, constipation, pain near the anus) TENDERNESS IN MOUTH AND THROAT WITH OR WITHOUT PRESENCE OF ULCERS (sore throat, sores in mouth, or a toothache) UNUSUAL RASH, SWELLING OR PAIN  UNUSUAL VAGINAL DISCHARGE OR ITCHING   Items with * indicate a potential emergency and should be followed up as soon as possible or go to the Emergency Department if any problems should occur.  Please show the CHEMOTHERAPY ALERT CARD or IMMUNOTHERAPY ALERT CARD at check-in to  the Emergency Department and triage nurse.  Should you have questions after your visit or need to cancel or reschedule your appointment, please contact [facility]  [phone] and follow the prompts.  Office hours are 8:00 a.m. to 4:30 p.m. Monday - Friday. Please note that voicemails left after 4:00 p.m. may not be returned until the following business day.  We are closed weekends and major holidays. You have access to a nurse at all times for urgent questions. Please call the main number to the clinic [phone] and follow the prompts.  For any non-urgent questions, you may also contact your provider using MyChart. We now offer e-Visits for anyone 68 and older to request care online for non-urgent symptoms. For details visit [email or web address].   Also download the MyChart app! Go to the app store, search "MyChart", open the app, select , and log in with your MyChart username and password.  Due to Covid, a mask is required upon entering the hospital/clinic. If you do not have a mask, one will be given to you upon arrival. For doctor visits, patients may have 1 support person aged 68 or older with them. For treatment visits, patients cannot have anyone with them due to current Covid guidelines and our immunocompromised population.

## 2021-04-12 NOTE — Progress Notes (Signed)
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 Richardson Landry for symptoms of sore throat and difficulty swallowing which she has been experiencing over the last 1 year.He underwent a comprehensive auto laryngoscopy exam which showed a firm enlarged right tonsillar mass with necrotic area extending onto the adjacent soft palate into the root of the uvula.  Hypopharynx and larynx as well as nasopharynx was unable to be visualized due to gag reflex.  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   PET CT scan showed large hypermetabolic right palatine tonsil mass which crosses the midline.  Ipsilateral cervical nodal metastases.  Bilateral pulmonary nodules including a dominant 1 cm hypermetabolic right lung base nodule.  Multiplicity favor pulmonary metastases.  Subcarinal and equivocal left hilar nodal metastases.  Hypermetabolism involving the ascending colon.  Question mild diverticulitis versus underlying colonic mass or polyp cannot be excluded     Patient underwent EBUS guided biopsy of hilar lymph nodes.  Biopsy showed squamous cell carcinoma.  However HPV/p16 status was uninterpretable.    Patient completed 6 cycles of carbotaxol chemotherapy along with Keytruda and is currently on maintenance Keytruda.  Interval history-patient reports doing well overall  and denies any specific complaints at this time.  Tolerating immunotherapy well without any side effects.  ECOG PS- 0 Pain scale- 0   Review of systems- Review of Systems  Constitutional:  Negative for chills, fever, malaise/fatigue and weight loss.  HENT:  Negative for congestion, ear discharge and nosebleeds.   Eyes:  Negative for blurred vision.  Respiratory:  Negative for cough, hemoptysis, sputum production, shortness of breath and wheezing.   Cardiovascular:  Negative for chest pain, palpitations, orthopnea and claudication.  Gastrointestinal:  Negative for abdominal pain, blood in stool, constipation, diarrhea, heartburn, melena, nausea and vomiting.  Genitourinary:  Negative for dysuria, flank pain, frequency, hematuria and urgency.  Musculoskeletal:  Negative for back pain, joint pain and myalgias.  Skin:  Negative for rash.  Neurological:  Negative for dizziness, tingling, focal weakness, seizures, weakness and headaches.  Endo/Heme/Allergies:  Does not bruise/bleed easily.  Psychiatric/Behavioral:  Negative for depression and suicidal ideas. The patient does not have insomnia.      No Known Allergies   Past Medical History:  Diagnosis Date   Asthma    Lung nodules    Metastasis to lung (HCC)    tonsil cancer and mets to lung   Tonsil cancer ([place])    Tonsil cancer [facility])      Past Surgical History:  Procedure Laterality Date   CERVICAL SPINE SURGERY     LUMBAR SPINE SURGERY     PORTA CATH INSERTION N/A 10/08/2019   Procedure: PORTA CATH INSERTION;  Surgeon: Katha Cabal, MD;  Location: [place] [facility];  Service: Cardiovascular;  Laterality: N/A;   VIDEO BRONCHOSCOPY WITH ENDOBRONCHIAL NAVIGATION N/A 10/10/2019   Procedure: VIDEO BRONCHOSCOPY WITH ENDOBRONCHIAL NAVIGATION;  Surgeon: Patsey Berthold,  Lolita Cram, MD;  Location: [facility] ORS;  Service: Pulmonary;  Laterality: N/A;   VIDEO BRONCHOSCOPY WITH ENDOBRONCHIAL ULTRASOUND N/A 10/10/2019   Procedure: VIDEO  BRONCHOSCOPY WITH ENDOBRONCHIAL ULTRASOUND;  Surgeon: Tyler Pita, MD;  Location: [facility] OR[facility];  Service: Pulmonary;  Laterality: N/A;    Social History   Socioeconomic History   Marital status: Widowed    Spouse name: Not on file   Number of children: Not on file   Years of education: Not on file   Highest education level: Not on file  Occupational History   Not on file  Tobacco Use   Smoking status: Never   Smokeless tobacco: Never  Vaping Use   Vaping Use: Never used  Substance and Sexual Activity   Alcohol use: Never   Drug use: Never   Sexual activity: Not Currently  Other Topics Concern   Not on file  Social History Narrative   Not on file   Social Determinants of Health   Financial Resource Strain: Not on file  Food Insecurity: Not on file  Transportation Needs: Not on file  Physical Activity: Not on file  Stress: Not on file  Social Connections: Not on file  Intimate Partner Violence: Not on file    Family History  Problem Relation Age of Onset   Cancer Mother 19       not sure what type   Prostate cancer Father    Rheum arthritis Father    Healthy Sister    Healthy Brother    Healthy Sister    Healthy Brother    Healthy Brother    Heart disease Brother    Heart disease Brother      Current Outpatient Medications:    acetaminophen (TYLENOL) 500 MG tablet, Take 500-1,000 mg by mouth every 8 (eight) hours as needed (for pain.). , Disp: , Rfl:    EPINEPHrine (PRIMATENE MIST IN), Inhale 2 puffs into the lungs as needed (wheezing or sob)., Disp: , Rfl:    oxyCODONE (OXY IR/ROXICODONE) 5 MG immediate release tablet, Take 1 tablet (5 mg total) by mouth every 4 (four) hours as needed for severe pain., Disp: 60 tablet, Rfl: 0   SYMBICORT 160-4.5 MCG/ACT inhaler, Inhale 2 puffs into the lungs 2 (two) times daily., Disp: 1 each, Rfl: 2   lidocaine-prilocaine (EMLA) cream, Apply to affected area once (Patient not taking: No sig reported), Disp: 30 g, Rfl:  3 No current facility-administered medications for this visit.  Facility-Administered Medications Ordered in Other Visits:    sodium chloride flush (NS) 0.9 % injection 10 mL, 10 mL, Intravenous, PRN, Sindy Guadeloupe, MD, 10 mL at 03/09/20 0919  Physical exam:  Vitals:   04/12/21 0955  BP: 124/82  Pulse: 62  Resp: 20  Temp: (!) 96.7 F (35.9 C)  TempSrc: Tympanic  SpO2: 100%  Weight: 198 lb 12.8 oz (90.2 kg)   Physical Exam Constitutional:      General: He is not in acute distress. Cardiovascular:     Rate and Rhythm: Normal rate and regular rhythm.     Heart sounds: Normal heart sounds.  Pulmonary:     Effort: Pulmonary effort is normal.  Skin:    General: Skin is warm and dry.  Neurological:     Mental Status: He is alert and oriented to person, place, and time.     CMP Latest Ref Rng & Units 04/12/2021  Glucose 70 - 99 mg/dL 103(H)  BUN 8 - 23 mg/dL 15  Creatinine  0.61 - 1.24 mg/dL 0.89  Sodium 135 - 145 mmol/L 138  Potassium 3.5 - 5.1 mmol/L 3.9  Chloride 98 - 111 mmol/L 105  CO2 22 - 32 mmol/L 25  Calcium 8.9 - 10.3 mg/dL 8.9  Total Protein 6.5 - 8.1 g/dL 7.2  Total Bilirubin 0.3 - 1.2 mg/dL 0.8  Alkaline Phos 38 - 126 U/L 70  AST 15 - 41 U/L 20  ALT 0 - 44 U/L 12   CBC Latest Ref Rng & Units 04/12/2021  WBC 4.0 - 10.5 K/uL 7.7  Hemoglobin 13.0 - 17.0 g/dL 13.4  Hematocrit 39.0 - 52.0 % 39.5  Platelets 150 - 400 K/uL 226    Assessment and plan- Patient is a 70 y.o. 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  He is here for on treatment assessment prior to cycle 21 of palliative Keytruda  Counts okay to proceed with cycle 21 of palliative Keytruda today and I will see him back in 3 weeks for cycle 22.  His recent imaging showed persistent right cervical adenopathy about 3 cm in size which was unchanged as compared to prior scans.Previously noted mediastinal adenopathy on  presentation as well as lung metastases no longer seen.  He was noted to have a new 3 mm right upper lobe lung nodule which we will monitor with subsequent scans in November 2022.  Plan is to continue Keytruda until progression or toxicity.  He has been on maintenance Keytruda now for a year   Visit Diagnosis 1. 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 Encounter for antineoplastic immunotherapy      Dr. Randa Evens, MD, MPH [facility] [phone] 04/12/2021 1:02 PM

## 2021-04-15 ENCOUNTER — Other Ambulatory Visit: Payer: Self-pay | Admitting: Oncology

## 2021-05-03 ENCOUNTER — Inpatient Hospital Stay: Payer: Medicare Other | Attending: Oncology

## 2021-05-03 ENCOUNTER — Encounter: Payer: Self-pay | Admitting: Oncology

## 2021-05-03 ENCOUNTER — Inpatient Hospital Stay: Payer: Medicare Other

## 2021-05-03 ENCOUNTER — Other Ambulatory Visit: Payer: Self-pay

## 2021-05-03 ENCOUNTER — Inpatient Hospital Stay (HOSPITAL_BASED_OUTPATIENT_CLINIC_OR_DEPARTMENT_OTHER): Payer: Medicare Other | Admitting: Oncology

## 2021-05-03 VITALS — BP 117/81 | HR 69 | Temp 96.9°F | Resp 20 | Wt 196.8 lb

## 2021-05-03 DIAGNOSIS — C099 Malignant neoplasm of tonsil, unspecified: Secondary | ICD-10-CM

## 2021-05-03 DIAGNOSIS — C109 Malignant neoplasm of oropharynx, unspecified: Secondary | ICD-10-CM | POA: Diagnosis present

## 2021-05-03 DIAGNOSIS — C7800 Secondary malignant neoplasm of unspecified lung: Secondary | ICD-10-CM | POA: Diagnosis present

## 2021-05-03 DIAGNOSIS — Z5112 Encounter for antineoplastic immunotherapy: Secondary | ICD-10-CM | POA: Insufficient documentation

## 2021-05-03 DIAGNOSIS — Z79899 Other long term (current) drug therapy: Secondary | ICD-10-CM | POA: Diagnosis not present

## 2021-05-03 LAB — COMPREHENSIVE METABOLIC PANEL
ALT: 12 U/L (ref 0–44)
AST: 21 U/L (ref 15–41)
Albumin: 4.1 g/dL (ref 3.5–5.0)
Alkaline Phosphatase: 70 U/L (ref 38–126)
Anion gap: 9 (ref 5–15)
BUN: 22 mg/dL (ref 8–23)
CO2: 24 mmol/L (ref 22–32)
Calcium: 8.9 mg/dL (ref 8.9–10.3)
Chloride: 104 mmol/L (ref 98–111)
Creatinine, Ser: 0.89 mg/dL (ref 0.61–1.24)
GFR, Estimated: >60 mL/min (ref >60)
Glucose, Bld: 107 mg/dL — ABNORMAL HIGH (ref 70–99)
Potassium: 4 mmol/L (ref 3.5–5.1)
Sodium: 137 mmol/L (ref 135–145)
Total Bilirubin: 0.5 mg/dL (ref 0.3–1.2)
Total Protein: 7.6 g/dL (ref 6.5–8.1)

## 2021-05-03 LAB — CBC WITH DIFFERENTIAL/PLATELET
Abs Immature Granulocytes: 0.01 10*3/uL (ref 0.00–0.07)
Basophils Absolute: 0 10*3/uL (ref 0.0–0.1)
Basophils Relative: 0 %
Eosinophils Absolute: 0.3 10*3/uL (ref 0.0–0.5)
Eosinophils Relative: 4 %
HCT: 40.9 % (ref 39.0–52.0)
Hemoglobin: 13.8 g/dL (ref 13.0–17.0)
Immature Granulocytes: 0 %
Lymphocytes Relative: 17 %
Lymphs Abs: 1.2 10*3/uL (ref 0.7–4.0)
MCH: 32.8 pg (ref 26.0–34.0)
MCHC: 33.7 g/dL (ref 30.0–36.0)
MCV: 97.1 fL (ref 80.0–100.0)
Monocytes Absolute: 0.7 10*3/uL (ref 0.1–1.0)
Monocytes Relative: 9 %
Neutro Abs: 5 10*3/uL (ref 1.7–7.7)
Neutrophils Relative %: 70 %
Platelets: 241 10*3/uL (ref 150–400)
RBC: 4.21 MIL/uL — ABNORMAL LOW (ref 4.22–5.81)
RDW: 12.9 % (ref 11.5–15.5)
WBC: 7.2 10*3/uL (ref 4.0–10.5)
nRBC: 0 % (ref 0.0–0.2)

## 2021-05-03 MED ORDER — SODIUM CHLORIDE 0.9 % IV SOLN
Freq: Once | INTRAVENOUS | Status: AC
  Filled 2021-05-03: qty 250

## 2021-05-03 MED ORDER — HEPARIN SOD (PORK) LOCK FLUSH 100 UNIT/ML IV SOLN
500.0000 [IU] | Freq: Once | INTRAVENOUS | Status: DC
  Filled 2021-05-03: qty 5

## 2021-05-03 MED ORDER — SODIUM CHLORIDE 0.9 % IV SOLN
200.0000 mg | Freq: Once | INTRAVENOUS | Status: AC
  Administered 2021-05-03: 200 mg via INTRAVENOUS
  Filled 2021-05-03: qty 8

## 2021-05-03 MED ORDER — SODIUM CHLORIDE 0.9% FLUSH
10.0000 mL | Freq: Once | INTRAVENOUS | Status: AC
  Administered 2021-05-03: 10 mL via INTRAVENOUS
  Filled 2021-05-03: qty 10

## 2021-05-03 MED ORDER — SODIUM CHLORIDE 0.9% FLUSH
10.0000 mL | INTRAVENOUS | Status: DC | PRN
  Filled 2021-05-03: qty 10

## 2021-05-03 MED ORDER — PROCHLORPERAZINE EDISYLATE 10 MG/2ML IJ SOLN
10.0000 mg | Freq: Once | INTRAMUSCULAR | Status: AC
  Administered 2021-05-03: 10 mg via INTRAVENOUS
  Filled 2021-05-03: qty 2

## 2021-05-03 MED ORDER — HEPARIN SOD (PORK) LOCK FLUSH 100 UNIT/ML IV SOLN
500.0000 [IU] | Freq: Once | INTRAVENOUS | Status: AC | PRN
  Administered 2021-05-03: 500 [IU]
  Filled 2021-05-03: qty 5

## 2021-05-03 MED ORDER — HEPARIN SOD (PORK) LOCK FLUSH 100 UNIT/ML IV SOLN
INTRAVENOUS | Status: AC
  Filled 2021-05-03: qty 5

## 2021-05-03 NOTE — Progress Notes (Signed)
Pt has no complaints or concerns at this time.

## 2021-05-03 NOTE — Progress Notes (Signed)
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 Richardson Landry for symptoms of sore throat and difficulty swallowing which she has been experiencing over the last 1 year.He underwent a comprehensive auto laryngoscopy exam which showed a firm enlarged right tonsillar mass with necrotic area extending onto the adjacent soft palate into the root of the uvula.  Hypopharynx and larynx as well as nasopharynx was unable to be visualized due to gag reflex.  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   PET CT scan showed large hypermetabolic right palatine tonsil mass which crosses the midline.  Ipsilateral cervical nodal metastases.  Bilateral pulmonary nodules including a dominant 1 cm hypermetabolic right lung base nodule.  Multiplicity favor pulmonary metastases.  Subcarinal and equivocal left hilar nodal metastases.  Hypermetabolism involving the ascending colon.  Question mild diverticulitis versus underlying colonic mass or polyp cannot be excluded     Patient underwent EBUS guided biopsy of hilar lymph nodes.  Biopsy showed squamous cell carcinoma.  However HPV/p16 status was uninterpretable.    Patient completed 6 cycles of carbotaxol chemotherapy along with Keytruda and is currently on maintenance Keytruda.  Interval history-patient reports doing well  overall.  Has occasional pain during swallowing.  Denies any new complaints at this time  ECOG PS- 1 Pain scale- 0   Review of systems- Review of Systems  Constitutional:  Negative for chills, fever, malaise/fatigue and weight loss.  HENT:  Negative for congestion, ear discharge and nosebleeds.   Eyes:  Negative for blurred vision.  Respiratory:  Negative for cough, hemoptysis, sputum production, shortness of breath and wheezing.   Cardiovascular:  Negative for chest pain, palpitations, orthopnea and claudication.  Gastrointestinal:  Negative for abdominal pain, blood in stool, constipation, diarrhea, heartburn, melena, nausea and vomiting.  Genitourinary:  Negative for dysuria, flank pain, frequency, hematuria and urgency.  Musculoskeletal:  Negative for back pain, joint pain and myalgias.  Skin:  Negative for rash.  Neurological:  Negative for dizziness, tingling, focal weakness, seizures, weakness and headaches.  Endo/Heme/Allergies:  Does not bruise/bleed easily.  Psychiatric/Behavioral:  Negative for depression and suicidal ideas. The patient does not have insomnia.      No Known Allergies   Past Medical History:  Diagnosis Date   Asthma    Lung nodules    Metastasis to lung (HCC)    tonsil cancer and mets to lung   Tonsil cancer ([place])    Tonsil cancer [facility])      Past Surgical History:  Procedure Laterality Date   CERVICAL SPINE SURGERY     LUMBAR SPINE SURGERY     PORTA CATH INSERTION N/A 10/08/2019   Procedure: PORTA CATH INSERTION;  Surgeon: Katha Cabal, MD;  Location: [place] [facility];  Service: Cardiovascular;  Laterality: N/A;   VIDEO BRONCHOSCOPY WITH ENDOBRONCHIAL NAVIGATION N/A 10/10/2019   Procedure: VIDEO BRONCHOSCOPY WITH ENDOBRONCHIAL NAVIGATION;  Surgeon: Tyler Pita,  MD;  Location: [facility] ORS;  Service: Pulmonary;  Laterality: N/A;   VIDEO BRONCHOSCOPY WITH ENDOBRONCHIAL ULTRASOUND N/A 10/10/2019   Procedure: VIDEO BRONCHOSCOPY WITH  ENDOBRONCHIAL ULTRASOUND;  Surgeon: Tyler Pita, MD;  Location: [facility] OR[facility];  Service: Pulmonary;  Laterality: N/A;    Social History   Socioeconomic History   Marital status: Widowed    Spouse name: Not on file   Number of children: Not on file   Years of education: Not on file   Highest education level: Not on file  Occupational History   Not on file  Tobacco Use   Smoking status: Never   Smokeless tobacco: Never  Vaping Use   Vaping Use: Never used  Substance and Sexual Activity   Alcohol use: Never   Drug use: Never   Sexual activity: Not Currently  Other Topics Concern   Not on file  Social History Narrative   Not on file   Social Determinants of Health   Financial Resource Strain: Not on file  Food Insecurity: Not on file  Transportation Needs: Not on file  Physical Activity: Not on file  Stress: Not on file  Social Connections: Not on file  Intimate Partner Violence: Not on file    Family History  Problem Relation Age of Onset   Cancer Mother 4       not sure what type   Prostate cancer Father    Rheum arthritis Father    Healthy Sister    Healthy Brother    Healthy Sister    Healthy Brother    Healthy Brother    Heart disease Brother    Heart disease Brother      Current Outpatient Medications:    acetaminophen (TYLENOL) 500 MG tablet, Take 500-1,000 mg by mouth every 8 (eight) hours as needed (for pain.). , Disp: , Rfl:    oxyCODONE (OXY IR/ROXICODONE) 5 MG immediate release tablet, Take 1 tablet (5 mg total) by mouth every 4 (four) hours as needed for severe pain., Disp: 60 tablet, Rfl: 0   SYMBICORT 160-4.5 MCG/ACT inhaler, Inhale 2 puffs by mouth twice daily, Disp: 11 g, Rfl: 0   EPINEPHrine (PRIMATENE MIST IN), Inhale 2 puffs into the lungs as needed (wheezing or sob). (Patient not taking: Reported on 05/03/2021), Disp: , Rfl:    lidocaine-prilocaine (EMLA) cream, Apply to affected area once (Patient not taking: No sig reported), Disp: 30 g,  Rfl: 3 No current facility-administered medications for this visit.  Facility-Administered Medications Ordered in Other Visits:    heparin lock flush 100 UNIT/ML injection, , , ,    heparin lock flush 100 unit/mL, 500 Units, Intravenous, Once, Sindy Guadeloupe, MD   sodium chloride flush (NS) 0.9 % injection 10 mL, 10 mL, Intravenous, PRN, Sindy Guadeloupe, MD, 10 mL at 03/09/20 0919   sodium chloride flush (NS) 0.9 % injection 10 mL, 10 mL, Intracatheter, PRN, Sindy Guadeloupe, MD  Physical exam:  Vitals:   05/03/21 1024  BP: 117/81  Pulse: 69  Resp: 20  Temp: (!) 96.9 F (36.1 C)  TempSrc: Tympanic  SpO2: 99%  Weight: 196 lb 12.8 oz (89.3 kg)   Physical Exam Constitutional:      General: He is not in acute distress. Cardiovascular:     Rate and Rhythm: Normal rate and regular rhythm.     Heart sounds: Normal heart sounds.  Pulmonary:     Effort: Pulmonary effort is normal.     Breath sounds: Normal breath sounds.  Abdominal:     General: Bowel sounds are normal.     Palpations: Abdomen is soft.  Skin:    General: Skin is warm and dry.  Neurological:     Mental Status: He is alert and oriented to person, place, and time.     CMP Latest Ref Rng & Units 05/03/2021  Glucose 70 - 99 mg/dL 107(H)  BUN 8 - 23 mg/dL 22  Creatinine 0.61 - 1.24 mg/dL 0.89  Sodium 135 - 145 mmol/L 137  Potassium 3.5 - 5.1 mmol/L 4.0  Chloride 98 - 111 mmol/L 104  CO2 22 - 32 mmol/L 24  Calcium 8.9 - 10.3 mg/dL 8.9  Total Protein 6.5 - 8.1 g/dL 7.6  Total Bilirubin 0.3 - 1.2 mg/dL 0.5  Alkaline Phos 38 - 126 U/L 70  AST 15 - 41 U/L 21  ALT 0 - 44 U/L 12   CBC Latest Ref Rng & Units 05/03/2021  WBC 4.0 - 10.5 K/uL 7.2  Hemoglobin 13.0 - 17.0 g/dL 13.8  Hematocrit 39.0 - 52.0 % 40.9  Platelets 150 - 400 K/uL 241   Assessment and plan- Patient is a 70 y.o. 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  He is here for  on treatment assessment prior to cycle 22 of palliative Keytruda  Counts okay to proceed with cycle 22 of palliative Keytruda today and I will see him back in 3 weeks for cycle 23.  He would be due for repeat scans next month.  Patient reports occasional pain during swallowing.  He is on as needed oxycodone which we will renew today.   Visit Diagnosis 1. Encounter for antineoplastic immunotherapy   2. 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 Randa Evens, MD, MPH [facility] [address] [facility] at [facility] [phone] 05/03/2021 4:04 PM

## 2021-05-03 NOTE — Patient Instructions (Signed)
[facility]COL[facility]  Discharge Instructions: Thank you for choosing [facility] to provide your oncology and hematology care.  If you have a lab appointment with the [facility], please go directly to the [facility] and check in at the registration area.  Wear comfortable clothing and clothing appropriate for easy access to any Portacath or PICC line.   We strive to give you quality time with your provider. You may need to reschedule your appointment if you arrive late (15 or more minutes).  Arriving late affects you and other patients whose appointments are after yours.  Also, if you miss three or more appointments without notifying the office, you may be dismissed from the clinic at the provider's discretion.      For prescription refill requests, have your pharmacy contact our office and allow 72 hours for refills to be completed.    Today you received the following chemotherapy and/or immunotherapy agents - pembrolizumab      To help prevent nausea and vomiting after your treatment, we encourage you to take your nausea medication as directed.  BELOW ARE SYMPTOMS THAT SHOULD BE REPORTED IMMEDIATELY: *FEVER GREATER THAN 100.4 F (38 C) OR HIGHER *CHILLS OR SWEATING *NAUSEA AND VOMITING THAT IS NOT CONTROLLED WITH YOUR NAUSEA MEDICATION *UNUSUAL SHORTNESS OF BREATH *UNUSUAL BRUISING OR BLEEDING *URINARY PROBLEMS (pain or burning when urinating, or frequent urination) *BOWEL PROBLEMS (unusual diarrhea, constipation, pain near the anus) TENDERNESS IN MOUTH AND THROAT WITH OR WITHOUT PRESENCE OF ULCERS (sore throat, sores in mouth, or a toothache) UNUSUAL RASH, SWELLING OR PAIN  UNUSUAL VAGINAL DISCHARGE OR ITCHING   Items with * indicate a potential emergency and should be followed up as soon as possible or go to the Emergency Department if any problems should occur.  Please show the CHEMOTHERAPY ALERT CARD or IMMUNOTHERAPY ALERT CARD at  check-in to the Emergency Department and triage nurse.  Should you have questions after your visit or need to cancel or reschedule your appointment, please contact [facility]  [phone] and follow the prompts.  Office hours are 8:00 a.m. to 4:30 p.m. Monday - Friday. Please note that voicemails left after 4:00 p.m. may not be returned until the following business day.  We are closed weekends and major holidays. You have access to a nurse at all times for urgent questions. Please call the main number to the clinic [phone] and follow the prompts.  For any non-urgent questions, you may also contact your provider using MyChart. We now offer e-Visits for anyone 67 and older to request care online for non-urgent symptoms. For details visit [email or web address].   Also download the MyChart app! Go to the app store, search "MyChart", open the app, select [facility], and log in with your MyChart username and password.  Due to Covid, a mask is required upon entering the hospital/clinic. If you do not have a mask, one will be given to you upon arrival. For doctor visits, patients may have 1 support person aged 25 or older with them. For treatment visits, patients cannot have anyone with them due to current Covid guidelines and our immunocompromised population.   Pembrolizumab injection What is this medication? PEMBROLIZUMAB (pem broe liz ue mab) is a monoclonal antibody. It is used to treat certain types of cancer. This medicine may be used for other purposes; ask your health care provider or pharmacist if you have questions. COMMON BRAND NAME(S): Keytruda What should I tell my care  team before I take this medication? They need to know if you have any of these conditions: autoimmune diseases like Crohn's disease, ulcerative colitis, or lupus have had or planning to have an allogeneic stem cell transplant (uses someone else's stem cells) history of organ  transplant history of chest radiation nervous system problems like myasthenia gravis or Guillain-Barre syndrome an unusual or allergic reaction to pembrolizumab, other medicines, foods, dyes, or preservatives pregnant or trying to get pregnant breast-feeding How should I use this medication? This medicine is for infusion into a vein. It is given by a health care professional in a hospital or clinic setting. A special MedGuide will be given to you before each treatment. Be sure to read this information carefully each time. Talk to your pediatrician regarding the use of this medicine in children. While this drug may be prescribed for children as young as 6 months for selected conditions, precautions do apply. Overdosage: If you think you have taken too much of this medicine contact a poison control center or emergency room at once. NOTE: This medicine is only for you. Do not share this medicine with others. What if I miss a dose? It is important not to miss your dose. Call your doctor or health care professional if you are unable to keep an appointment. What may interact with this medication? Interactions have not been studied. This list may not describe all possible interactions. Give your health care provider a list of all the medicines, herbs, non-prescription drugs, or dietary supplements you use. Also tell them if you smoke, drink alcohol, or use illegal drugs. Some items may interact with your medicine. What should I watch for while using this medication? Your condition will be monitored carefully while you are receiving this medicine. You may need blood work done while you are taking this medicine. Do not become pregnant while taking this medicine or for 4 months after stopping it. Women should inform their doctor if they wish to become pregnant or think they might be pregnant. There is a potential for serious side effects to an unborn child. Talk to your health care professional or  pharmacist for more information. Do not breast-feed an infant while taking this medicine or for 4 months after the last dose. What side effects may I notice from receiving this medication? Side effects that you should report to your doctor or health care professional as soon as possible: allergic reactions like skin rash, itching or hives, swelling of the face, lips, or tongue bloody or black, tarry breathing problems changes in vision chest pain chills confusion constipation cough diarrhea dizziness or feeling faint or lightheaded fast or irregular heartbeat fever flushing joint pain low blood counts - this medicine may decrease the number of white blood cells, red blood cells and platelets. You may be at increased risk for infections and bleeding. muscle pain muscle weakness pain, tingling, numbness in the hands or feet persistent headache redness, blistering, peeling or loosening of the skin, including inside the mouth signs and symptoms of high blood sugar such as dizziness; dry mouth; dry skin; fruity breath; nausea; stomach pain; increased hunger or thirst; increased urination signs and symptoms of kidney injury like trouble passing urine or change in the amount of urine signs and symptoms of liver injury like dark urine, light-colored stools, loss of appetite, nausea, right upper belly pain, yellowing of the eyes or skin sweating swollen lymph nodes weight loss Side effects that usually do not require medical attention (report to your doctor  or health care professional if they continue or are bothersome): decreased appetite hair loss tiredness This list may not describe all possible side effects. Call your doctor for medical advice about side effects. You may report side effects to FDA at 1-800-FDA-1088. Where should I keep my medication? This drug is given in a hospital or clinic and will not be stored at home. NOTE: This sheet is a summary. It may not cover all possible  information. If you have questions about this medicine, talk to your doctor, pharmacist, or health care provider.  2022 Elsevier/Gold Standard (2019-07-16 21:44:53)

## 2021-05-09 IMAGING — CT CT NECK W/ CM
3 of 11 series · 12 of 36 positions shown, 13 images · IV contrast (omnipaque)
Comparison: PET CT 09/30/2019

CLINICAL DATA: Squamous cell carcinoma palatine tonsil

EXAM:
CT NECK WITH CONTRAST
TECHNIQUE: Multidetector CT imaging of the neck was performed using the
standard protocol following the bolus administration of intravenous
contrast.
CONTRAST:  75mL OMNIPAQUE IOHEXOL 300 MG/ML  SOLN

[Series 15: coronals neck 2.00 cor · coronal · 0.61mm/px · 3 of 121 slices shown]
[im 19/121  bone]
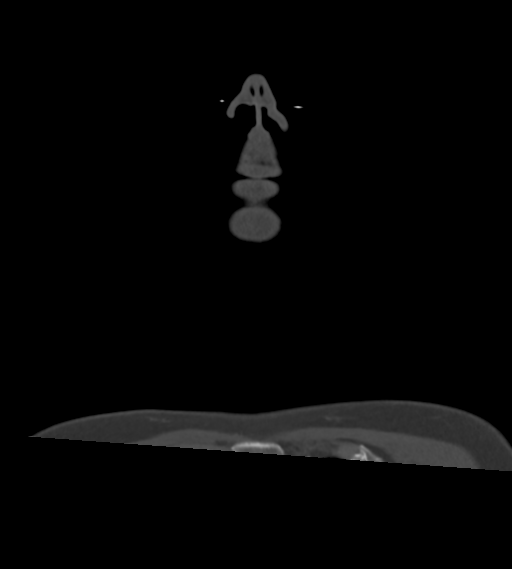
[im 61/121  bone]
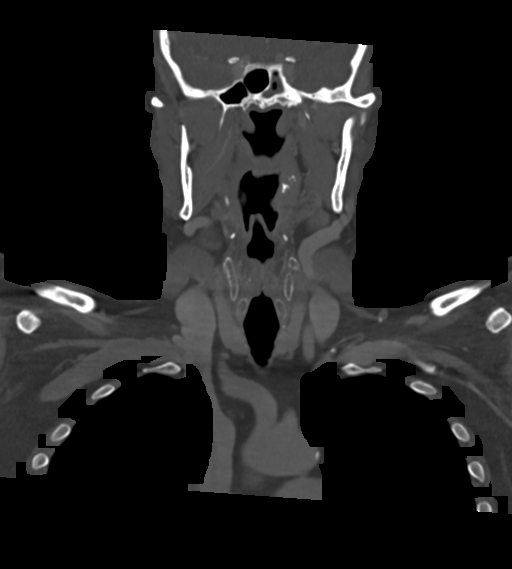
[im 103/121  bone]
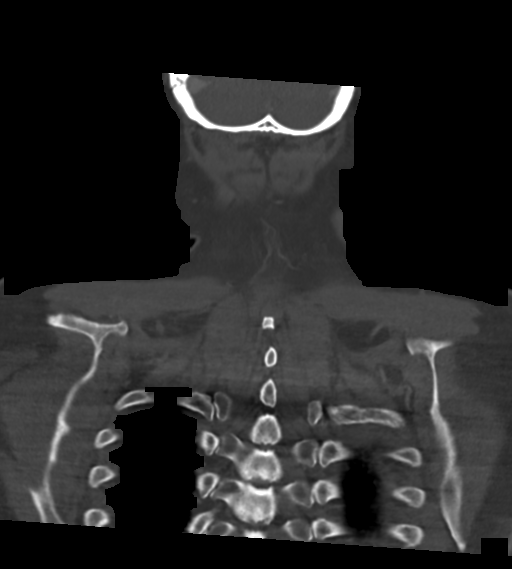

[Series 17: sagittals neck 2.00 sag · sagittal · 0.48mm/px · 6 of 155 slices shown]
[im 26/155  bone]
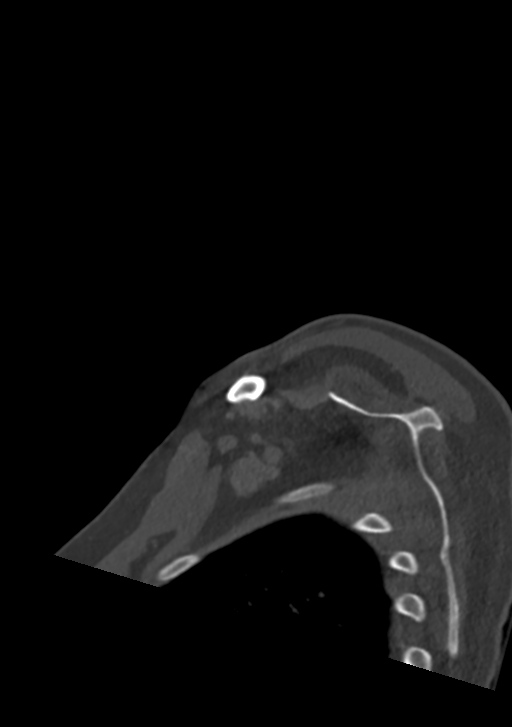
[im 42/155  soft-tissue]
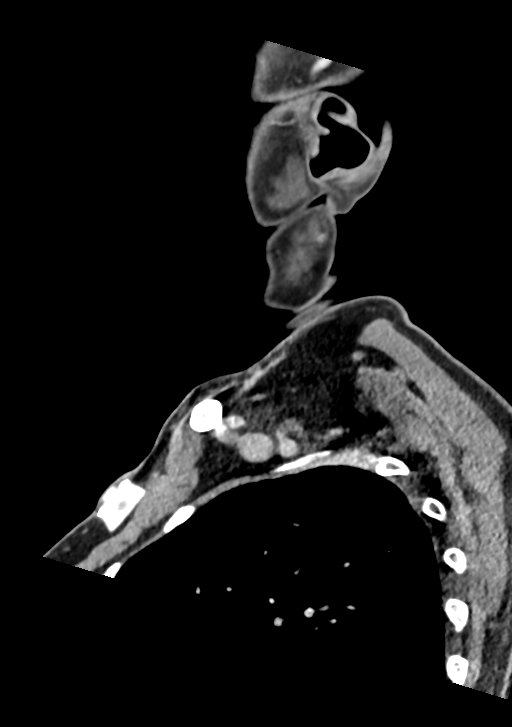
[im 52/155  bone]
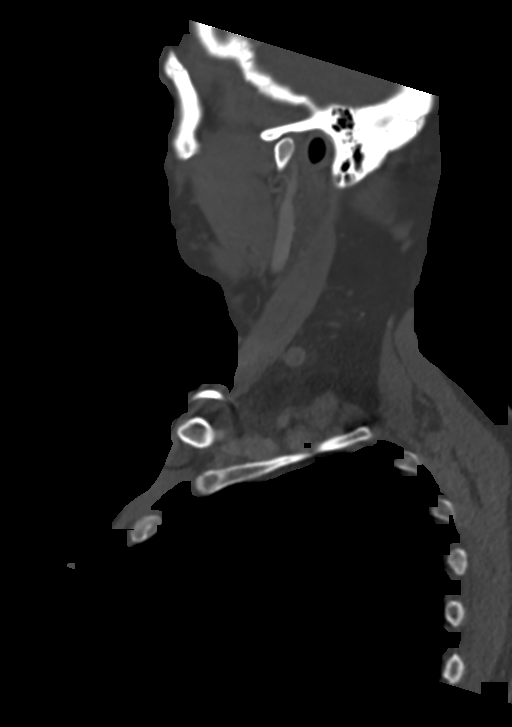
[im 78/155  bone]
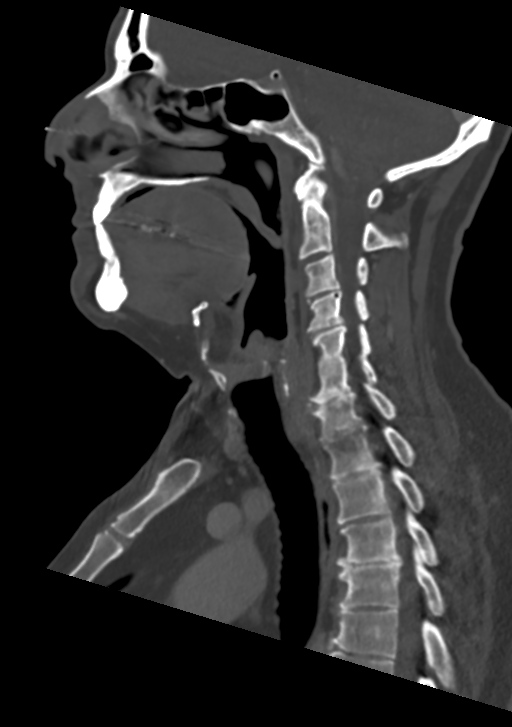
[im 103/155  bone]
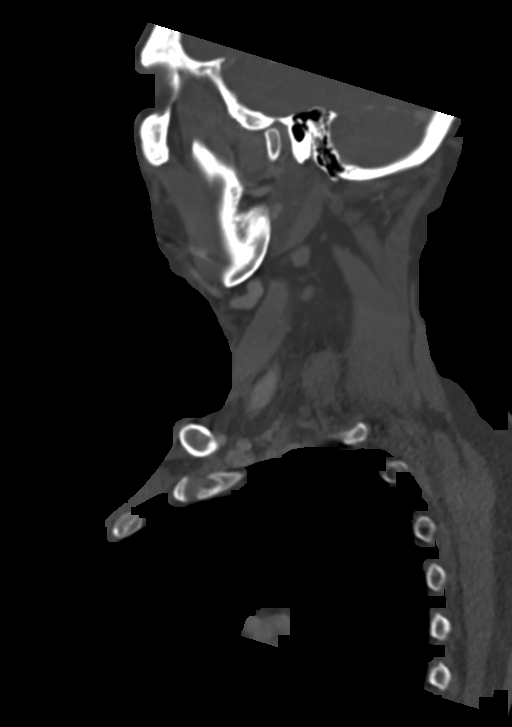
[im 129/155  bone]
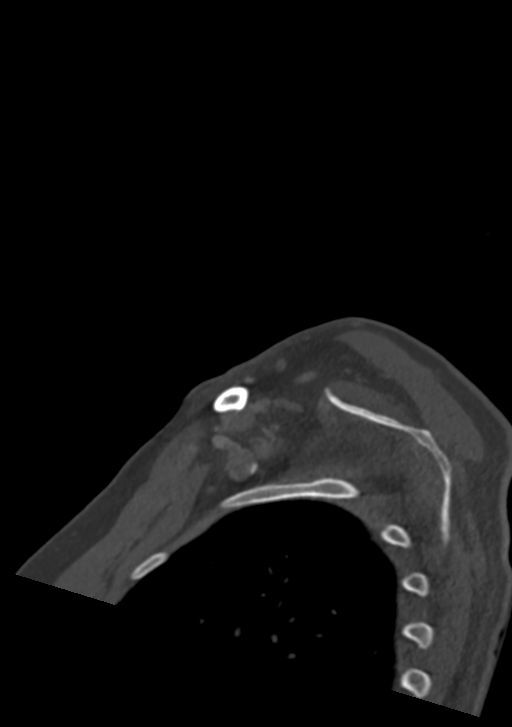

[Series 19: ax oropharynx neck 2.00 ax · axial · 0.48mm/px · z∈[-1129,-803]mm · 3 of 172 slices shown, 4 images]
[im 1/172  soft-tissue]
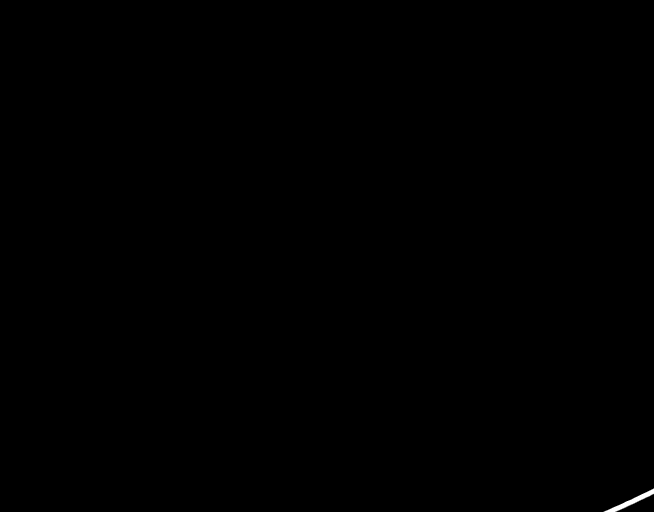
[im 1/172  bone]
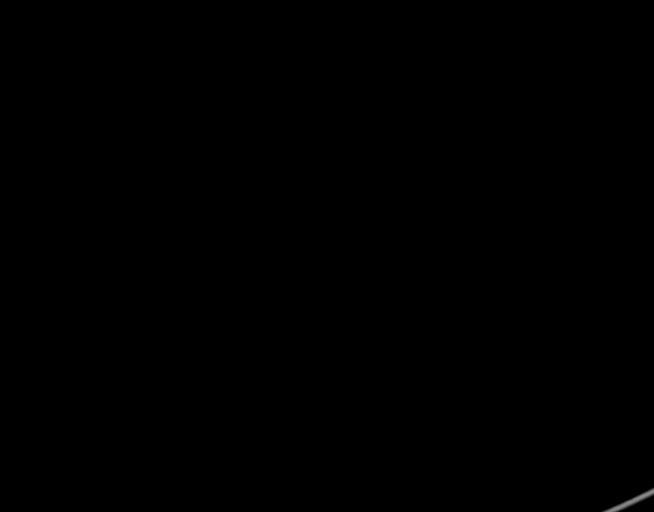
[im 86/172  bone]
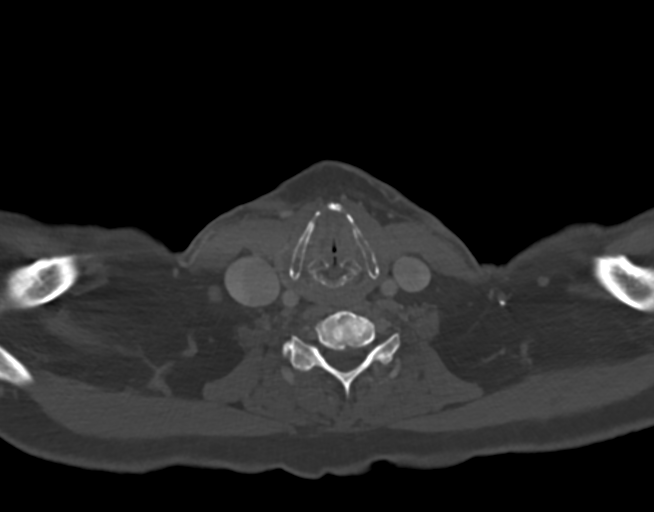
[im 172/172  bone]
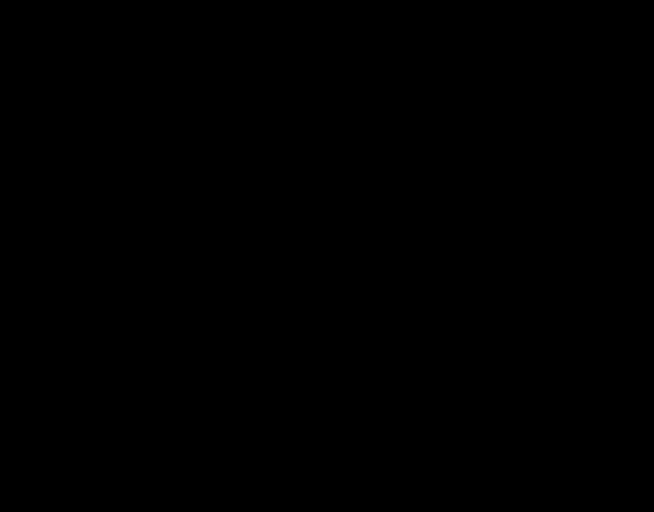

[12 of 36 positions shown; findings below may reference images not displayed]

FINDINGS: Pharynx and larynx: Interval resolution of right palatine tonsil
mass. Mild hypertrophy left tonsil with associated tonsilliths
unchanged from the prior study. Epiglottis and larynx normal.

Salivary glands: No inflammation, mass, or stone.

Thyroid: Normal

Lymph nodes: Interval improvement in cervical lymph nodes on the
right. Right level 2 lymph node now measures 19 mm, previously 28
mm. Posterior lymph nodes on the right have improved. Multiple
posterior lymph nodes on the right measuring up to 9.7 mm have
improved.

No adenopathy in the left neck.

Vascular: Normal vascular enhancement. Port-A-Cath right jugular
vein extending into the SVC.

Limited intracranial: Negative

Visualized orbits: Normal orbit bilaterally.

Mastoids and visualized paranasal sinuses: Mucosal edema right
frontal sinus and bilateral ethmoid sinuses. Mastoid sinus clear
bilaterally. Air-fluid level left sphenoid sinus.

Skeleton: Cervical spondylosis.  No acute skeletal abnormality.

Upper chest: Chest CT reported separately from today

Other: None
IMPRESSION: Interval resolution of previously noted mass in the right tonsil.
Improvement in pathologic lymph nodes in the right neck compared
with the prior PET CT

## 2021-05-24 ENCOUNTER — Inpatient Hospital Stay (HOSPITAL_BASED_OUTPATIENT_CLINIC_OR_DEPARTMENT_OTHER): Payer: Medicare Other

## 2021-05-24 ENCOUNTER — Other Ambulatory Visit: Payer: Self-pay | Admitting: *Deleted

## 2021-05-24 ENCOUNTER — Inpatient Hospital Stay: Payer: Medicare Other

## 2021-05-24 ENCOUNTER — Encounter: Payer: Self-pay | Admitting: Oncology

## 2021-05-24 ENCOUNTER — Inpatient Hospital Stay (HOSPITAL_BASED_OUTPATIENT_CLINIC_OR_DEPARTMENT_OTHER): Payer: Medicare Other | Admitting: Oncology

## 2021-05-24 VITALS — BP 116/81 | HR 71 | Temp 97.4°F | Resp 16 | Ht 70.0 in | Wt 196.6 lb

## 2021-05-24 DIAGNOSIS — C099 Malignant neoplasm of tonsil, unspecified: Secondary | ICD-10-CM

## 2021-05-24 DIAGNOSIS — Z5112 Encounter for antineoplastic immunotherapy: Secondary | ICD-10-CM

## 2021-05-24 LAB — COMPREHENSIVE METABOLIC PANEL
ALT: 12 U/L (ref 0–44)
AST: 18 U/L (ref 15–41)
Albumin: 4 g/dL (ref 3.5–5.0)
Alkaline Phosphatase: 83 U/L (ref 38–126)
Anion gap: 10 (ref 5–15)
BUN: 19 mg/dL (ref 8–23)
CO2: 26 mmol/L (ref 22–32)
Calcium: 9.5 mg/dL (ref 8.9–10.3)
Chloride: 102 mmol/L (ref 98–111)
Creatinine, Ser: 0.9 mg/dL (ref 0.61–1.24)
GFR, Estimated: >60 mL/min (ref >60)
Glucose, Bld: 110 mg/dL — ABNORMAL HIGH (ref 70–99)
Potassium: 3.9 mmol/L (ref 3.5–5.1)
Sodium: 138 mmol/L (ref 135–145)
Total Bilirubin: 0.8 mg/dL (ref 0.3–1.2)
Total Protein: 7.3 g/dL (ref 6.5–8.1)

## 2021-05-24 LAB — CBC WITH DIFFERENTIAL/PLATELET
Abs Immature Granulocytes: 0.03 10*3/uL (ref 0.00–0.07)
Basophils Absolute: 0 10*3/uL (ref 0.0–0.1)
Basophils Relative: 1 %
Eosinophils Absolute: 0.4 10*3/uL (ref 0.0–0.5)
Eosinophils Relative: 6 %
HCT: 39.3 % (ref 39.0–52.0)
Hemoglobin: 13.4 g/dL (ref 13.0–17.0)
Immature Granulocytes: 1 %
Lymphocytes Relative: 19 %
Lymphs Abs: 1.3 10*3/uL (ref 0.7–4.0)
MCH: 33 pg (ref 26.0–34.0)
MCHC: 34.1 g/dL (ref 30.0–36.0)
MCV: 96.8 fL (ref 80.0–100.0)
Monocytes Absolute: 0.6 10*3/uL (ref 0.1–1.0)
Monocytes Relative: 9 %
Neutro Abs: 4.3 10*3/uL (ref 1.7–7.7)
Neutrophils Relative %: 64 %
Platelets: 260 10*3/uL (ref 150–400)
RBC: 4.06 MIL/uL — ABNORMAL LOW (ref 4.22–5.81)
RDW: 12.6 % (ref 11.5–15.5)
WBC: 6.6 10*3/uL (ref 4.0–10.5)
nRBC: 0 % (ref 0.0–0.2)

## 2021-05-24 MED ORDER — PROCHLORPERAZINE EDISYLATE 10 MG/2ML IJ SOLN
10.0000 mg | Freq: Once | INTRAMUSCULAR | Status: AC
  Administered 2021-05-24: 10 mg via INTRAVENOUS
  Filled 2021-05-24: qty 2

## 2021-05-24 MED ORDER — HEPARIN SOD (PORK) LOCK FLUSH 100 UNIT/ML IV SOLN
INTRAVENOUS | Status: AC
  Administered 2021-05-24: 500 [IU]
  Filled 2021-05-24: qty 5

## 2021-05-24 MED ORDER — SODIUM CHLORIDE 0.9 % IV SOLN
Freq: Once | INTRAVENOUS | Status: AC
Start: 2021-05-24 — End: 2021-05-24
  Filled 2021-05-24: qty 250

## 2021-05-24 MED ORDER — HEPARIN SOD (PORK) LOCK FLUSH 100 UNIT/ML IV SOLN
500.0000 [IU] | Freq: Once | INTRAVENOUS | Status: AC | PRN
  Filled 2021-05-24: qty 5

## 2021-05-24 MED ORDER — SYMBICORT 160-4.5 MCG/ACT IN AERO: 2.0000 | INHALATION_SPRAY | Freq: Two times a day (BID) | RESPIRATORY_TRACT | 1 refills | Status: DC

## 2021-05-24 MED ORDER — OXYCODONE HCL 5 MG PO TABS: 5.0000 mg | ORAL_TABLET | ORAL | 0 refills | Status: DC | PRN

## 2021-05-24 MED ORDER — SODIUM CHLORIDE 0.9 % IV SOLN
200.0000 mg | Freq: Once | INTRAVENOUS | Status: AC
  Administered 2021-05-24: 200 mg via INTRAVENOUS
  Filled 2021-05-24: qty 8

## 2021-05-24 NOTE — Progress Notes (Signed)
Pt still has cough off an on with his dx. Feels like something drains in his throat but not all the time

## 2021-05-24 NOTE — Progress Notes (Signed)
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 [facility] Landry for symptoms of sore throat and difficulty swallowing which she has been experiencing over the last 1 year.He underwent a comprehensive auto laryngoscopy exam which showed a firm enlarged right tonsillar mass with necrotic area extending onto the adjacent soft palate into the root of the uvula.  Hypopharynx and larynx as well as nasopharynx was unable to be visualized due to gag reflex.  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   PET CT scan showed large hypermetabolic right palatine tonsil mass which crosses the midline.  Ipsilateral cervical nodal metastases.  Bilateral pulmonary nodules including a dominant 1 cm hypermetabolic right lung base nodule.  Multiplicity favor pulmonary metastases.  Subcarinal and equivocal left hilar nodal metastases.  Hypermetabolism involving the ascending colon.  Question mild diverticulitis versus underlying colonic mass or polyp cannot be excluded     Patient underwent EBUS guided biopsy of hilar lymph nodes.  Biopsy showed squamous cell carcinoma.  However HPV/p16 status was uninterpretable.    Patient completed 6 cycles of carbotaxol chemotherapy along with Keytruda and is currently on maintenance Keytruda.    Interval history-patient is doing well overall  and denies any specific complaints at this time other than occasional pain during swallowing as well as chronic low back pain.  Appetite and weight have remained stable  ECOG PS- 1 Pain scale- 0   Review of systems- Review of Systems  Constitutional:  Negative for chills, fever, malaise/fatigue and weight loss.  HENT:  Negative for congestion, ear discharge and nosebleeds.   Eyes:  Negative for blurred vision.  Respiratory:  Negative for cough, hemoptysis, sputum production, shortness of breath and wheezing.   Cardiovascular:  Negative for chest pain, palpitations, orthopnea and claudication.  Gastrointestinal:  Negative for abdominal pain, blood in stool, constipation, diarrhea, heartburn, melena, nausea and vomiting.  Genitourinary:  Negative for dysuria, flank pain, frequency, hematuria and urgency.  Musculoskeletal:  Negative for back pain, joint pain and myalgias.  Skin:  Negative for rash.  Neurological:  Negative for dizziness, tingling, focal weakness, seizures, weakness and headaches.  Endo/Heme/Allergies:  Does not bruise/bleed easily.  Psychiatric/Behavioral:  Negative for depression and suicidal ideas. The patient does not have insomnia.      No Known Allergies   Past Medical History:  Diagnosis Date   Asthma    Lung nodules    Metastasis to lung (HCC)    tonsil cancer and mets to lung   Tonsil cancer ([facility])    Tonsil cancer [facility])      Past Surgical History:  Procedure Laterality Date   CERVICAL SPINE SURGERY     LUMBAR SPINE SURGERY     PORTA CATH INSERTION N/A 10/08/2019   Procedure: PORTA CATH INSERTION;  Surgeon: Katha Cabal, MD;  Location: [facility] CV LAB;  Service: Cardiovascular;  Laterality: N/A;   VIDEO BRONCHOSCOPY WITH ENDOBRONCHIAL  NAVIGATION N/A 10/10/2019   Procedure: VIDEO BRONCHOSCOPY WITH ENDOBRONCHIAL NAVIGATION;  Surgeon: Tyler Pita, MD;  Location: [facility] ORS;  Service: Pulmonary;  Laterality: N/A;   VIDEO BRONCHOSCOPY WITH  ENDOBRONCHIAL ULTRASOUND N/A 10/10/2019   Procedure: VIDEO BRONCHOSCOPY WITH ENDOBRONCHIAL ULTRASOUND;  Surgeon: Tyler Pita, MD;  Location: [facility] ORS;  Service: Pulmonary;  Laterality: N/A;    Social History   Socioeconomic History   Marital status: Widowed    Spouse name: Not on file   Number of children: Not on file   Years of education: Not on file   Highest education level: Not on file  Occupational History   Not on file  Tobacco Use   Smoking status: Never   Smokeless tobacco: Never  Vaping Use   Vaping Use: Never used  Substance and Sexual Activity   Alcohol use: Never   Drug use: Never   Sexual activity: Not Currently  Other Topics Concern   Not on file  Social History Narrative   Not on file   Social Determinants of Health   Financial Resource Strain: Not on file  Food Insecurity: Not on file  Transportation Needs: Not on file  Physical Activity: Not on file  Stress: Not on file  Social Connections: Not on file  Intimate Partner Violence: Not on file    Family History  Problem Relation Age of Onset   Cancer Mother 72       not sure what type   Prostate cancer Father    Rheum arthritis Father    Healthy Sister    Healthy Brother    Healthy Sister    Healthy Brother    Healthy Brother    Heart disease Brother    Heart disease Brother      Current Outpatient Medications:    acetaminophen (TYLENOL) 500 MG tablet, Take 500-1,000 mg by mouth every 8 (eight) hours as needed (for pain.). , Disp: , Rfl:    EPINEPHrine (PRIMATENE MIST IN), Inhale 2 puffs into the lungs as needed (wheezing or sob). (Patient not taking: Reported on 05/03/2021), Disp: , Rfl:    lidocaine-prilocaine (EMLA) cream, Apply to affected area once (Patient not taking: No sig reported), Disp: 30 g, Rfl: 3   oxyCODONE (OXY IR/ROXICODONE) 5 MG immediate release tablet, Take 1 tablet (5 mg total) by mouth every 4 (four) hours as needed for severe pain., Disp: 60 tablet, Rfl: 0    SYMBICORT 160-4.5 MCG/ACT inhaler, Inhale 2 puffs by mouth twice daily, Disp: 11 g, Rfl: 0 No current facility-administered medications for this visit.  Facility-Administered Medications Ordered in Other Visits:    sodium chloride flush (NS) 0.9 % injection 10 mL, 10 mL, Intravenous, PRN, Sindy Guadeloupe, MD, 10 mL at 03/09/20 0919  Physical exam:  Vitals:   05/24/21 0837  BP: 116/81  Pulse: 71  Resp: 16  Temp: (!) 97.4 F (36.3 C)  TempSrc: Tympanic  Weight: 196 lb 9.6 oz (89.2 kg)  Height: 5\' 10"  (1.778 m)   Physical Exam Cardiovascular:     Rate and Rhythm: Normal rate and regular rhythm.     Heart sounds: Normal heart sounds.  Pulmonary:     Effort: Pulmonary effort is normal.     Breath sounds: Normal breath sounds.  Abdominal:     General: Bowel sounds are normal.     Palpations: Abdomen is soft.  Skin:    General: Skin is warm and dry.  Neurological:     Mental Status: He is  alert and oriented to person, place, and time.     CMP Latest Ref Rng & Units 05/03/2021  Glucose 70 - 99 mg/dL 107(H)  BUN 8 - 23 mg/dL 22  Creatinine 0.61 - 1.24 mg/dL 0.89  Sodium 135 - 145 mmol/L 137  Potassium 3.5 - 5.1 mmol/L 4.0  Chloride 98 - 111 mmol/L 104  CO2 22 - 32 mmol/L 24  Calcium 8.9 - 10.3 mg/dL 8.9  Total Protein 6.5 - 8.1 g/dL 7.6  Total Bilirubin 0.3 - 1.2 mg/dL 0.5  Alkaline Phos 38 - 126 U/L 70  AST 15 - 41 U/L 21  ALT 0 - 44 U/L 12   CBC Latest Ref Rng & Units 05/03/2021  WBC 4.0 - 10.5 K/uL 7.2  Hemoglobin 13.0 - 17.0 g/dL 13.8  Hematocrit 39.0 - 52.0 % 40.9  Platelets 150 - 400 K/uL 241     Assessment and plan- Patient is a 70 y.o. 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  He is here for on treatment assessment prior to cycle 23 of palliative Keytruda  Counts okay to proceed with cycle 23 of palliative Keytruda today.  I will see him back in 3 weeks for cycle 24.  TSH mildly  elevated at 4.6 a month ago which we will repeat in 3 weeks time.  We will do repeat CT scans after cycle 24.  Plan is to continue [place] until progression or toxicity   Visit Diagnosis 1. Encounter for antineoplastic immunotherapy   2. 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 Randa Evens, MD, MPH [facility] at [facility] [phone] 06/01/2021 8:29 AM

## 2021-05-24 NOTE — Patient Instructions (Signed)
[facility] ONCOLOG[facility]  Discharge Instructions: Thank you for choosing [facility] to provide your oncology and hematology care.  If you have a lab appointment with the [facility], please go directly to the [facility] and check in at the registration area.  Wear comfortable clothing and clothing appropriate for easy access to any Portacath or PICC line.   We strive to give you quality time with your provider. You may need to reschedule your appointment if you arrive late (15 or more minutes).  Arriving late affects you and other patients whose appointments are after yours.  Also, if you miss three or more appointments without notifying the office, you may be dismissed from the clinic at the provider's discretion.      For prescription refill requests, have your pharmacy contact our office and allow 72 hours for refills to be completed.    Today you received the following chemotherapy and/or immunotherapy agents Keytruda      To help prevent nausea and vomiting after your treatment, we encourage you to take your nausea medication as directed.  BELOW ARE SYMPTOMS THAT SHOULD BE REPORTED IMMEDIATELY: *FEVER GREATER THAN 100.4 F (38 C) OR HIGHER *CHILLS OR SWEATING *NAUSEA AND VOMITING THAT IS NOT CONTROLLED WITH YOUR NAUSEA MEDICATION *UNUSUAL SHORTNESS OF BREATH *UNUSUAL BRUISING OR BLEEDING *URINARY PROBLEMS (pain or burning when urinating, or frequent urination) *BOWEL PROBLEMS (unusual diarrhea, constipation, pain near the anus) TENDERNESS IN MOUTH AND THROAT WITH OR WITHOUT PRESENCE OF ULCERS (sore throat, sores in mouth, or a toothache) UNUSUAL RASH, SWELLING OR PAIN  UNUSUAL VAGINAL DISCHARGE OR ITCHING   Items with * indicate a potential emergency and should be followed up as soon as possible or go to the Emergency Department if any problems should occur.  Please show the CHEMOTHERAPY ALERT CARD or IMMUNOTHERAPY ALERT CARD at check-in to  the Emergency Department and triage nurse.  Should you have questions after your visit or need to cancel or reschedule your appointment, please contact [facility]  [phone] and follow the prompts.  Office hours are 8:00 a.m. to 4:30 p.m. Monday - Friday. Please note that voicemails left after 4:00 p.m. may not be returned until the following business day.  We are closed weekends and major holidays. You have access to a nurse at all times for urgent questions. Please call the main number to the clinic [phone] and follow the prompts.  For any non-urgent questions, you may also contact your provider using MyChart. We now offer e-Visits for anyone 61 and older to request care online for non-urgent symptoms. For details visit [email or web address].   Also download the MyChart app! Go to the app store, search "MyChart", open the app, select [facility], and log in with your MyChart username and password.  Due to Covid, a mask is required upon entering the hospital/clinic. If you do not have a mask, one will be given to you upon arrival. For doctor visits, patients may have 1 support person aged 46 or older with them. For treatment visits, patients cannot have anyone with them due to current Covid guidelines and our immunocompromised population.

## 2021-06-01 ENCOUNTER — Encounter: Payer: Self-pay | Admitting: Oncology

## 2021-06-14 ENCOUNTER — Inpatient Hospital Stay: Payer: Medicare Other

## 2021-06-14 ENCOUNTER — Inpatient Hospital Stay: Payer: Medicare Other | Admitting: Oncology

## 2021-06-14 ENCOUNTER — Inpatient Hospital Stay: Payer: Medicare Other | Attending: Oncology

## 2021-06-14 ENCOUNTER — Encounter: Payer: Self-pay | Admitting: Oncology

## 2021-06-14 ENCOUNTER — Other Ambulatory Visit: Payer: Self-pay

## 2021-06-14 VITALS — BP 130/75 | HR 62 | Temp 96.9°F | Resp 18 | Wt 196.4 lb

## 2021-06-14 DIAGNOSIS — Z9221 Personal history of antineoplastic chemotherapy: Secondary | ICD-10-CM | POA: Diagnosis not present

## 2021-06-14 DIAGNOSIS — Z79899 Other long term (current) drug therapy: Secondary | ICD-10-CM | POA: Insufficient documentation

## 2021-06-14 DIAGNOSIS — C109 Malignant neoplasm of oropharynx, unspecified: Secondary | ICD-10-CM | POA: Diagnosis present

## 2021-06-14 DIAGNOSIS — C099 Malignant neoplasm of tonsil, unspecified: Secondary | ICD-10-CM | POA: Insufficient documentation

## 2021-06-14 DIAGNOSIS — Z5112 Encounter for antineoplastic immunotherapy: Secondary | ICD-10-CM | POA: Diagnosis present

## 2021-06-14 DIAGNOSIS — C771 Secondary and unspecified malignant neoplasm of intrathoracic lymph nodes: Secondary | ICD-10-CM | POA: Insufficient documentation

## 2021-06-14 DIAGNOSIS — Z95828 Presence of other vascular implants and grafts: Secondary | ICD-10-CM

## 2021-06-14 DIAGNOSIS — C7800 Secondary malignant neoplasm of unspecified lung: Secondary | ICD-10-CM | POA: Diagnosis not present

## 2021-06-14 LAB — CBC WITH DIFFERENTIAL/PLATELET
Abs Immature Granulocytes: 0.02 10*3/uL (ref 0.00–0.07)
Basophils Absolute: 0 10*3/uL (ref 0.0–0.1)
Basophils Relative: 1 %
Eosinophils Absolute: 0.3 10*3/uL (ref 0.0–0.5)
Eosinophils Relative: 5 %
HCT: 38.4 % — ABNORMAL LOW (ref 39.0–52.0)
Hemoglobin: 13.1 g/dL (ref 13.0–17.0)
Immature Granulocytes: 0 %
Lymphocytes Relative: 21 %
Lymphs Abs: 1.2 10*3/uL (ref 0.7–4.0)
MCH: 33.2 pg (ref 26.0–34.0)
MCHC: 34.1 g/dL (ref 30.0–36.0)
MCV: 97.5 fL (ref 80.0–100.0)
Monocytes Absolute: 0.6 10*3/uL (ref 0.1–1.0)
Monocytes Relative: 10 %
Neutro Abs: 3.7 10*3/uL (ref 1.7–7.7)
Neutrophils Relative %: 63 %
Platelets: 241 10*3/uL (ref 150–400)
RBC: 3.94 MIL/uL — ABNORMAL LOW (ref 4.22–5.81)
RDW: 12.7 % (ref 11.5–15.5)
WBC: 5.9 10*3/uL (ref 4.0–10.5)
nRBC: 0 % (ref 0.0–0.2)

## 2021-06-14 LAB — COMPREHENSIVE METABOLIC PANEL
ALT: 12 U/L (ref 0–44)
AST: 21 U/L (ref 15–41)
Albumin: 4 g/dL (ref 3.5–5.0)
Alkaline Phosphatase: 75 U/L (ref 38–126)
Anion gap: 6 (ref 5–15)
BUN: 21 mg/dL (ref 8–23)
CO2: 25 mmol/L (ref 22–32)
Calcium: 8.8 mg/dL — ABNORMAL LOW (ref 8.9–10.3)
Chloride: 107 mmol/L (ref 98–111)
Creatinine, Ser: 0.91 mg/dL (ref 0.61–1.24)
GFR, Estimated: >60 mL/min (ref >60)
Glucose, Bld: 105 mg/dL — ABNORMAL HIGH (ref 70–99)
Potassium: 3.6 mmol/L (ref 3.5–5.1)
Sodium: 138 mmol/L (ref 135–145)
Total Bilirubin: 0.6 mg/dL (ref 0.3–1.2)
Total Protein: 7.2 g/dL (ref 6.5–8.1)

## 2021-06-14 LAB — TSH: TSH: 4.078 u[IU]/mL (ref 0.350–4.500)

## 2021-06-14 MED ORDER — PROCHLORPERAZINE EDISYLATE 10 MG/2ML IJ SOLN
10.0000 mg | Freq: Once | INTRAMUSCULAR | Status: AC
  Administered 2021-06-14: 10 mg via INTRAVENOUS
  Filled 2021-06-14: qty 2

## 2021-06-14 MED ORDER — HEPARIN SOD (PORK) LOCK FLUSH 100 UNIT/ML IV SOLN
500.0000 [IU] | Freq: Once | INTRAVENOUS | Status: AC | PRN
Start: 2021-06-14 — End: 2021-06-14
  Administered 2021-06-14: 500 [IU]
  Filled 2021-06-14: qty 5

## 2021-06-14 MED ORDER — SODIUM CHLORIDE 0.9% FLUSH
10.0000 mL | Freq: Once | INTRAVENOUS | Status: AC
  Administered 2021-06-14: 10 mL via INTRAVENOUS
  Filled 2021-06-14: qty 10

## 2021-06-14 MED ORDER — SODIUM CHLORIDE 0.9 % IV SOLN
200.0000 mg | Freq: Once | INTRAVENOUS | Status: AC
  Administered 2021-06-14: 200 mg via INTRAVENOUS
  Filled 2021-06-14: qty 8

## 2021-06-14 MED ORDER — SODIUM CHLORIDE 0.9 % IV SOLN
Freq: Once | INTRAVENOUS | Status: AC
  Filled 2021-06-14: qty 250

## 2021-06-14 NOTE — Progress Notes (Signed)
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 Richardson Landry for symptoms of sore throat and difficulty swallowing which she has been experiencing over the last 1 year.He underwent a comprehensive auto laryngoscopy exam which showed a firm enlarged right tonsillar mass with necrotic area extending onto the adjacent soft palate into the root of the uvula.  Hypopharynx and larynx as well as nasopharynx was unable to be visualized due to gag reflex.  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   PET CT scan showed large hypermetabolic right palatine tonsil mass which crosses the midline.  Ipsilateral cervical nodal metastases.  Bilateral pulmonary nodules including a dominant 1 cm hypermetabolic right lung base nodule.  Multiplicity favor pulmonary metastases.  Subcarinal and equivocal left hilar nodal metastases.  Hypermetabolism involving the ascending colon.  Question mild diverticulitis versus underlying colonic mass or polyp cannot be excluded     Patient underwent EBUS guided biopsy of hilar lymph nodes.  Biopsy showed squamous cell carcinoma.  However HPV/p16 status was uninterpretable.    Patient completed 6 cycles of carbotaxol chemotherapy along with Keytruda and is currently on maintenance Keytruda.    Interval history-overall tolerating Keytruda well and  denies any new complaints at this time  ECOG PS- 0 Pain scale- 0 Opioid associated constipation- no  Review of systems- Review of Systems  Constitutional:  Negative for chills, fever, malaise/fatigue and weight loss.  HENT:  Negative for congestion, ear discharge and nosebleeds.   Eyes:  Negative for blurred vision.  Respiratory:  Negative for cough, hemoptysis, sputum production, shortness of breath and wheezing.   Cardiovascular:  Negative for chest pain, palpitations, orthopnea and claudication.  Gastrointestinal:  Negative for abdominal pain, blood in stool, constipation, diarrhea, heartburn, melena, nausea and vomiting.  Genitourinary:  Negative for dysuria, flank pain, frequency, hematuria and urgency.  Musculoskeletal:  Negative for back pain, joint pain and myalgias.  Skin:  Negative for rash.  Neurological:  Negative for dizziness, tingling, focal weakness, seizures, weakness and headaches.  Endo/Heme/Allergies:  Does not bruise/bleed easily.  Psychiatric/Behavioral:  Negative for depression and suicidal ideas. The patient does not have insomnia.      No Known Allergies   Past Medical History:  Diagnosis Date   Asthma    Lung nodules    Metastasis to lung (HCC)    tonsil cancer and mets to lung   Tonsil cancer ([place])    Tonsil cancer [facility])      Past Surgical History:  Procedure Laterality Date   CERVICAL SPINE SURGERY     LUMBAR SPINE SURGERY     PORTA CATH INSERTION N/A 10/08/2019   Procedure: PORTA CATH INSERTION;  Surgeon: Katha Cabal, MD;  Location: [place] [facility];  Service: Cardiovascular;  Laterality: N/A;   VIDEO BRONCHOSCOPY WITH ENDOBRONCHIAL NAVIGATION N/A 10/10/2019   Procedure: VIDEO BRONCHOSCOPY WITH ENDOBRONCHIAL NAVIGATION;  Surgeon: Tyler Pita, MD;  Location: [facility];  Service: Pulmonary;  Laterality: N/A;   VIDEO BRONCHOSCOPY WITH ENDOBRONCHIAL ULTRASOUND N/A 10/10/2019   Procedure: VIDEO BRONCHOSCOPY WITH ENDOBRONCHIAL  ULTRASOUND;  Surgeon: Tyler Pita, MD;  Location: [facility];  Service: Pulmonary;  Laterality: N/A;    Social History   Socioeconomic History   Marital status: Widowed    Spouse name: Not on file   Number of children: Not on file   Years of education: Not on file   Highest education level: Not on file  Occupational History   Not on file  Tobacco Use   Smoking status: Never   Smokeless tobacco: Never  Vaping Use   Vaping Use: Never used  Substance and Sexual Activity   Alcohol use: Never   Drug use: Never   Sexual activity: Not Currently  Other Topics Concern   Not on file  Social History Narrative   Not on file   Social Determinants of Health   Financial Resource Strain: Not on file  Food Insecurity: Not on file  Transportation Needs: Not on file  Physical Activity: Not on file  Stress: Not on file  Social Connections: Not on file  Intimate Partner Violence: Not on file    Family History  Problem Relation Age of Onset   Cancer Mother 10       not sure what type   Prostate cancer Father    Rheum arthritis Father    Healthy Sister    Healthy Brother    Healthy Sister    Healthy Brother    Healthy Brother    Heart disease Brother    Heart disease Brother      Current Outpatient Medications:    acetaminophen (TYLENOL) 500 MG tablet, Take 500-1,000 mg by mouth every 8 (eight) hours as needed (for pain.). , Disp: , Rfl:    EPINEPHrine (PRIMATENE MIST IN), Inhale 2 puffs into the lungs as needed (wheezing or sob)., Disp: , Rfl:    oxyCODONE (OXY IR/ROXICODONE) 5 MG immediate release tablet, Take 1 tablet (5 mg total) by mouth every 4 (four) hours as needed for severe pain., Disp: 60 tablet, Rfl: 0   SYMBICORT 160-4.5 MCG/ACT inhaler, Inhale 2 puffs into the lungs 2 (two) times daily., Disp: 11 g, Rfl: 1 No current facility-administered medications for this visit.  Facility-Administered Medications Ordered in Other Visits:    sodium chloride flush (NS) 0.9  % injection 10 mL, 10 mL, Intravenous, PRN, Sindy Guadeloupe, MD, 10 mL at 03/09/20 0919  Physical exam:  Vitals:   06/14/21 0849  BP: 130/75  Pulse: 62  Resp: 18  Temp: (!) 96.9 F (36.1 C)  SpO2: 100%  Weight: 196 lb 6.4 oz (89.1 kg)   Physical Exam HENT:     Head: Normocephalic and atraumatic.  Eyes:     Pupils: Pupils are equal, round, and reactive to light.  Cardiovascular:     Rate and Rhythm: Normal rate and regular rhythm.     Heart sounds: Normal heart sounds.  Pulmonary:     Effort: Pulmonary effort is normal.     Breath sounds: Normal breath sounds.  Abdominal:     General: Bowel sounds are normal.     Palpations: Abdomen is soft.  Musculoskeletal:     Cervical back: Normal range of motion.  Skin:    General: Skin is warm and dry.  Neurological:     Mental Status: He is alert and oriented to person, place, and time.  CMP Latest Ref Rng & Units 06/14/2021  Glucose 70 - 99 mg/dL 105(H)  BUN 8 - 23 mg/dL 21  Creatinine 0.61 - 1.24 mg/dL 0.91  Sodium 135 - 145 mmol/L 138  Potassium 3.5 - 5.1 mmol/L 3.6  Chloride 98 - 111 mmol/L 107  CO2 22 - 32 mmol/L 25  Calcium 8.9 - 10.3 mg/dL 8.8(L)  Total Protein 6.5 - 8.1 g/dL 7.2  Total Bilirubin 0.3 - 1.2 mg/dL 0.6  Alkaline Phos 38 - 126 U/L 75  AST 15 - 41 U/L 21  ALT 0 - 44 U/L 12   CBC Latest Ref Rng & Units 06/14/2021  WBC 4.0 - 10.5 K/uL 5.9  Hemoglobin 13.0 - 17.0 g/dL 13.1  Hematocrit 39.0 - 52.0 % 38.4(L)  Platelets 150 - 400 K/uL 241     Assessment and plan- Patient is a 70 y.o. 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  He is here for on treatment assessment prior to cycle 24 of palliative Keytruda  Counts okay to proceed with cycle 24 of palliative Keytruda today.  I will see him back in 3 weeks for cycle 25.  Plan to repeatCT scans after cycle 25.  Overall he is tolerating treatment well without any significant  side effects.   Visit Diagnosis 1. Encounter for antineoplastic immunotherapy   2. 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 Randa Evens, MD, MPH [facility] [phone] 06/14/2021 8:34 AM

## 2021-06-14 NOTE — Patient Instructions (Signed)
[facility]  Discharge Instructions: Thank you for choosing [facility] to provide your oncology and hematology care.  If you have a lab appointment with the [facility], please go directly to the [facility] and check in at the registration area.  Wear comfortable clothing and clothing appropriate for easy access to any Portacath or PICC line.   We strive to give you quality time with your provider. You may need to reschedule your appointment if you arrive late (15 or more minutes).  Arriving late affects you and other patients whose appointments are after yours.  Also, if you miss three or more appointments without notifying the office, you may be dismissed from the clinic at the provider's discretion.      For prescription refill requests, have your pharmacy contact our office and allow 72 hours for refills to be completed.    Today you received the following chemotherapy and/or immunotherapy agents: Keytruda      To help prevent nausea and vomiting after your treatment, we encourage you to take your nausea medication as directed.  BELOW ARE SYMPTOMS THAT SHOULD BE REPORTED IMMEDIATELY: *FEVER GREATER THAN 100.4 F (38 C) OR HIGHER *CHILLS OR SWEATING *NAUSEA AND VOMITING THAT IS NOT CONTROLLED WITH YOUR NAUSEA MEDICATION *UNUSUAL SHORTNESS OF BREATH *UNUSUAL BRUISING OR BLEEDING *URINARY PROBLEMS (pain or burning when urinating, or frequent urination) *BOWEL PROBLEMS (unusual diarrhea, constipation, pain near the anus) TENDERNESS IN MOUTH AND THROAT WITH OR WITHOUT PRESENCE OF ULCERS (sore throat, sores in mouth, or a toothache) UNUSUAL RASH, SWELLING OR PAIN  UNUSUAL VAGINAL DISCHARGE OR ITCHING   Items with * indicate a potential emergency and should be followed up as soon as possible or go to the Emergency Department if any problems should occur.  Please show the CHEMOTHERAPY ALERT CARD or IMMUNOTHERAPY ALERT CARD at check-in  to the Emergency Department and triage nurse.  Should you have questions after your visit or need to cancel or reschedule your appointment, please contact [place]  [phone] and follow the prompts.  Office hours are 8:00 a.m. to 4:30 p.m. Monday - Friday. Please note that voicemails left after 4:00 p.m. may not be returned until the following business day.  We are closed weekends and major holidays. You have access to a nurse at all times for urgent questions. Please call the main number to the clinic [phone] and follow the prompts.  For any non-urgent questions, you may also contact your provider using MyChart. We now offer e-Visits for anyone 47 and older to request care online for non-urgent symptoms. For details visit [email or web address].   Also download the MyChart app! Go to the app store, search "MyChart", open the app, select [facility], and log in with your MyChart username and password.  Due to Covid, a mask is required upon entering the hospital/clinic. If you do not have a mask, one will be given to you upon arrival. For doctor visits, patients may have 1 support person aged 35 or older with them. For treatment visits, patients cannot have anyone with them due to current Covid guidelines and our immunocompromised population. Pembrolizumab injection What is this medication? PEMBROLIZUMAB (pem broe liz ue mab) is a monoclonal antibody. It is used to treat certain types of cancer. This medicine may be used for other purposes; ask your health care provider or pharmacist if you have questions. COMMON BRAND NAME(S): Keytruda What should I tell my care team before I  take this medication? They need to know if you have any of these conditions: autoimmune diseases like Crohn's disease, ulcerative colitis, or lupus have had or planning to have an allogeneic stem cell transplant (uses someone else's stem cells) history of organ transplant history  of chest radiation nervous system problems like myasthenia gravis or Guillain-Barre syndrome an unusual or allergic reaction to pembrolizumab, other medicines, foods, dyes, or preservatives pregnant or trying to get pregnant breast-feeding How should I use this medication? This medicine is for infusion into a vein. It is given by a health care professional in a hospital or clinic setting. A special MedGuide will be given to you before each treatment. Be sure to read this information carefully each time. Talk to your pediatrician regarding the use of this medicine in children. While this drug may be prescribed for children as young as 6 months for selected conditions, precautions do apply. Overdosage: If you think you have taken too much of this medicine contact a poison control center or emergency room at once. NOTE: This medicine is only for you. Do not share this medicine with others. What if I miss a dose? It is important not to miss your dose. Call your doctor or health care professional if you are unable to keep an appointment. What may interact with this medication? Interactions have not been studied. This list may not describe all possible interactions. Give your health care provider a list of all the medicines, herbs, non-prescription drugs, or dietary supplements you use. Also tell them if you smoke, drink alcohol, or use illegal drugs. Some items may interact with your medicine. What should I watch for while using this medication? Your condition will be monitored carefully while you are receiving this medicine. You may need blood work done while you are taking this medicine. Do not become pregnant while taking this medicine or for 4 months after stopping it. Women should inform their doctor if they wish to become pregnant or think they might be pregnant. There is a potential for serious side effects to an unborn child. Talk to your health care professional or pharmacist for more  information. Do not breast-feed an infant while taking this medicine or for 4 months after the last dose. What side effects may I notice from receiving this medication? Side effects that you should report to your doctor or health care professional as soon as possible: allergic reactions like skin rash, itching or hives, swelling of the face, lips, or tongue bloody or black, tarry breathing problems changes in vision chest pain chills confusion constipation cough diarrhea dizziness or feeling faint or lightheaded fast or irregular heartbeat fever flushing joint pain low blood counts - this medicine may decrease the number of white blood cells, red blood cells and platelets. You may be at increased risk for infections and bleeding. muscle pain muscle weakness pain, tingling, numbness in the hands or feet persistent headache redness, blistering, peeling or loosening of the skin, including inside the mouth signs and symptoms of high blood sugar such as dizziness; dry mouth; dry skin; fruity breath; nausea; stomach pain; increased hunger or thirst; increased urination signs and symptoms of kidney injury like trouble passing urine or change in the amount of urine signs and symptoms of liver injury like dark urine, light-colored stools, loss of appetite, nausea, right upper belly pain, yellowing of the eyes or skin sweating swollen lymph nodes weight loss Side effects that usually do not require medical attention (report to your doctor or health care  professional if they continue or are bothersome): decreased appetite hair loss tiredness This list may not describe all possible side effects. Call your doctor for medical advice about side effects. You may report side effects to FDA at 1-800-FDA-1088. Where should I keep my medication? This drug is given in a hospital or clinic and will not be stored at home. NOTE: This sheet is a summary. It may not cover all possible information. If you  have questions about this medicine, talk to your doctor, pharmacist, or health care provider.  2022 Elsevier/Gold Standard (2019-07-16 21:44:53)

## 2021-06-27 ENCOUNTER — Other Ambulatory Visit: Payer: Self-pay | Admitting: Oncology

## 2021-07-05 ENCOUNTER — Other Ambulatory Visit: Payer: Self-pay

## 2021-07-05 ENCOUNTER — Inpatient Hospital Stay: Payer: Medicare Other | Attending: Oncology

## 2021-07-05 ENCOUNTER — Encounter: Payer: Self-pay | Admitting: Oncology

## 2021-07-05 ENCOUNTER — Inpatient Hospital Stay (HOSPITAL_BASED_OUTPATIENT_CLINIC_OR_DEPARTMENT_OTHER): Payer: Medicare Other | Admitting: Oncology

## 2021-07-05 ENCOUNTER — Inpatient Hospital Stay: Payer: Medicare Other

## 2021-07-05 VITALS — BP 116/78 | HR 65 | Temp 97.6°F | Resp 18 | Wt 197.3 lb

## 2021-07-05 DIAGNOSIS — C771 Secondary and unspecified malignant neoplasm of intrathoracic lymph nodes: Secondary | ICD-10-CM | POA: Insufficient documentation

## 2021-07-05 DIAGNOSIS — C099 Malignant neoplasm of tonsil, unspecified: Secondary | ICD-10-CM

## 2021-07-05 DIAGNOSIS — C109 Malignant neoplasm of oropharynx, unspecified: Secondary | ICD-10-CM | POA: Diagnosis present

## 2021-07-05 DIAGNOSIS — Z5112 Encounter for antineoplastic immunotherapy: Secondary | ICD-10-CM | POA: Insufficient documentation

## 2021-07-05 DIAGNOSIS — Z79899 Other long term (current) drug therapy: Secondary | ICD-10-CM | POA: Insufficient documentation

## 2021-07-05 LAB — COMPREHENSIVE METABOLIC PANEL
ALT: 11 U/L (ref 0–44)
AST: 19 U/L (ref 15–41)
Albumin: 4.2 g/dL (ref 3.5–5.0)
Alkaline Phosphatase: 73 U/L (ref 38–126)
Anion gap: 10 (ref 5–15)
BUN: 19 mg/dL (ref 8–23)
CO2: 24 mmol/L (ref 22–32)
Calcium: 8.9 mg/dL (ref 8.9–10.3)
Chloride: 104 mmol/L (ref 98–111)
Creatinine, Ser: 0.91 mg/dL (ref 0.61–1.24)
GFR, Estimated: >60 mL/min (ref >60)
Glucose, Bld: 107 mg/dL — ABNORMAL HIGH (ref 70–99)
Potassium: 3.8 mmol/L (ref 3.5–5.1)
Sodium: 138 mmol/L (ref 135–145)
Total Bilirubin: 0.6 mg/dL (ref 0.3–1.2)
Total Protein: 7.3 g/dL (ref 6.5–8.1)

## 2021-07-05 LAB — CBC WITH DIFFERENTIAL/PLATELET
Abs Immature Granulocytes: 0.01 10*3/uL (ref 0.00–0.07)
Basophils Absolute: 0 10*3/uL (ref 0.0–0.1)
Basophils Relative: 1 %
Eosinophils Absolute: 0.4 10*3/uL (ref 0.0–0.5)
Eosinophils Relative: 5 %
HCT: 40.8 % (ref 39.0–52.0)
Hemoglobin: 13.8 g/dL (ref 13.0–17.0)
Immature Granulocytes: 0 %
Lymphocytes Relative: 23 %
Lymphs Abs: 1.5 10*3/uL (ref 0.7–4.0)
MCH: 32.9 pg (ref 26.0–34.0)
MCHC: 33.8 g/dL (ref 30.0–36.0)
MCV: 97.4 fL (ref 80.0–100.0)
Monocytes Absolute: 0.6 10*3/uL (ref 0.1–1.0)
Monocytes Relative: 9 %
Neutro Abs: 4 10*3/uL (ref 1.7–7.7)
Neutrophils Relative %: 62 %
Platelets: 222 10*3/uL (ref 150–400)
RBC: 4.19 MIL/uL — ABNORMAL LOW (ref 4.22–5.81)
RDW: 12.7 % (ref 11.5–15.5)
WBC: 6.5 10*3/uL (ref 4.0–10.5)
nRBC: 0 % (ref 0.0–0.2)

## 2021-07-05 MED ORDER — SODIUM CHLORIDE 0.9 % IV SOLN
Freq: Once | INTRAVENOUS | Status: AC
  Filled 2021-07-05: qty 250

## 2021-07-05 MED ORDER — SODIUM CHLORIDE 0.9 % IV SOLN
200.0000 mg | Freq: Once | INTRAVENOUS | Status: AC
  Administered 2021-07-05: 200 mg via INTRAVENOUS
  Filled 2021-07-05: qty 8

## 2021-07-05 MED ORDER — HEPARIN SOD (PORK) LOCK FLUSH 100 UNIT/ML IV SOLN
500.0000 [IU] | Freq: Once | INTRAVENOUS | Status: AC | PRN
  Administered 2021-07-05: 500 [IU]
  Filled 2021-07-05: qty 5

## 2021-07-05 MED ORDER — SODIUM CHLORIDE 0.9% FLUSH
10.0000 mL | INTRAVENOUS | Status: DC | PRN
  Administered 2021-07-05: 10 mL
  Filled 2021-07-05: qty 10

## 2021-07-05 MED ORDER — PROCHLORPERAZINE EDISYLATE 10 MG/2ML IJ SOLN
10.0000 mg | Freq: Once | INTRAMUSCULAR | Status: AC
  Administered 2021-07-05: 10 mg via INTRAVENOUS
  Filled 2021-07-05: qty 2

## 2021-07-05 NOTE — Patient Instructions (Signed)
[facility]  Discharge Instructions: Thank you for choosing [facility] to provide your oncology and hematology care.  If you have a lab appointment with the [facility], please go directly to the [facility] and check in at the registration area.  Wear comfortable clothing and clothing appropriate for easy access to any Portacath or PICC line.   We strive to give you quality time with your provider. You may need to reschedule your appointment if you arrive late (15 or more minutes).  Arriving late affects you and other patients whose appointments are after yours.  Also, if you miss three or more appointments without notifying the office, you may be dismissed from the clinic at the provider's discretion.      For prescription refill requests, have your pharmacy contact our office and allow 72 hours for refills to be completed.    Today you received the following chemotherapy and/or immunotherapy agents - pembrolizumab      To help prevent nausea and vomiting after your treatment, we encourage you to take your nausea medication as directed.  BELOW ARE SYMPTOMS THAT SHOULD BE REPORTED IMMEDIATELY: *FEVER GREATER THAN 100.4 F (38 C) OR HIGHER *CHILLS OR SWEATING *NAUSEA AND VOMITING THAT IS NOT CONTROLLED WITH YOUR NAUSEA MEDICATION *UNUSUAL SHORTNESS OF BREATH *UNUSUAL BRUISING OR BLEEDING *URINARY PROBLEMS (pain or burning when urinating, or frequent urination) *BOWEL PROBLEMS (unusual diarrhea, constipation, pain near the anus) TENDERNESS IN MOUTH AND THROAT WITH OR WITHOUT PRESENCE OF ULCERS (sore throat, sores in mouth, or a toothache) UNUSUAL RASH, SWELLING OR PAIN  UNUSUAL VAGINAL DISCHARGE OR ITCHING   Items with * indicate a potential emergency and should be followed up as soon as possible or go to the Emergency Department if any problems should occur.  Please show the CHEMOTHERAPY ALERT CARD or IMMUNOTHERAPY ALERT CARD at  check-in to the Emergency Department and triage nurse.  Should you have questions after your visit or need to cancel or reschedule your appointment, please contact [facility]  [phone] and follow the prompts.  Office hours are 8:00 a.m. to 4:30 p.m. Monday - Friday. Please note that voicemails left after 4:00 p.m. may not be returned until the following business day.  We are closed weekends and major holidays. You have access to a nurse at all times for urgent questions. Please call the main number to the clinic [phone] and follow the prompts.  For any non-urgent questions, you may also contact your provider using MyChart. We now offer e-Visits for anyone 58 and older to request care online for non-urgent symptoms. For details visit [email or web address].   Also download the MyChart app! Go to the app store, search "MyChart", open the app, select [facility], and log in with your MyChart username and password.  Due to Covid, a mask is required upon entering the hospital/clinic. If you do not have a mask, one will be given to you upon arrival. For doctor visits, patients may have 1 support person aged 8 or older with them. For treatment visits, patients cannot have anyone with them due to current Covid guidelines and our immunocompromised population.   Pembrolizumab injection What is this medication? PEMBROLIZUMAB (pem broe liz ue mab) is a monoclonal antibody. It is used to treat certain types of cancer. This medicine may be used for other purposes; ask your health care provider or pharmacist if you have questions. COMMON BRAND NAME(S): Keytruda What should I tell my care  team before I take this medication? They need to know if you have any of these conditions: autoimmune diseases like Crohn's disease, ulcerative colitis, or lupus have had or planning to have an allogeneic stem cell transplant (uses someone else's stem cells) history of organ  transplant history of chest radiation nervous system problems like myasthenia gravis or Guillain-Barre syndrome an unusual or allergic reaction to pembrolizumab, other medicines, foods, dyes, or preservatives pregnant or trying to get pregnant breast-feeding How should I use this medication? This medicine is for infusion into a vein. It is given by a health care professional in a hospital or clinic setting. A special MedGuide will be given to you before each treatment. Be sure to read this information carefully each time. Talk to your pediatrician regarding the use of this medicine in children. While this drug may be prescribed for children as young as 6 months for selected conditions, precautions do apply. Overdosage: If you think you have taken too much of this medicine contact a poison control center or emergency room at once. NOTE: This medicine is only for you. Do not share this medicine with others. What if I miss a dose? It is important not to miss your dose. Call your doctor or health care professional if you are unable to keep an appointment. What may interact with this medication? Interactions have not been studied. This list may not describe all possible interactions. Give your health care provider a list of all the medicines, herbs, non-prescription drugs, or dietary supplements you use. Also tell them if you smoke, drink alcohol, or use illegal drugs. Some items may interact with your medicine. What should I watch for while using this medication? Your condition will be monitored carefully while you are receiving this medicine. You may need blood work done while you are taking this medicine. Do not become pregnant while taking this medicine or for 4 months after stopping it. Women should inform their doctor if they wish to become pregnant or think they might be pregnant. There is a potential for serious side effects to an unborn child. Talk to your health care professional or  pharmacist for more information. Do not breast-feed an infant while taking this medicine or for 4 months after the last dose. What side effects may I notice from receiving this medication? Side effects that you should report to your doctor or health care professional as soon as possible: allergic reactions like skin rash, itching or hives, swelling of the face, lips, or tongue bloody or black, tarry breathing problems changes in vision chest pain chills confusion constipation cough diarrhea dizziness or feeling faint or lightheaded fast or irregular heartbeat fever flushing joint pain low blood counts - this medicine may decrease the number of white blood cells, red blood cells and platelets. You may be at increased risk for infections and bleeding. muscle pain muscle weakness pain, tingling, numbness in the hands or feet persistent headache redness, blistering, peeling or loosening of the skin, including inside the mouth signs and symptoms of high blood sugar such as dizziness; dry mouth; dry skin; fruity breath; nausea; stomach pain; increased hunger or thirst; increased urination signs and symptoms of kidney injury like trouble passing urine or change in the amount of urine signs and symptoms of liver injury like dark urine, light-colored stools, loss of appetite, nausea, right upper belly pain, yellowing of the eyes or skin sweating swollen lymph nodes weight loss Side effects that usually do not require medical attention (report to your doctor  or health care professional if they continue or are bothersome): decreased appetite hair loss tiredness This list may not describe all possible side effects. Call your doctor for medical advice about side effects. You may report side effects to FDA at 1-800-FDA-1088. Where should I keep my medication? This drug is given in a hospital or clinic and will not be stored at home. NOTE: This sheet is a summary. It may not cover all possible  information. If you have questions about this medicine, talk to your doctor, pharmacist, or health care provider.  2022 Elsevier/Gold Standard (2021-05-03 00:00:00)

## 2021-07-05 NOTE — Progress Notes (Signed)
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 Richardson Landry for symptoms of sore throat and difficulty swallowing which she has been experiencing over the last 1 year.He underwent a comprehensive auto laryngoscopy exam which showed a firm enlarged right tonsillar mass with necrotic area extending onto the adjacent soft palate into the root of the uvula.  Hypopharynx and larynx as well as nasopharynx was unable to be visualized due to gag reflex.  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   PET CT scan showed large hypermetabolic right palatine tonsil mass which crosses the midline.  Ipsilateral cervical nodal metastases.  Bilateral pulmonary nodules including a dominant 1 cm hypermetabolic right lung base nodule.  Multiplicity favor pulmonary metastases.  Subcarinal and equivocal left hilar nodal metastases.  Hypermetabolism involving the ascending colon.  Question mild diverticulitis versus underlying colonic mass or polyp cannot be excluded     Patient underwent EBUS guided biopsy of hilar lymph nodes.  Biopsy showed squamous cell carcinoma.  However HPV/p16 status was uninterpretable.    Patient completed 6 cycles of carbotaxol chemotherapy along with Keytruda and is currently on maintenance Keytruda.      Interval history-patient reports tolerating Keytruda  well and denies any specific complaints at this time  ECOG PS- 1 Pain scale- 0   Review of systems- Review of Systems  Constitutional:  Negative for chills, fever, malaise/fatigue and weight loss.  HENT:  Negative for congestion, ear discharge and nosebleeds.   Eyes:  Negative for blurred vision.  Respiratory:  Negative for cough, hemoptysis, sputum production, shortness of breath and wheezing.   Cardiovascular:  Negative for chest pain, palpitations, orthopnea and claudication.  Gastrointestinal:  Negative for abdominal pain, blood in stool, constipation, diarrhea, heartburn, melena, nausea and vomiting.  Genitourinary:  Negative for dysuria, flank pain, frequency, hematuria and urgency.  Musculoskeletal:  Negative for back pain, joint pain and myalgias.  Skin:  Negative for rash.  Neurological:  Negative for dizziness, tingling, focal weakness, seizures, weakness and headaches.  Endo/Heme/Allergies:  Does not bruise/bleed easily.  Psychiatric/Behavioral:  Negative for depression and suicidal ideas. The patient does not have insomnia.      No Known Allergies   Past Medical History:  Diagnosis Date   Asthma    Lung nodules    Metastasis to lung (HCC)    tonsil cancer and mets to lung   Tonsil cancer ([place])    Tonsil cancer [facility])      Past Surgical History:  Procedure Laterality Date   CERVICAL SPINE SURGERY     LUMBAR SPINE SURGERY     PORTA CATH INSERTION N/A 10/08/2019   Procedure: PORTA CATH INSERTION;  Surgeon: Katha Cabal, MD;  Location: [place] [facility];  Service: Cardiovascular;  Laterality: N/A;   VIDEO BRONCHOSCOPY WITH ENDOBRONCHIAL NAVIGATION N/A 10/10/2019   Procedure: VIDEO BRONCHOSCOPY WITH ENDOBRONCHIAL NAVIGATION;  Surgeon: Tyler Pita, MD;  Location: [facility];  Service: Pulmonary;  Laterality: N/A;   VIDEO BRONCHOSCOPY WITH ENDOBRONCHIAL ULTRASOUND N/A 10/10/2019   Procedure: VIDEO BRONCHOSCOPY WITH ENDOBRONCHIAL ULTRASOUND;  Surgeon:  Tyler Pita, MD;  Location: [facility];  Service: Pulmonary;  Laterality: N/A;    Social History   Socioeconomic History   Marital status: Widowed    Spouse name: Not on file   Number of children: Not on file   Years of education: Not on file   Highest education level: Not on file  Occupational History   Not on file  Tobacco Use   Smoking status: Never   Smokeless tobacco: Never  Vaping Use   Vaping Use: Never used  Substance and Sexual Activity   Alcohol use: Never   Drug use: Never   Sexual activity: Not Currently  Other Topics Concern   Not on file  Social History Narrative   Not on file   Social Determinants of Health   Financial Resource Strain: Not on file  Food Insecurity: Not on file  Transportation Needs: Not on file  Physical Activity: Not on file  Stress: Not on file  Social Connections: Not on file  Intimate Partner Violence: Not on file    Family History  Problem Relation Age of Onset   Cancer Mother 77       not sure what type   Prostate cancer Father    Rheum arthritis Father    Healthy Sister    Healthy Brother    Healthy Sister    Healthy Brother    Healthy Brother    Heart disease Brother    Heart disease Brother      Current Outpatient Medications:    acetaminophen (TYLENOL) 500 MG tablet, Take 500-1,000 mg by mouth every 8 (eight) hours as needed (for pain.). , Disp: , Rfl:    oxyCODONE (OXY IR/ROXICODONE) 5 MG immediate release tablet, Take 1 tablet (5 mg total) by mouth every 4 (four) hours as needed for severe pain., Disp: 60 tablet, Rfl: 0   SYMBICORT 160-4.5 MCG/ACT inhaler, Inhale 2 puffs by mouth twice daily, Disp: 11 g, Rfl: 0   EPINEPHrine (PRIMATENE MIST IN), Inhale 2 puffs into the lungs as needed (wheezing or sob). (Patient not taking: Reported on 07/05/2021), Disp: , Rfl:  No current facility-administered medications for this visit.  Facility-Administered Medications Ordered in Other Visits:    heparin lock flush 100  unit/mL, 500 Units, Intracatheter, Once PRN, Sindy Guadeloupe, MD   pembrolizumab [facility]) 200 mg in sodium chloride 0.9 % 50 mL chemo infusion, 200 mg, Intravenous, Once, Sindy Guadeloupe, MD   sodium chloride flush (NS) 0.9 % injection 10 mL, 10 mL, Intravenous, PRN, Sindy Guadeloupe, MD, 10 mL at 03/09/20 0919   sodium chloride flush (NS) 0.9 % injection 10 mL, 10 mL, Intracatheter, PRN, Sindy Guadeloupe, MD  Physical exam:  Vitals:   07/05/21 0848  BP: 116/78  Pulse: 65  Resp: 18  Temp: 97.6 F (36.4 C)  SpO2: 99%  Weight: 197 lb 4.8 oz (89.5 kg)   Physical Exam Cardiovascular:     Rate and Rhythm: Normal rate and regular rhythm.     Heart sounds: Normal heart sounds.  Pulmonary:     Effort: Pulmonary effort is normal.     Breath sounds: Normal breath sounds.  Abdominal:     General: Bowel sounds are normal.     Palpations: Abdomen is soft.  Skin:    General: Skin is warm  and dry.  Neurological:     Mental Status: He is alert and oriented to person, place, and time.     CMP Latest Ref Rng & Units 07/05/2021  Glucose 70 - 99 mg/dL 107(H)  BUN 8 - 23 mg/dL 19  Creatinine 0.61 - 1.24 mg/dL 0.91  Sodium 135 - 145 mmol/L 138  Potassium 3.5 - 5.1 mmol/L 3.8  Chloride 98 - 111 mmol/L 104  CO2 22 - 32 mmol/L 24  Calcium 8.9 - 10.3 mg/dL 8.9  Total Protein 6.5 - 8.1 g/dL 7.3  Total Bilirubin 0.3 - 1.2 mg/dL 0.6  Alkaline Phos 38 - 126 U/L 73  AST 15 - 41 U/L 19  ALT 0 - 44 U/L 11   CBC Latest Ref Rng & Units 07/05/2021  WBC 4.0 - 10.5 K/uL 6.5  Hemoglobin 13.0 - 17.0 g/dL 13.8  Hematocrit 39.0 - 52.0 % 40.8  Platelets 150 - 400 K/uL 222      Assessment and plan- Patient is a 70 y.o. 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  He is here for on treatment assessment prior to cycle 25 of palliative Keytruda  Counts okay to proceed with cycle 25 of palliative Keytruda today.  He is due for  repeat scans next week CT chest abdomen pelvis with contrast I will see him back in 3 weeks for cycle 26.June 2021 and it would be reasonable to give him 2 years of maintenance treatment before considering a break for stable disease   Visit Diagnosis 1. Encounter for antineoplastic immunotherapy   2. 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 Randa Evens, MD, MPH  [facility] [phone] 07/05/2021 10:10 AM

## 2021-07-15 ENCOUNTER — Other Ambulatory Visit: Payer: Self-pay

## 2021-07-15 ENCOUNTER — Ambulatory Visit
Admission: RE | Admit: 2021-07-15 | Discharge: 2021-07-15 | Disposition: A | Discharge status: Home / Self Care | Payer: Medicare Other | Source: Ambulatory Visit | Attending: Oncology | Admitting: Oncology

## 2021-07-15 DIAGNOSIS — I700 Atherosclerosis of aorta: Secondary | ICD-10-CM | POA: Insufficient documentation

## 2021-07-15 DIAGNOSIS — K4090 Unilateral inguinal hernia, without obstruction or gangrene, not specified as recurrent: Secondary | ICD-10-CM | POA: Diagnosis not present

## 2021-07-15 DIAGNOSIS — K5730 Diverticulosis of large intestine without perforation or abscess without bleeding: Secondary | ICD-10-CM | POA: Diagnosis not present

## 2021-07-15 DIAGNOSIS — N200 Calculus of kidney: Secondary | ICD-10-CM | POA: Diagnosis not present

## 2021-07-15 DIAGNOSIS — C099 Malignant neoplasm of tonsil, unspecified: Secondary | ICD-10-CM | POA: Diagnosis present

## 2021-07-20 ENCOUNTER — Encounter: Payer: Self-pay | Admitting: Emergency Medicine

## 2021-07-20 ENCOUNTER — Other Ambulatory Visit: Payer: Self-pay

## 2021-07-20 ENCOUNTER — Ambulatory Visit
Admission: EM | Admit: 2021-07-20 | Discharge: 2021-07-20 | Disposition: A | Discharge status: Home / Self Care | Payer: Medicare Other | Attending: Emergency Medicine | Admitting: Emergency Medicine

## 2021-07-20 DIAGNOSIS — K4090 Unilateral inguinal hernia, without obstruction or gangrene, not specified as recurrent: Secondary | ICD-10-CM | POA: Diagnosis not present

## 2021-07-20 NOTE — ED Triage Notes (Addendum)
Pt states he was seen at [facility] ER on 07/17/21. He states he had multiple test and showed he has a hernia. Pt states he is still having pain and can still feel the hernia. He states the pain is in her lower abdomen into the groin area on both sides. He states he does have some nausea still. But the pain is better than it was. 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

## 2021-07-20 NOTE — ED Provider Notes (Signed)
[facility]    CSN: [id number] Arrival date & time: 07/20/21  1025      History   Chief Complaint Chief Complaint  Patient presents with   Hernia    HPI Patrick Mendoza. is a 70 y.o. male.   Patient presents with inguinal hernia requesting information on treatment.  Was seen in the emergency department [facility] on 07/17/2021 for evaluation of groin pain.  Endorses that he received blood work and imaging which resulted in a diagnosis of an inguinal hernia.  Endorses that he was told to follow-up with someone for treatment.  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  Endorses pain intermittently occurs to the bilateral groins but has been improved since seen in ED.  Denies tenderness, swelling, fever, chills, nausea, vomiting.  Past Medical History:  Diagnosis Date   Asthma    Lung nodules    Metastasis to lung (HCC)    tonsil cancer and mets to lung   Tonsil cancer ([place])    Tonsil cancer [facility])     Patient Active Problem List   Diagnosis Date Noted   Severe protein-calorie malnutrition ([place]) 11/14/2019   Asthma 09/23/2019   Goals of care, counseling/discussion 09/23/2019   Primary squamous cell carcinoma of palatine tonsil ([place]) 09/23/2019    Past Surgical History:  Procedure Laterality Date   CERVICAL SPINE SURGERY     LUMBAR SPINE SURGERY     PORTA CATH INSERTION N/A 10/08/2019   Procedure: PORTA CATH INSERTION;  Surgeon: Katha Cabal, MD;  Location: [place] [facility];  Service: Cardiovascular;  Laterality: N/A;   VIDEO BRONCHOSCOPY WITH ENDOBRONCHIAL NAVIGATION N/A 10/10/2019   Procedure: VIDEO BRONCHOSCOPY WITH ENDOBRONCHIAL NAVIGATION;  Surgeon: Tyler Pita, MD;  Location: [facility];  Service: Pulmonary;  Laterality: N/A;   VIDEO BRONCHOSCOPY WITH ENDOBRONCHIAL ULTRASOUND N/A 10/10/2019   Procedure: VIDEO BRONCHOSCOPY WITH ENDOBRONCHIAL ULTRASOUND;  Surgeon: Tyler Pita, MD;  Location: [facility];  Service:  Pulmonary;  Laterality: N/A;       Home Medications    Prior to Admission medications   Medication Sig Start Date End Date Taking? Authorizing Provider  acetaminophen (TYLENOL) 500 MG tablet Take 500-1,000 mg by mouth every 8 (eight) hours as needed (for pain.).    Yes [provider]  oxyCODONE (OXY IR/ROXICODONE) 5 MG immediate release tablet Take 1 tablet (5 mg total) by mouth every 4 (four) hours as needed for severe pain. 05/24/21  Yes Sindy Guadeloupe, MD  SYMBICORT 160-4.5 MCG/ACT inhaler Inhale 2 puffs by mouth twice daily 06/27/21  Yes Sindy Guadeloupe, MD  EPINEPHrine (PRIMATENE MIST IN) Inhale 2 puffs into the lungs as needed (wheezing or sob). Patient not taking: Reported on 07/05/2021    [provider]    Family History Family History  Problem Relation Age of Onset   Cancer Mother 53       not sure what type   Prostate cancer Father    Rheum arthritis Father    Healthy Sister    Healthy Brother    Healthy Sister    Healthy Brother    Healthy Brother    Heart disease Brother    Heart disease Brother     Social History Social History   Tobacco Use   Smoking status: Never   Smokeless tobacco: Never  Vaping Use   Vaping Use: Never used  Substance Use Topics   Alcohol use: Never   Drug  use: Never     Allergies   Patient has no known allergies.   Review of Systems Review of Systems  Constitutional: Negative.   Respiratory: Negative.    Cardiovascular: Negative.   Gastrointestinal: Negative.   Skin: Negative.   Neurological: Negative.     Physical Exam Triage Vital Signs ED Triage Vitals  Enc Vitals Group     BP 07/20/21 1116 114/67     Pulse Rate 07/20/21 1116 66     Resp 07/20/21 1116 18     Temp 07/20/21 1116 98.6 F (37 C)     Temp Source 07/20/21 1116 Oral     SpO2 07/20/21 1116 97 %     Weight 07/20/21 1117 197 lb 5 oz (89.5 kg)     Height 07/20/21 1117 5\' 10"  (1.778 m)     Head Circumference --      Peak Flow --       Pain Score 07/20/21 1117 3     Pain Loc --      Pain Edu? --      Excl. in [place]? --    No data found.  Updated Vital Signs BP 114/67 (BP Location: Left Arm)   Pulse 66   Temp 98.6 F (37 C) (Oral)   Resp 18   Ht 5\' 10"  (1.778 m)   Wt 197 lb 5 oz (89.5 kg)   SpO2 97%   BMI 28.31 kg/m   Visual Acuity Right Eye Distance:   Left Eye Distance:   Bilateral Distance:    Right Eye Near:   Left Eye Near:    Bilateral Near:     Physical Exam Constitutional:      Appearance: Normal appearance. He is normal weight.  Abdominal:     General: Abdomen is flat. Bowel sounds are normal.     Palpations: Abdomen is soft.     Tenderness: There is no abdominal tenderness.     Comments: Unable to feel hernia  Neurological:     Mental Status: He is alert.  Psychiatric:        Mood and Affect: Mood normal.        Behavior: Behavior normal.     UC Treatments / Results  Labs (all labs ordered are listed, but only abnormal results are displayed) Labs Reviewed - No data to display  EKG   Radiology No results found.  Procedures Procedures (including critical care time)  Medications Ordered in UC Medications - No data to display  Initial Impression / Assessment and Plan / UC Course  I have reviewed the triage vital signs and the nursing notes.  Pertinent labs & imaging results that were available during my care of the patient were reviewed by me and considered in my medical decision making (see chart for details).  Unilateral inguinal hernia without obstruction requiring going  Hernia confirmed by imaging on 07/17/2021 in the emergency department, unable to feel hernia on exam today, patient endorses that hernia was placed into abdomen in ED and he has been unable to feel the bulge but can feels pressure, given internal referral to [facility], patient to call in 1 week if he has not heard from practice, patient given warning signs for complications and strangulation of  hernia to go to the nearest emergency department for further evaluation Final Clinical Impressions(s) / UC Diagnoses   Final diagnoses:  Unilateral inguinal hernia without obstruction or gangrene, recurrence not specified     Discharge Instructions  A referral has been placed for you for [place] [facility], someone should call you to help you set up an appointment  If you have not heard from someone by Wednesday of next week please call their office, information is listed below  You have a fever or chills. You have belly pain that gets worse. You feel like you may vomit, or you vomit. Your hernia cannot be pushed in by gently pressing on it when you are lying down. Your hernia: Changes in shape or size. Changes color. Feels hard, or it hurts when you touch it.ease go to the nearest emergency department at any point if any of the following symptoms occur    ED Prescriptions   None    PDMP not reviewed this encounter.   Hans Eden, NP 07/20/21 1146

## 2021-07-20 NOTE — Discharge Instructions (Signed)
A referral has been placed for you for [facility] [facility], someone should call you to help you set up an appointment  If you have not heard from someone by Wednesday of next week please call their office, information is listed below  You have a fever or chills. You have belly pain that gets worse. You feel like you may vomit, or you vomit. Your hernia cannot be pushed in by gently pressing on it when you are lying down. Your hernia: Changes in shape or size. Changes color. Feels hard, or it hurts when you touch it.ease go to the nearest emergency department at any point if any of the following symptoms occur

## 2021-07-25 ENCOUNTER — Encounter: Payer: Self-pay | Admitting: Oncology

## 2021-07-26 ENCOUNTER — Inpatient Hospital Stay: Payer: Medicare Other

## 2021-07-26 ENCOUNTER — Inpatient Hospital Stay (HOSPITAL_BASED_OUTPATIENT_CLINIC_OR_DEPARTMENT_OTHER): Payer: Medicare Other | Admitting: Oncology

## 2021-07-26 ENCOUNTER — Other Ambulatory Visit: Payer: Self-pay

## 2021-07-26 ENCOUNTER — Encounter: Payer: Self-pay | Admitting: Oncology

## 2021-07-26 VITALS — BP 120/84 | HR 70 | Temp 97.8°F | Resp 16 | Ht 70.0 in | Wt 199.4 lb

## 2021-07-26 DIAGNOSIS — Z5112 Encounter for antineoplastic immunotherapy: Secondary | ICD-10-CM | POA: Diagnosis not present

## 2021-07-26 DIAGNOSIS — C099 Malignant neoplasm of tonsil, unspecified: Secondary | ICD-10-CM

## 2021-07-26 DIAGNOSIS — Z95828 Presence of other vascular implants and grafts: Secondary | ICD-10-CM

## 2021-07-26 LAB — COMPREHENSIVE METABOLIC PANEL
ALT: 12 U/L (ref 0–44)
AST: 20 U/L (ref 15–41)
Albumin: 3.9 g/dL (ref 3.5–5.0)
Alkaline Phosphatase: 85 U/L (ref 38–126)
Anion gap: 7 (ref 5–15)
BUN: 19 mg/dL (ref 8–23)
CO2: 24 mmol/L (ref 22–32)
Calcium: 8.7 mg/dL — ABNORMAL LOW (ref 8.9–10.3)
Chloride: 106 mmol/L (ref 98–111)
Creatinine, Ser: 1.1 mg/dL (ref 0.61–1.24)
GFR, Estimated: >60 mL/min (ref >60)
Glucose, Bld: 108 mg/dL — ABNORMAL HIGH (ref 70–99)
Potassium: 4.1 mmol/L (ref 3.5–5.1)
Sodium: 137 mmol/L (ref 135–145)
Total Bilirubin: 0.5 mg/dL (ref 0.3–1.2)
Total Protein: 7.1 g/dL (ref 6.5–8.1)

## 2021-07-26 LAB — CBC WITH DIFFERENTIAL/PLATELET
Abs Immature Granulocytes: 0.02 10*3/uL (ref 0.00–0.07)
Basophils Absolute: 0 10*3/uL (ref 0.0–0.1)
Basophils Relative: 0 %
Eosinophils Absolute: 0.5 10*3/uL (ref 0.0–0.5)
Eosinophils Relative: 8 %
HCT: 41.4 % (ref 39.0–52.0)
Hemoglobin: 13.8 g/dL (ref 13.0–17.0)
Immature Granulocytes: 0 %
Lymphocytes Relative: 20 %
Lymphs Abs: 1.4 10*3/uL (ref 0.7–4.0)
MCH: 32.2 pg (ref 26.0–34.0)
MCHC: 33.3 g/dL (ref 30.0–36.0)
MCV: 96.7 fL (ref 80.0–100.0)
Monocytes Absolute: 0.7 10*3/uL (ref 0.1–1.0)
Monocytes Relative: 10 %
Neutro Abs: 4.2 10*3/uL (ref 1.7–7.7)
Neutrophils Relative %: 62 %
Platelets: 241 10*3/uL (ref 150–400)
RBC: 4.28 MIL/uL (ref 4.22–5.81)
RDW: 12.6 % (ref 11.5–15.5)
WBC: 6.7 10*3/uL (ref 4.0–10.5)
nRBC: 0 % (ref 0.0–0.2)

## 2021-07-26 MED ORDER — HEPARIN SOD (PORK) LOCK FLUSH 100 UNIT/ML IV SOLN
500.0000 [IU] | Freq: Once | INTRAVENOUS | Status: DC
  Administered 2021-07-26: 500 [IU] via INTRAVENOUS
  Filled 2021-07-26: qty 5

## 2021-07-26 MED ORDER — SODIUM CHLORIDE 0.9 % IV SOLN
200.0000 mg | Freq: Once | INTRAVENOUS | Status: AC
  Administered 2021-07-26: 200 mg via INTRAVENOUS
  Filled 2021-07-26: qty 8

## 2021-07-26 MED ORDER — SODIUM CHLORIDE 0.9 % IV SOLN
Freq: Once | INTRAVENOUS | Status: AC
  Filled 2021-07-26: qty 250

## 2021-07-26 MED ORDER — SODIUM CHLORIDE 0.9% FLUSH
10.0000 mL | Freq: Once | INTRAVENOUS | Status: DC
  Administered 2021-07-26: 10 mL via INTRAVENOUS
  Filled 2021-07-26: qty 10

## 2021-07-26 MED ORDER — PROCHLORPERAZINE EDISYLATE 10 MG/2ML IJ SOLN
10.0000 mg | Freq: Once | INTRAMUSCULAR | Status: AC
  Administered 2021-07-26: 10 mg via INTRAVENOUS

## 2021-07-26 NOTE — Patient Instructions (Signed)
[facility]  Discharge Instructions: Thank you for choosing [facility] to provide your oncology and hematology care.  If you have a lab appointment with the [facility], please go directly to the [facility] and check in at the registration area.  Wear comfortable clothing and clothing appropriate for easy access to any Portacath or PICC line.   We strive to give you quality time with your provider. You may need to reschedule your appointment if you arrive late (15 or more minutes).  Arriving late affects you and other patients whose appointments are after yours.  Also, if you miss three or more appointments without notifying the office, you may be dismissed from the clinic at the provider's discretion.      For prescription refill requests, have your pharmacy contact our office and allow 72 hours for refills to be completed.    Today you received the following chemotherapy and/or immunotherapy agents KEYTRUDA      To help prevent nausea and vomiting after your treatment, we encourage you to take your nausea medication as directed.  BELOW ARE SYMPTOMS THAT SHOULD BE REPORTED IMMEDIATELY: *FEVER GREATER THAN 100.4 F (38 C) OR HIGHER *CHILLS OR SWEATING *NAUSEA AND VOMITING THAT IS NOT CONTROLLED WITH YOUR NAUSEA MEDICATION *UNUSUAL SHORTNESS OF BREATH *UNUSUAL BRUISING OR BLEEDING *URINARY PROBLEMS (pain or burning when urinating, or frequent urination) *BOWEL PROBLEMS (unusual diarrhea, constipation, pain near the anus) TENDERNESS IN MOUTH AND THROAT WITH OR WITHOUT PRESENCE OF ULCERS (sore throat, sores in mouth, or a toothache) UNUSUAL RASH, SWELLING OR PAIN  UNUSUAL VAGINAL DISCHARGE OR ITCHING   Items with * indicate a potential emergency and should be followed up as soon as possible or go to the Emergency Department if any problems should occur.  Please show the CHEMOTHERAPY ALERT CARD or IMMUNOTHERAPY ALERT CARD at check-in to  the Emergency Department and triage nurse.  Should you have questions after your visit or need to cancel or reschedule your appointment, please contact [facility]  [phone] and follow the prompts.  Office hours are 8:00 a.m. to 4:30 p.m. Monday - Friday. Please note that voicemails left after 4:00 p.m. may not be returned until the following business day.  We are closed weekends and major holidays. You have access to a nurse at all times for urgent questions. Please call the main number to the clinic [phone] and follow the prompts.  For any non-urgent questions, you may also contact your provider using MyChart. We now offer e-Visits for anyone 20 and older to request care online for non-urgent symptoms. For details visit [email or web address].   Also download the MyChart app! Go to the app store, search "MyChart", open the app, select [facility], and log in with your MyChart username and password.  Due to Covid, a mask is required upon entering the hospital/clinic. If you do not have a mask, one will be given to you upon arrival. For doctor visits, patients may have 1 support person aged 3 or older with them. For treatment visits, patients cannot have anyone with them due to current Covid guidelines and our immunocompromised population.   Pembrolizumab injection What is this medication? PEMBROLIZUMAB (pem broe liz ue mab) is a monoclonal antibody. It is used to treat certain types of cancer. This medicine may be used for other purposes; ask your health care provider or pharmacist if you have questions. COMMON BRAND NAME(S): Keytruda What should I tell my care team  before I take this medication? They need to know if you have any of these conditions: autoimmune diseases like Crohn's disease, ulcerative colitis, or lupus have had or planning to have an allogeneic stem cell transplant (uses someone else's stem cells) history of organ  transplant history of chest radiation nervous system problems like myasthenia gravis or Guillain-Barre syndrome an unusual or allergic reaction to pembrolizumab, other medicines, foods, dyes, or preservatives pregnant or trying to get pregnant breast-feeding How should I use this medication? This medicine is for infusion into a vein. It is given by a health care professional in a hospital or clinic setting. A special MedGuide will be given to you before each treatment. Be sure to read this information carefully each time. Talk to your pediatrician regarding the use of this medicine in children. While this drug may be prescribed for children as young as 6 months for selected conditions, precautions do apply. Overdosage: If you think you have taken too much of this medicine contact a poison control center or emergency room at once. NOTE: This medicine is only for you. Do not share this medicine with others. What if I miss a dose? It is important not to miss your dose. Call your doctor or health care professional if you are unable to keep an appointment. What may interact with this medication? Interactions have not been studied. This list may not describe all possible interactions. Give your health care provider a list of all the medicines, herbs, non-prescription drugs, or dietary supplements you use. Also tell them if you smoke, drink alcohol, or use illegal drugs. Some items may interact with your medicine. What should I watch for while using this medication? Your condition will be monitored carefully while you are receiving this medicine. You may need blood work done while you are taking this medicine. Do not become pregnant while taking this medicine or for 4 months after stopping it. Women should inform their doctor if they wish to become pregnant or think they might be pregnant. There is a potential for serious side effects to an unborn child. Talk to your health care professional or  pharmacist for more information. Do not breast-feed an infant while taking this medicine or for 4 months after the last dose. What side effects may I notice from receiving this medication? Side effects that you should report to your doctor or health care professional as soon as possible: allergic reactions like skin rash, itching or hives, swelling of the face, lips, or tongue bloody or black, tarry breathing problems changes in vision chest pain chills confusion constipation cough diarrhea dizziness or feeling faint or lightheaded fast or irregular heartbeat fever flushing joint pain low blood counts - this medicine may decrease the number of white blood cells, red blood cells and platelets. You may be at increased risk for infections and bleeding. muscle pain muscle weakness pain, tingling, numbness in the hands or feet persistent headache redness, blistering, peeling or loosening of the skin, including inside the mouth signs and symptoms of high blood sugar such as dizziness; dry mouth; dry skin; fruity breath; nausea; stomach pain; increased hunger or thirst; increased urination signs and symptoms of kidney injury like trouble passing urine or change in the amount of urine signs and symptoms of liver injury like dark urine, light-colored stools, loss of appetite, nausea, right upper belly pain, yellowing of the eyes or skin sweating swollen lymph nodes weight loss Side effects that usually do not require medical attention (report to your doctor or  health care professional if they continue or are bothersome): decreased appetite hair loss tiredness This list may not describe all possible side effects. Call your doctor for medical advice about side effects. You may report side effects to FDA at 1-800-FDA-1088. Where should I keep my medication? This drug is given in a hospital or clinic and will not be stored at home. NOTE: This sheet is a summary. It may not cover all possible  information. If you have questions about this medicine, talk to your doctor, pharmacist, or health care provider.  2022 Elsevier/Gold Standard (2021-05-03 00:00:00)

## 2021-07-26 NOTE — Progress Notes (Signed)
Pt doing good except for inguinal hernia and it causing some pain and the swallowing is still discomfort. Eating ok. Bowels ok. Urinating ok

## 2021-07-26 NOTE — Progress Notes (Signed)
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 Richardson Landry for symptoms of sore throat and difficulty swallowing which she has been experiencing over the last 1 year.He underwent a comprehensive auto laryngoscopy exam which showed a firm enlarged right tonsillar mass with necrotic area extending onto the adjacent soft palate into the root of the uvula.  Hypopharynx and larynx as well as nasopharynx was unable to be visualized due to gag reflex.  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   PET CT scan showed large hypermetabolic right palatine tonsil mass which crosses the midline.  Ipsilateral cervical nodal metastases.  Bilateral pulmonary nodules including a dominant 1 cm hypermetabolic right lung base nodule.  Multiplicity favor pulmonary metastases.  Subcarinal and equivocal left hilar nodal metastases.  Hypermetabolism involving the ascending colon.  Question mild diverticulitis versus underlying colonic mass or polyp cannot be excluded     Patient underwent EBUS guided biopsy of hilar lymph nodes.  Biopsy showed squamous cell carcinoma.  However HPV/p16 status was uninterpretable.    Patient completed 6 cycles of carbotaxol chemotherapy along with Keytruda and is currently on maintenance Keytruda.      Interval history-overall tolerating treatment well.  Has  occasional low back pain as well as pain during swallowing occasionally for which she uses as needed oxycodone.  ECOG PS- 1 Pain scale- 0 Opioid associated constipation- no  Review of systems- Review of Systems  Constitutional:  Negative for chills, fever, malaise/fatigue and weight loss.  HENT:  Negative for congestion, ear discharge and nosebleeds.   Eyes:  Negative for blurred vision.  Respiratory:  Negative for cough, hemoptysis, sputum production, shortness of breath and wheezing.   Cardiovascular:  Negative for chest pain, palpitations, orthopnea and claudication.  Gastrointestinal:  Negative for abdominal pain, blood in stool, constipation, diarrhea, heartburn, melena, nausea and vomiting.  Genitourinary:  Negative for dysuria, flank pain, frequency, hematuria and urgency.  Musculoskeletal:  Positive for back pain. Negative for joint pain and myalgias.  Skin:  Negative for rash.  Neurological:  Negative for dizziness, tingling, focal weakness, seizures, weakness and headaches.  Endo/Heme/Allergies:  Does not bruise/bleed easily.  Psychiatric/Behavioral:  Negative for depression and suicidal ideas. The patient does not have insomnia.       No Known Allergies   Past Medical History:  Diagnosis Date   Asthma    Inguinal hernia recurrent unilateral    Lung nodules    Metastasis to lung (HCC)    tonsil cancer and mets to lung   Tonsil cancer ([employer])    Tonsil cancer [facility])      Past Surgical History:  Procedure Laterality Date   CERVICAL SPINE SURGERY     LUMBAR SPINE SURGERY     PORTA CATH INSERTION N/A 10/08/2019   Procedure: PORTA CATH INSERTION;  Surgeon: Katha Cabal, MD;  Location: [place] [facility];  Service: Cardiovascular;  Laterality: N/A;  VIDEO BRONCHOSCOPY WITH ENDOBRONCHIAL NAVIGATION N/A 10/10/2019   Procedure: VIDEO BRONCHOSCOPY WITH ENDOBRONCHIAL NAVIGATION;  Surgeon: Tyler Pita, MD;  Location: [facility];  Service: Pulmonary;  Laterality:  N/A;   VIDEO BRONCHOSCOPY WITH ENDOBRONCHIAL ULTRASOUND N/A 10/10/2019   Procedure: VIDEO BRONCHOSCOPY WITH ENDOBRONCHIAL ULTRASOUND;  Surgeon: Tyler Pita, MD;  Location: [facility];  Service: Pulmonary;  Laterality: N/A;    Social History   Socioeconomic History   Marital status: Widowed    Spouse name: Not on file   Number of children: Not on file   Years of education: Not on file   Highest education level: Not on file  Occupational History   Not on file  Tobacco Use   Smoking status: Never   Smokeless tobacco: Never  Vaping Use   Vaping Use: Never used  Substance and Sexual Activity   Alcohol use: Never   Drug use: Never   Sexual activity: Not Currently  Other Topics Concern   Not on file  Social History Narrative   Not on file   Social Determinants of Health   Financial Resource Strain: Not on file  Food Insecurity: Not on file  Transportation Needs: Not on file  Physical Activity: Not on file  Stress: Not on file  Social Connections: Not on file  Intimate Partner Violence: Not on file    Family History  Problem Relation Age of Onset   Cancer Mother 58       not sure what type   Prostate cancer Father    Rheum arthritis Father    Healthy Sister    Healthy Brother    Healthy Sister    Healthy Brother    Healthy Brother    Heart disease Brother    Heart disease Brother      Current Outpatient Medications:    acetaminophen (TYLENOL) 500 MG tablet, Take 500-1,000 mg by mouth every 8 (eight) hours as needed (for pain.). , Disp: , Rfl:    EPINEPHrine (PRIMATENE MIST IN), Inhale 2 puffs into the lungs as needed (wheezing or sob)., Disp: , Rfl:    oxyCODONE (OXY IR/ROXICODONE) 5 MG immediate release tablet, Take 1 tablet (5 mg total) by mouth every 4 (four) hours as needed for severe pain., Disp: 60 tablet, Rfl: 0   SYMBICORT 160-4.5 MCG/ACT inhaler, Inhale 2 puffs by mouth twice daily, Disp: 11 g, Rfl: 0 No current facility-administered medications for  this visit.  Facility-Administered Medications Ordered in Other Visits:    sodium chloride flush (NS) 0.9 % injection 10 mL, 10 mL, Intravenous, PRN, Sindy Guadeloupe, MD, 10 mL at 03/09/20 0919  Physical exam:  Vitals:   07/26/21 0914  BP: 120/84  Pulse: 70  Resp: 16  Temp: 97.8 F (36.6 C)  TempSrc: Oral  Weight: 199 lb 6.4 oz (90.4 kg)  Height: 5\' 10"  (1.778 m)   Physical Exam Constitutional:      General: He is not in acute distress. Cardiovascular:     Rate and Rhythm: Normal rate and regular rhythm.     Heart sounds: Normal heart sounds.  Pulmonary:     Effort: Pulmonary effort is normal.     Breath sounds: Normal breath sounds.  Lymphadenopathy:     Comments: Right cervical lymph node is barely palpable  Skin:    General: Skin is warm and dry.  Neurological:     Mental Status: He is alert and oriented to person, place, and time.     CMP Latest Ref Rng &  Units 07/26/2021  Glucose 70 - 99 mg/dL 108(H)  BUN 8 - 23 mg/dL 19  Creatinine 0.61 - 1.24 mg/dL 1.10  Sodium 135 - 145 mmol/L 137  Potassium 3.5 - 5.1 mmol/L 4.1  Chloride 98 - 111 mmol/L 106  CO2 22 - 32 mmol/L 24  Calcium 8.9 - 10.3 mg/dL 8.7(L)  Total Protein 6.5 - 8.1 g/dL 7.1  Total Bilirubin 0.3 - 1.2 mg/dL 0.5  Alkaline Phos 38 - 126 U/L 85  AST 15 - 41 U/L 20  ALT 0 - 44 U/L 12   CBC Latest Ref Rng & Units 07/26/2021  WBC 4.0 - 10.5 K/uL 6.7  Hemoglobin 13.0 - 17.0 g/dL 13.8  Hematocrit 39.0 - 52.0 % 41.4  Platelets 150 - 400 K/uL 241    No images are attached to the encounter.  CT SOFT TISSUE NECK WO CONTRAST  Result Date: 07/16/2021 CLINICAL DATA:  Head neck cancer, assess treatment response. Tonsil cancer with metastases to the lungs. EXAM: CT NECK WITHOUT CONTRAST TECHNIQUE: Multidetector CT imaging of the neck was performed following the standard protocol without intravenous contrast. COMPARISON:  03/02/2021 FINDINGS: Pharynx and larynx: No asymmetry to implicate residual primary.  Tonsilliths on the left. Salivary glands: No inflammation, mass, or stone. Thyroid: Unremarkable Lymph nodes: Enlarged right jugulodigastric node with rounding, up to 31 by 22 mm, unchanged. Adjacent jugular chain nodes are strictly nonenlarged and stable. Vascular: Porta catheter on the right in unremarkable position. Limited intracranial: Negative Visualized orbits: Negative Mastoids and visualized paranasal sinuses: Chronic sinusitis with patchy opacification of paranasal sinuses, especially ethmoids. There may be nasal polyps. Skeleton: C5-6 non segmentation. Generalized cervical spine degeneration. No aggressive or acute finding Upper chest: Negative IMPRESSION: Unchanged enlargement of the right jugulodigastric node. No new or progressive finding. Electronically Signed   By: Jorje Guild M.D.   On: 07/16/2021 09:02   CT CHEST ABDOMEN PELVIS WO CONTRAST  Result Date: 07/15/2021 CLINICAL DATA:  squamous cell carcinoma of palatine tonsil squamous cell carcinoma of palatine tonsil EXAM: CT CHEST, ABDOMEN AND PELVIS WITHOUT CONTRAST TECHNIQUE: Multidetector CT imaging of the chest, abdomen and pelvis was performed following the standard protocol without IV contrast. COMPARISON:  None. FINDINGS: CT CHEST FINDINGS Lines and tubes: Right chest wall Port-A-Cath with tip terminating at the superior cavoatrial junction. Cardiovascular: Normal heart size. No significant pericardial effusion. The thoracic aorta is normal in caliber. Mild atherosclerotic plaque of the thoracic aorta. Left anterior descending coronary artery calcifications. Mediastinum/Nodes: No gross hilar adenopathy, noting limited sensitivity for the detection of hilar adenopathy on this noncontrast study. No enlarged mediastinal or axillary lymph nodes. Thyroid gland, trachea, and esophagus demonstrate no significant findings. Lungs/Pleura: No focal consolidation. Previously noted right pulmonary micronodule not visualized. No new pulmonary  nodule. No pulmonary mass. No pleural effusion. No pneumothorax. Musculoskeletal: No chest wall abnormality. No suspicious lytic or blastic osseous lesions. No acute displaced fracture. Multilevel degenerative changes of the spine. CT ABDOMEN PELVIS FINDINGS Hepatobiliary: No focal liver abnormality. No gallstones, gallbladder wall thickening, or pericholecystic fluid. No biliary dilatation. Pancreas: Diffusely atrophic. No focal lesion. Otherwise normal pancreatic contour. No surrounding inflammatory changes. No main pancreatic ductal dilatation. Spleen: Normal in size without focal abnormality. Adrenals/Urinary Tract: No adrenal nodule bilaterally. No hydronephrosis. There is a 4.5 cm fluid density lesion within the right kidney that likely represents a simple renal cyst. Bilateral nephrolithiasis measuring up to 5 mm on the left and 2 mm on the right. No ureterolithiasis or hydroureter. Portion of the  urinary bladder contained within the left inguinal hernia. Associated urinary bladder wall thickening Stomach/Bowel: PO contrast reaches the transverse colon. Stomach is within normal limits. No evidence of bowel wall thickening or dilatation. Scattered colonic diverticulosis. Appendix appears normal. Vascular/Lymphatic: No abdominal aorta or iliac aneurysm. Mild atherosclerotic plaque of the aorta and its branches. No abdominal, pelvic, or inguinal lymphadenopathy. Reproductive: Uterus and bilateral adnexa are unremarkable. Other: No intraperitoneal free fluid. No intraperitoneal free gas. No organized fluid collection. Musculoskeletal: At least moderate volume left inguinal hernia containing a portion of the urinary bladder. Small to moderate volume fat containing right inguinal hernia. A loop of small bowel is noted at the entrance of the hernia. No suspicious lytic or blastic osseous lesions. No acute displaced fracture. Multilevel degenerative changes of the spine. IMPRESSION: 1. Previously identified right  upper lobe nodule not visualized on this study. No intrathoracic, intra-abdominal, intrapelvic abnormality to suggest metastatic disease. Please note markedly limited evaluation on this noncontrast study. 2. At least moderate volume left inguinal hernia containing a portion of the urinary bladder. Associated irregular-appearing urinary bladder wall thickening. Correlate with physical exam for incarceration. Consider correlation with direct visualization and urinalysis. 3. Small to moderate volume fat containing right inguinal hernia. A loop of small bowel is noted at the entrance of the hernia. No associated bowel obstruction or findings of ischemia. 4. Nonobstructive bilateral nephrolithiasis. 5. Colonic diverticulosis with no acute diverticulitis. 6.  Aortic Atherosclerosis (ICD10-I70.0). 7. Please see separately dictated CT neck 07/15/2021. Electronically Signed   By: Iven Finn M.D.   On: 07/15/2021 23:18     Assessment and plan- Patient is a 70 y.o. 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  He is here for on treatment assessment prior to cycle 26 of palliative Keytruda  Counts okay to proceed with cycle 26 of palliative Keytruda today.  I have reviewed CT soft tissue neck as well as CT chest abdomen pelvis images independently and discussed findings with the patient.  He is stable right jugulodigastric lymph node which is 3 cm but no other evidence of distant metastatic disease.  Previously noted mediastinal adenopathy is also resolved.  My plan is to continue 2 years of maintenance Beryle Flock which will probably end in June 2023.  If there is an increase in the size of this lymph node we could consider biopsy followed by either definitive surgery versus radiation treatment.  He will directly proceed for cycle 27 of Keytruda in 3 weeks and I will see him back in 6 weeks for cycle 28.  Patient has been on as needed  oxycodone for his back pain as well as occasional difficulty swallowing.  I have asked him to cut down his use of oxycodone and I will be refilling his prescription only every 3 months and therefore he will need to wait until his next visit before he can ask for a refill   Visit Diagnosis 1. Encounter for antineoplastic immunotherapy   2. 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 Randa Evens, MD, MPH [facility] [phone] 07/26/2021 12:20 PM

## 2021-07-28 ENCOUNTER — Ambulatory Visit (INDEPENDENT_AMBULATORY_CARE_PROVIDER_SITE_OTHER): Payer: Medicare Other | Admitting: Surgery

## 2021-07-28 ENCOUNTER — Encounter: Payer: Self-pay | Admitting: Surgery

## 2021-07-28 ENCOUNTER — Other Ambulatory Visit: Payer: Self-pay

## 2021-07-28 ENCOUNTER — Ambulatory Visit: Payer: Self-pay | Admitting: Surgery

## 2021-07-28 VITALS — BP 136/78 | HR 97 | Temp 98.2°F | Ht 70.0 in | Wt 201.8 lb

## 2021-07-28 DIAGNOSIS — K4000 Bilateral inguinal hernia, with obstruction, without gangrene, not specified as recurrent: Secondary | ICD-10-CM | POA: Diagnosis not present

## 2021-07-28 DIAGNOSIS — C099 Malignant neoplasm of tonsil, unspecified: Secondary | ICD-10-CM | POA: Diagnosis not present

## 2021-07-28 DIAGNOSIS — K4020 Bilateral inguinal hernia, without obstruction or gangrene, not specified as recurrent: Secondary | ICD-10-CM

## 2021-07-28 NOTE — H&P (View-Only) (Signed)
Patient ID: Patrick Mendoza., male   DOB: [birth year], 70 y.o.   MRN: [id number]  Chief Complaint: Bilateral inguinal hernias  History of Present Illness Patrick Mendoza. is a 70 y.o. 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  Completed cycle 26 of palliative Keytruda.   Plan is to continue 2 years of maintenance Beryle Flock which will probably end in June 2023.  Known bilateral inguinal hernias, left greater than right for some time.  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  CT scan in [place] obtained, I believe he had a partial or complete reduction done which averted the emergency.  He desires to proceed with hernia repair.   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   Tonsil cancer ([place])    Tonsil cancer [facility])       Past Surgical History:  Procedure Laterality Date   CERVICAL SPINE SURGERY     LUMBAR SPINE SURGERY     PORTA CATH INSERTION N/A 10/08/2019   Procedure: PORTA CATH INSERTION;  Surgeon: Katha Cabal, MD;  Location: [place] [facility];  Service: Cardiovascular;  Laterality: N/A;   VIDEO BRONCHOSCOPY WITH ENDOBRONCHIAL NAVIGATION N/A 10/10/2019   Procedure: VIDEO BRONCHOSCOPY WITH ENDOBRONCHIAL NAVIGATION;  Surgeon: Tyler Pita, MD;  Location: [facility];  Service: Pulmonary;  Laterality: N/A;   VIDEO BRONCHOSCOPY WITH ENDOBRONCHIAL ULTRASOUND N/A 10/10/2019   Procedure: VIDEO BRONCHOSCOPY WITH ENDOBRONCHIAL ULTRASOUND;  Surgeon: Tyler Pita, MD;  Location: [facility];  Service: Pulmonary;  Laterality: N/A;    No Known Allergies  Current Outpatient Medications  Medication Sig Dispense Refill   acetaminophen  (TYLENOL) 500 MG tablet Take 500-1,000 mg by mouth every 8 (eight) hours as needed (for pain.).      EPINEPHrine (PRIMATENE MIST IN) Inhale 2 puffs into the lungs as needed (wheezing or sob).     oxyCODONE (OXY IR/ROXICODONE) 5 MG immediate release tablet Take 1 tablet (5 mg total) by mouth every 4 (four) hours as needed for severe pain. 60 tablet 0   SYMBICORT 160-4.5 MCG/ACT inhaler Inhale 2 puffs by mouth twice daily 11 g 0   No current facility-administered medications for this visit.   Facility-Administered Medications Ordered in Other Visits  Medication Dose Route Frequency Provider Last Rate Last Admin   sodium chloride flush (NS) 0.9 % injection 10 mL  10 mL Intravenous PRN Sindy Guadeloupe, MD   10 mL at 03/09/20 0919    Family History Family History  Problem Relation Age of Onset   Cancer Mother 70       not sure what type   Prostate cancer Father    Rheum arthritis Father    Healthy Sister    Healthy Brother    Healthy Sister    Healthy Brother    Healthy Brother    Heart disease Brother    Heart disease Brother       Social History Social History   Tobacco Use   Smoking status: Never   Smokeless tobacco: Never  Vaping Use   Vaping Use: Never used  Substance Use Topics  Alcohol use: Never   Drug use: Never        Review of Systems  Constitutional: Negative.   HENT:  Positive for sore throat (Stage IV tonsillar cancer, previously metastatic to lungs, lingering lymph node present in the neck.).   Eyes: Negative.   Respiratory:  Positive for wheezing (Currently controlled, history of asthma.).   Cardiovascular: Negative.   Gastrointestinal:  Positive for abdominal pain.  Genitourinary: Negative.   Skin: Negative.   Neurological: Negative.   Psychiatric/Behavioral: Negative.       Physical Exam There were no vitals taken for this visit.   CONSTITUTIONAL: Well developed, and nourished, appropriately responsive and aware without distress.   EYES:  Sclera non-icteric.   EARS, NOSE, MOUTH AND THROAT: Mask worn.  Slight fullness noted to right side of neck.  Hearing is intact to voice.  NECK: Trachea is midline, and there is no jugular venous distension.  RESPIRATORY:  Lungs are clear, and breath sounds are equal bilaterally. Normal respiratory effort without pathologic use of accessory muscles. CARDIOVASCULAR: Heart is regular in rate and rhythm. GI: The abdomen is soft, nontender, and nondistended. There were no palpable masses. I did not appreciate hepatosplenomegaly. There were normal bowel sounds. GU: Bilateral inguinal hernias, left larger than right.  Testes descended.  MUSCULOSKELETAL:  Symmetrical muscle tone appreciated in all four extremities.    SKIN: Skin turgor is normal. No pathologic skin lesions appreciated.  NEUROLOGIC:  Motor and sensation appear grossly normal.  Cranial nerves are grossly without defect. PSYCH:  Alert and oriented to person, place and time. Affect is appropriate for situation.  Data Reviewed I have personally reviewed what is currently available of the patient's imaging, recent labs and medical records.   Labs:  CBC Latest Ref Rng & Units 07/26/2021 07/05/2021 06/14/2021  WBC 4.0 - 10.5 K/uL 6.7 6.5 5.9  Hemoglobin 13.0 - 17.0 g/dL 13.8 13.8 13.1  Hematocrit 39.0 - 52.0 % 41.4 40.8 38.4(L)  Platelets 150 - 400 K/uL 241 222 241   CMP Latest Ref Rng & Units 07/26/2021 07/05/2021 06/14/2021  Glucose 70 - 99 mg/dL 108(H) 107(H) 105(H)  BUN 8 - 23 mg/dL 19 19 21   Creatinine 0.61 - 1.24 mg/dL 1.10 0.91 0.91  Sodium 135 - 145 mmol/L 137 138 138  Potassium 3.5 - 5.1 mmol/L 4.1 3.8 3.6  Chloride 98 - 111 mmol/L 106 104 107  CO2 22 - 32 mmol/L 24 24 25   Calcium 8.9 - 10.3 mg/dL 8.7(L) 8.9 8.8(L)  Total Protein 6.5 - 8.1 g/dL 7.1 7.3 7.2  Total Bilirubin 0.3 - 1.2 mg/dL 0.5 0.6 0.6  Alkaline Phos 38 - 126 U/L 85 73 75  AST 15 - 41 U/L 20 19 21   ALT 0 - 44 U/L 12 11 12       Imaging: Radiology review:   Prior CT scans noted. Most recent report noted below: CLINICAL HISTORY: Abdominal tenderness.   COMPARISON:  None.   NUMBER ANMED CT SCANS IN THE PAST 12 MONTHS: 1   TECHNIQUE: Intravenous contrast was administered and thin slice axial images  obtained from the lung base to the pubic symphysis with multiplanar reformatted  imaging obtained.   All CT scans are performed using radiation dose reduction techniques.  Technical  factors are evaluated and adjusted to ensure appropriate moderation of exposure.   Automated dose management technology is applied to adjust the radiation dose to  minimize exposure while achieving a diagnostic quality image.   FINDINGS:  Lung bases are  clear bilaterally. Liver and spleen are normal in size  and homogeneous in density. The gallbladder, stomach, and bilateral adrenal  glands are unremarkable. There is an exophytic low-density lesion present in the  right kidney measuring 5.0 x 3.8 cm   Multiple nonobstructing renal calculi present bilaterally as well. A normal  appendix is present in the right lower quadrant. There are prominent diverticuli  in the sigmoid colon. In the left inguinal canal are multiple loops of small  bowel and a portion of the bladder consistent with a large left inguinal hernia  with mild inflammatory changes in the adjacent fat.   No free fluid. No significant mesenteric or retroperitoneal lymphadenopathy. No  suspicious lytic or sclerotic bony lesions present. On the sagittal reformatted  imaging, there is disc space narrowing present from L3 through S1.   IMPRESSION:     LEFT INGUINAL HERNIA PRESENT CONTAINING MULTIPLE LOOPS OF SMALL BOWEL AND A  PORTION OF THE BLADDER.   DIVERTICULOSIS BUT NO EVIDENCE OF ACUTE DIVERTICULITIS.   NONOBSTRUCTING RENAL CALCULI BILATERALLY AS WELL AS A LOW-DENSITY LESION IN THE  RIGHT KIDNEY THAT IS NOT COMPLETELY CHARACTERIZED.   PROCEDURE RADIATION DOSE: DLP: 807.76, mGy.cm/Effective  Dose: 12.12, mSv   Dictated by: Langley Adie on 07/18/2021  7:12 AM  Transcribed by: Rica Koyanagi on 07/18/2021  7:12 AM  Electronically verified by: Langley Adie on 07/18/2021  9:09 AM  Assessment    Bilateral inguinal hernias. Patient Active Problem List   Diagnosis Date Noted   Severe protein-calorie malnutrition ([place]) 11/14/2019   Asthma 09/23/2019   Goals of care, counseling/discussion 09/23/2019   Primary squamous cell carcinoma of palatine tonsil ([place]) 09/23/2019    Plan    Robotic bilateral inguinal hernia repairs. I discussed possibility of incarceration, strangulation, enlargement in size over time, and the need for emergency surgery in the face of these.  Also reviewed the techniques of reduction should incarceration occur, and when unsuccessful to present to the ED.  Also discussed that surgery risks include recurrence which can be up to 30% in the case of complex hernias, use of prosthetic materials (mesh) and the increased risk of infection and the possible need for re-operation and removal of mesh, possibility of post-op SBO or ileus, and the risks of general anesthetic including heart attack, stroke, sudden death or some reaction to anesthetic medications. The patient, and those present, appear to understand the risks, any and all questions were answered to the patient's satisfaction.  No guarantees were ever expressed or implied.   Face-to-face time spent with the patient and accompanying care providers(if present) was 30 minutes, with more than 50% of the time spent counseling, educating, and coordinating care of the patient.    These notes generated with voice recognition software. I apologize for typographical errors.  Ronny Bacon M.D., FACS 07/28/2021, 1:54 PM

## 2021-07-28 NOTE — Patient Instructions (Signed)
Our surgery scheduler will call you within 24-48 hours to schedule your surgery. Please have the [facility] surgery sheet available when speaking with her.   Inguinal Hernia, Adult An inguinal hernia develops when fat or the intestines push through a weak spot in a muscle where the leg meets the lower abdomen (groin). This creates a bulge. This kind of hernia could also be: In the scrotum, if you are male. In folds of skin around the vagina, if you are male. There are three types of inguinal hernias: Hernias that can be pushed back into the abdomen (are reducible). This type rarely causes pain. Hernias that are not reducible (are incarcerated). Hernias that are not reducible and lose their blood supply (are strangulated). This type of hernia requires emergency surgery. What are the causes? This condition is caused by having a weak spot in the muscles or tissues in your groin. This develops over time. The hernia may poke through the weak spot when you suddenly strain your lower abdominal muscles, such as when you: Lift a heavy object. Strain to have a bowel movement. Constipation can lead to straining. Cough. What increases the risk? This condition is more likely to develop in: Males. Pregnant females. People who: Are overweight. Work in jobs that require long periods of standing or heavy lifting. Have had an inguinal hernia before. Smoke or have lung disease. These factors can lead to long-term (chronic) coughing. What are the signs or symptoms? Symptoms may depend on the size of the hernia. Often, a small inguinal hernia has no symptoms. Symptoms of a larger hernia may include: A bulge in the groin area. This is easier to see when standing. It might not be visible when lying down. Pain or burning in the groin. This may get worse when lifting, straining, or coughing. A dull ache or a feeling of pressure in the groin. An unusual bulge in the scrotum, in males. Symptoms of a strangulated  inguinal hernia may include: A bulge in your groin that is very painful and tender to the touch. A bulge that turns red or purple. Fever, nausea, and vomiting. Inability to have a bowel movement or to pass gas. How is this diagnosed? This condition is diagnosed based on your symptoms, your medical history, and a physical exam. Your health care provider may feel your groin area and ask you to cough. How is this treated? Treatment depends on the size of your hernia and whether you have symptoms. If you do not have symptoms, your health care provider may have you watch your hernia carefully and have you come in for follow-up visits. If your hernia is large or if you have symptoms, you may need surgery to repair the hernia. Follow these instructions at home: Lifestyle Avoid lifting heavy objects. Avoid standing for long periods of time. Do not use any products that contain nicotine or tobacco. These products include cigarettes, chewing tobacco, and vaping devices, such as e-cigarettes. If you need help quitting, ask your health care provider. Maintain a healthy weight. Preventing constipation You may need to take these actions to prevent or treat constipation: Drink enough fluid to keep your urine pale yellow. Take over-the-counter or prescription medicines. Eat foods that are high in fiber, such as beans, whole grains, and fresh fruits and vegetables. Limit foods that are high in fat and processed sugars, such as fried or sweet foods. General instructions You may try to push the hernia back in place by very gently pressing on it while lying down.  Do not try to force the bulge back in if it will not push in easily. Watch your hernia for any changes in shape, size, or color. Get help right away if you notice any changes. Take over-the-counter and prescription medicines only as told by your health care provider. Keep all follow-up visits. This is important. Contact a health care provider if: You  have a fever or chills. You develop new symptoms. Your symptoms get worse. Get help right away if: You have pain in your groin that suddenly gets worse. You have a bulge in your groin that: Suddenly gets bigger and does not get smaller. Becomes red or purple or painful to the touch. You are a man and you have a sudden pain in your scrotum, or the size of your scrotum suddenly changes. You cannot push the hernia back in place by very gently pressing on it when you are lying down. You have nausea or vomiting that does not go away. You have a fast heartbeat. You cannot have a bowel movement or pass gas. These symptoms may represent a serious problem that is an emergency. Do not wait to see if the symptoms will go away. Get medical help right away. Call your local emergency services (911 in the [place] Summary An inguinal hernia develops when fat or the intestines push through a weak spot in a muscle where your leg meets your lower abdomen (groin). This condition is caused by having a weak spot in muscles or tissues in your groin. Symptoms may depend on the size of the hernia, and they may include pain or swelling in your groin. A small inguinal hernia often has no symptoms. Treatment may not be needed if you do not have symptoms. If you have symptoms or a large hernia, you may need surgery to repair the hernia. Avoid lifting heavy objects. Also, avoid standing for long periods of time. This information is not intended to replace advice given to you by your health care provider. Make sure you discuss any questions you have with your health care provider. Document Revised: 04/13/2020 Document Reviewed: 04/13/2020 Elsevier Patient Education  2022 [employer].

## 2021-07-28 NOTE — Progress Notes (Signed)
Patient ID: Patrick Bering., male   DOB: [birth year], 70 y.o.   MRN: [id number]  Chief Complaint: Bilateral inguinal hernias  History of Present Illness Patrick Dunker. is a 70 y.o. 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  Completed cycle 26 of palliative Keytruda.   Plan is to continue 2 years of maintenance Beryle Flock which will probably end in June 2023.  Known bilateral inguinal hernias, left greater than right for some time.  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  CT scan in [place] obtained, I believe he had a partial or complete reduction done which averted the emergency.  He desires to proceed with hernia repair.   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   Tonsil cancer ([place])    Tonsil cancer [facility])       Past Surgical History:  Procedure Laterality Date   CERVICAL SPINE SURGERY     LUMBAR SPINE SURGERY     PORTA CATH INSERTION N/A 10/08/2019   Procedure: PORTA CATH INSERTION;  Surgeon: Katha Cabal, MD;  Location: [facility];  Service: Cardiovascular;  Laterality: N/A;   VIDEO BRONCHOSCOPY WITH ENDOBRONCHIAL NAVIGATION N/A 10/10/2019   Procedure: VIDEO BRONCHOSCOPY WITH ENDOBRONCHIAL NAVIGATION;  Surgeon: Tyler Pita, MD;  Location: [facility];  Service: Pulmonary;  Laterality: N/A;   VIDEO BRONCHOSCOPY WITH ENDOBRONCHIAL ULTRASOUND N/A 10/10/2019   Procedure: VIDEO BRONCHOSCOPY WITH ENDOBRONCHIAL ULTRASOUND;  Surgeon: Tyler Pita, MD;  Location: [facility];  Service: Pulmonary;  Laterality: N/A;    No Known Allergies  Current Outpatient Medications  Medication Sig Dispense Refill   acetaminophen  (TYLENOL) 500 MG tablet Take 500-1,000 mg by mouth every 8 (eight) hours as needed (for pain.).      EPINEPHrine (PRIMATENE MIST IN) Inhale 2 puffs into the lungs as needed (wheezing or sob).     oxyCODONE (OXY IR/ROXICODONE) 5 MG immediate release tablet Take 1 tablet (5 mg total) by mouth every 4 (four) hours as needed for severe pain. 60 tablet 0   SYMBICORT 160-4.5 MCG/ACT inhaler Inhale 2 puffs by mouth twice daily 11 g 0   No current facility-administered medications for this visit.   Facility-Administered Medications Ordered in Other Visits  Medication Dose Route Frequency Provider Last Rate Last Admin   sodium chloride flush (NS) 0.9 % injection 10 mL  10 mL Intravenous PRN Sindy Guadeloupe, MD   10 mL at 03/09/20 0919    Family History Family History  Problem Relation Age of Onset   Cancer Mother 77       not sure what type   Prostate cancer Father    Rheum arthritis Father    Healthy Sister    Healthy Brother    Healthy Sister    Healthy Brother    Healthy Brother    Heart disease Brother    Heart disease Brother       Social History Social History   Tobacco Use   Smoking status: Never   Smokeless tobacco: Never  Vaping Use   Vaping Use: Never used  Substance Use Topics  Alcohol use: Never   Drug use: Never        Review of Systems  Constitutional: Negative.   HENT:  Positive for sore throat (Stage IV tonsillar cancer, previously metastatic to lungs, lingering lymph node present in the neck.).   Eyes: Negative.   Respiratory:  Positive for wheezing (Currently controlled, history of asthma.).   Cardiovascular: Negative.   Gastrointestinal:  Positive for abdominal pain.  Genitourinary: Negative.   Skin: Negative.   Neurological: Negative.   Psychiatric/Behavioral: Negative.       Physical Exam There were no vitals taken for this visit.   CONSTITUTIONAL: Well developed, and nourished, appropriately responsive and aware without distress.   EYES:  Sclera non-icteric.   EARS, NOSE, MOUTH AND THROAT: Mask worn.  Slight fullness noted to right side of neck.  Hearing is intact to voice.  NECK: Trachea is midline, and there is no jugular venous distension.  RESPIRATORY:  Lungs are clear, and breath sounds are equal bilaterally. Normal respiratory effort without pathologic use of accessory muscles. CARDIOVASCULAR: Heart is regular in rate and rhythm. GI: The abdomen is soft, nontender, and nondistended. There were no palpable masses. I did not appreciate hepatosplenomegaly. There were normal bowel sounds. GU: Bilateral inguinal hernias, left larger than right.  Testes descended.  MUSCULOSKELETAL:  Symmetrical muscle tone appreciated in all four extremities.    SKIN: Skin turgor is normal. No pathologic skin lesions appreciated.  NEUROLOGIC:  Motor and sensation appear grossly normal.  Cranial nerves are grossly without defect. PSYCH:  Alert and oriented to person, place and time. Affect is appropriate for situation.  Data Reviewed I have personally reviewed what is currently available of the patient's imaging, recent labs and medical records.   Labs:  CBC Latest Ref Rng & Units 07/26/2021 07/05/2021 06/14/2021  WBC 4.0 - 10.5 K/uL 6.7 6.5 5.9  Hemoglobin 13.0 - 17.0 g/dL 13.8 13.8 13.1  Hematocrit 39.0 - 52.0 % 41.4 40.8 38.4(L)  Platelets 150 - 400 K/uL 241 222 241   CMP Latest Ref Rng & Units 07/26/2021 07/05/2021 06/14/2021  Glucose 70 - 99 mg/dL 108(H) 107(H) 105(H)  BUN 8 - 23 mg/dL 19 19 21   Creatinine 0.61 - 1.24 mg/dL 1.10 0.91 0.91  Sodium 135 - 145 mmol/L 137 138 138  Potassium 3.5 - 5.1 mmol/L 4.1 3.8 3.6  Chloride 98 - 111 mmol/L 106 104 107  CO2 22 - 32 mmol/L 24 24 25   Calcium 8.9 - 10.3 mg/dL 8.7(L) 8.9 8.8(L)  Total Protein 6.5 - 8.1 g/dL 7.1 7.3 7.2  Total Bilirubin 0.3 - 1.2 mg/dL 0.5 0.6 0.6  Alkaline Phos 38 - 126 U/L 85 73 75  AST 15 - 41 U/L 20 19 21   ALT 0 - 44 U/L 12 11 12       Imaging: Radiology review:   Prior CT scans noted. Most recent report noted below: CLINICAL HISTORY: Abdominal tenderness.   COMPARISON:  None.   NUMBER ANMED CT SCANS IN THE PAST 12 MONTHS: 1   TECHNIQUE: Intravenous contrast was administered and thin slice axial images  obtained from the lung base to the pubic symphysis with multiplanar reformatted  imaging obtained.   All CT scans are performed using radiation dose reduction techniques.  Technical  factors are evaluated and adjusted to ensure appropriate moderation of exposure.   Automated dose management technology is applied to adjust the radiation dose to  minimize exposure while achieving a diagnostic quality image.   FINDINGS:  Lung bases are  clear bilaterally. Liver and spleen are normal in size  and homogeneous in density. The gallbladder, stomach, and bilateral adrenal  glands are unremarkable. There is an exophytic low-density lesion present in the  right kidney measuring 5.0 x 3.8 cm   Multiple nonobstructing renal calculi present bilaterally as well. A normal  appendix is present in the right lower quadrant. There are prominent diverticuli  in the sigmoid colon. In the left inguinal canal are multiple loops of small  bowel and a portion of the bladder consistent with a large left inguinal hernia  with mild inflammatory changes in the adjacent fat.   No free fluid. No significant mesenteric or retroperitoneal lymphadenopathy. No  suspicious lytic or sclerotic bony lesions present. On the sagittal reformatted  imaging, there is disc space narrowing present from L3 through S1.   IMPRESSION:     LEFT INGUINAL HERNIA PRESENT CONTAINING MULTIPLE LOOPS OF SMALL BOWEL AND A  PORTION OF THE BLADDER.   DIVERTICULOSIS BUT NO EVIDENCE OF ACUTE DIVERTICULITIS.   NONOBSTRUCTING RENAL CALCULI BILATERALLY AS WELL AS A LOW-DENSITY LESION IN THE  RIGHT KIDNEY THAT IS NOT COMPLETELY CHARACTERIZED.   PROCEDURE RADIATION DOSE: DLP: 807.76, mGy.cm/Effective  Dose: 12.12, mSv   Dictated by: Langley Adie on 07/18/2021  7:12 AM  Transcribed by: Rica Koyanagi on 07/18/2021  7:12 AM  Electronically verified by: Langley Adie on 07/18/2021  9:09 AM  Assessment    Bilateral inguinal hernias. 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 I discussed possibility of incarceration, strangulation, enlargement in size over time, and the need for emergency surgery in the face of these.  Also reviewed the techniques of reduction should incarceration occur, and when unsuccessful to present to the ED.  Also discussed that surgery risks include recurrence which can be up to 30% in the case of complex hernias, use of prosthetic materials (mesh) and the increased risk of infection and the possible need for re-operation and removal of mesh, possibility of post-op SBO or ileus, and the risks of general anesthetic including heart attack, stroke, sudden death or some reaction to anesthetic medications. The patient, and those present, appear to understand the risks, any and all questions were answered to the patient's satisfaction.  No guarantees were ever expressed or implied.   Face-to-face time spent with the patient and accompanying care providers(if present) was 30 minutes, with more than 50% of the time spent counseling, educating, and coordinating care of the patient.    These notes generated with voice recognition software. I apologize for typographical errors.  Patrick Mendoza M.D., FACS 07/28/2021, 1:54 PM

## 2021-07-29 ENCOUNTER — Telehealth: Payer: Self-pay | Admitting: Surgery

## 2021-07-29 NOTE — Telephone Encounter (Signed)
Patient has been advised of Pre-Admission date/time, COVID Testing date and Surgery date.  Surgery Date: 08/05/21 Preadmission Testing Date: 08/02/21 (phone 8a-1p) Covid Testing Date: Not needed.     Patient has been made aware to call [phone], between 1-3:00pm the day before surgery, to find out what time to arrive for surgery.

## 2021-08-02 ENCOUNTER — Other Ambulatory Visit: Payer: Self-pay

## 2021-08-02 ENCOUNTER — Encounter
Admission: RE | Admit: 2021-08-02 | Discharge: 2021-08-02 | Disposition: A | Discharge status: Home / Self Care | Payer: Medicare Other | Source: Ambulatory Visit | Attending: Surgery | Admitting: Surgery

## 2021-08-02 HISTORY — DX: Personal history of other diseases of the digestive system: Z87.19

## 2021-08-02 NOTE — Patient Instructions (Addendum)
Your procedure is scheduled on: Friday 08/05/21 Report to the Registration Desk on the 1st floor of the [facility]. To find out your arrival time, please call [phone] between 1PM - 3PM on: Thursday 08/04/21  REMEMBER: Instructions that are not followed completely may result in serious medical risk, up to and including death; or upon the discretion of your surgeon and anesthesiologist your surgery may need to be rescheduled.  Do not eat or drink after midnight the night before surgery.  No gum chewing, lozengers or hard candies.  TAKE THESE MEDICATIONS THE MORNING OF SURGERY WITH A SIP OF WATER: NONE   Use your budesonide-formoterol (SYMBICORT) 160-4.5 MCG/ACT inhaler and EPINEPHrine (PRIMATENE MIST) 0.125 MG/ACT AERO inhalers on the day of surgery and bring to the hospital.  One week prior to surgery: Stop Anti-inflammatories (NSAIDS) such as Advil, Aleve, Ibuprofen, Motrin, Naproxen, Naprosyn and Aspirin based products such as Excedrin, Goodys Powder, BC Powder. Stop ANY OVER THE COUNTER supplements until after surgery. You may however, continue to take Tylenol if needed for pain up until the day of surgery.  No Alcohol for 24 hours before or after surgery.  No Smoking including e-cigarettes for 24 hours prior to surgery.  No chewable tobacco products for at least 6 hours prior to surgery.  No nicotine patches on the day of surgery.  Do not use any "recreational" drugs for at least a week prior to your surgery.  Please be advised that the combination of cocaine and anesthesia may have negative outcomes, up to and including death. If you test positive for cocaine, your surgery will be cancelled.  On the morning of surgery brush your teeth with toothpaste and water, you may rinse your mouth with mouthwash if you wish. Do not swallow any toothpaste or mouthwash.  Use Antibacterial soap the night before surgery then put on clean pajamas and a clean sheet. Repeat the  Antibacterial soap shower the morning of surgery.  Do not wear jewelry.  Do not wear lotions, powders, or colognes.   Do not shave body from the neck down 48 hours prior to surgery just in case you cut yourself which could leave a site for infection.  Also, freshly shaved skin may become irritated if using the CHG soap.  Contact lenses, hearing aids and dentures may not be worn into surgery.  Do not bring valuables to the hospital. [facility] is not responsible for any missing/lost belongings or valuables.   Notify your doctor if there is any change in your medical condition (cold, fever, infection).  Wear comfortable clothing (specific to your surgery type) to the hospital.  After surgery, you can help prevent lung complications by doing breathing exercises.  Take deep breaths and cough every 1-2 hours.   When coughing or sneezing, hold a pillow firmly against your incision with both hands. This is called "splinting." Doing this helps protect your incision. It also decreases belly discomfort.  If you are being discharged the day of surgery, you will not be allowed to drive home. You will need a responsible adult (18 years or older) to drive you home and stay with you that night.   If you are taking public transportation, you will need to have a responsible adult (18 years or older) with you. Please confirm with your physician that it is acceptable to use public transportation.   Please call the [facility] Dept. at [phone] if you have any questions about these instructions.  Surgery Visitation Policy:  Patients  undergoing a surgery or procedure may have one family member or support person with them as long as that person is not COVID-19 positive or experiencing its symptoms.  That person may remain in the waiting area during the procedure and may rotate out with other people.  Inpatient Visitation:    Visiting hours are 7 a.m. to 8 p.m. Up to two visitors  ages 16+ are allowed at one time in a patient room. The visitors may rotate out with other people during the day. Visitors must check out when they leave, or other visitors will not be allowed. One designated support person may remain overnight. The visitor must pass COVID-19 screenings, use hand sanitizer when entering and exiting the patient's room and wear a mask at all times, including in the patient's room. Patients must also wear a mask when staff or their visitor are in the room. Masking is required regardless of vaccination status.

## 2021-08-04 MED ORDER — CEFAZOLIN SODIUM-DEXTROSE 2-4 GM/100ML-% IV SOLN
2.0000 g | INTRAVENOUS | Status: AC
  Administered 2021-08-05: 2 g via INTRAVENOUS

## 2021-08-04 MED ORDER — CELECOXIB 200 MG PO CAPS: 200.0000 mg | ORAL_CAPSULE | ORAL | Status: AC

## 2021-08-04 MED ORDER — ACETAMINOPHEN 500 MG PO TABS: 1000.0000 mg | ORAL_TABLET | ORAL | Status: AC

## 2021-08-04 MED ORDER — ORAL CARE MOUTH RINSE: 15.0000 mL | Freq: Once | OROMUCOSAL | Status: AC

## 2021-08-04 MED ORDER — BUPIVACAINE LIPOSOME 1.3 % IJ SUSP: 20.0000 mL | Freq: Once | INTRAMUSCULAR | Status: DC

## 2021-08-04 MED ORDER — LACTATED RINGERS IV SOLN: INTRAVENOUS | Status: DC

## 2021-08-04 MED ORDER — CHLORHEXIDINE GLUCONATE 0.12 % MT SOLN: 15.0000 mL | Freq: Once | OROMUCOSAL | Status: AC

## 2021-08-04 MED ORDER — CHLORHEXIDINE GLUCONATE CLOTH 2 % EX PADS
6.0000 | MEDICATED_PAD | Freq: Once | CUTANEOUS | Status: AC
  Administered 2021-08-05: 6 via TOPICAL

## 2021-08-04 MED ORDER — CHLORHEXIDINE GLUCONATE CLOTH 2 % EX PADS: 6.0000 | MEDICATED_PAD | Freq: Once | CUTANEOUS | Status: DC

## 2021-08-04 MED ORDER — GABAPENTIN 300 MG PO CAPS: 300.0000 mg | ORAL_CAPSULE | ORAL | Status: AC

## 2021-08-04 MED ORDER — FAMOTIDINE 20 MG PO TABS: 20.0000 mg | ORAL_TABLET | Freq: Once | ORAL | Status: AC

## 2021-08-05 ENCOUNTER — Encounter: Payer: Self-pay | Admitting: Surgery

## 2021-08-05 ENCOUNTER — Ambulatory Visit
Admission: RE | Admit: 2021-08-05 | Discharge: 2021-08-05 | Disposition: A | Discharge status: Home / Self Care | Payer: Medicare Other | Attending: Surgery | Admitting: Surgery

## 2021-08-05 ENCOUNTER — Encounter
Admission: RE | Disposition: A | Discharge status: Home / Self Care | Payer: Self-pay | Source: Home / Self Care | Attending: Surgery

## 2021-08-05 ENCOUNTER — Ambulatory Visit: Payer: Medicare Other | Admitting: Anesthesiology

## 2021-08-05 ENCOUNTER — Other Ambulatory Visit: Payer: Self-pay

## 2021-08-05 DIAGNOSIS — K4020 Bilateral inguinal hernia, without obstruction or gangrene, not specified as recurrent: Secondary | ICD-10-CM | POA: Diagnosis present

## 2021-08-05 DIAGNOSIS — C779 Secondary and unspecified malignant neoplasm of lymph node, unspecified: Secondary | ICD-10-CM | POA: Insufficient documentation

## 2021-08-05 DIAGNOSIS — C099 Malignant neoplasm of tonsil, unspecified: Secondary | ICD-10-CM | POA: Insufficient documentation

## 2021-08-05 DIAGNOSIS — C7800 Secondary malignant neoplasm of unspecified lung: Secondary | ICD-10-CM | POA: Insufficient documentation

## 2021-08-05 SURGERY — REPAIR, HERNIA, INGUINAL, BILATERAL, ROBOT-ASSISTED
Anesthesia: General | Laterality: Bilateral

## 2021-08-05 MED ORDER — SUGAMMADEX SODIUM 200 MG/2ML IV SOLN
INTRAVENOUS | Status: DC | PRN
  Administered 2021-08-05 (×2): 100 mg via INTRAVENOUS

## 2021-08-05 MED ORDER — KETOROLAC TROMETHAMINE 15 MG/ML IJ SOLN
15.0000 mg | Freq: Once | INTRAMUSCULAR | Status: AC
  Administered 2021-08-05: 15 mg via INTRAVENOUS

## 2021-08-05 MED ORDER — DEXAMETHASONE SODIUM PHOSPHATE 10 MG/ML IJ SOLN
INTRAMUSCULAR | Status: AC
  Filled 2021-08-05: qty 1

## 2021-08-05 MED ORDER — CEFAZOLIN SODIUM-DEXTROSE 2-4 GM/100ML-% IV SOLN
INTRAVENOUS | Status: AC
  Filled 2021-08-05: qty 100

## 2021-08-05 MED ORDER — ONDANSETRON HCL 4 MG/2ML IJ SOLN
INTRAMUSCULAR | Status: DC | PRN
  Administered 2021-08-05: 4 mg via INTRAVENOUS

## 2021-08-05 MED ORDER — PHENYLEPHRINE HCL (PRESSORS) 10 MG/ML IV SOLN
INTRAVENOUS | Status: AC
  Filled 2021-08-05: qty 1

## 2021-08-05 MED ORDER — MIDAZOLAM HCL 2 MG/2ML IJ SOLN
INTRAMUSCULAR | Status: AC
  Filled 2021-08-05: qty 2

## 2021-08-05 MED ORDER — IBUPROFEN 800 MG PO TABS: 800.0000 mg | ORAL_TABLET | Freq: Three times a day (TID) | ORAL | 0 refills | Status: DC | PRN

## 2021-08-05 MED ORDER — KETOROLAC TROMETHAMINE 15 MG/ML IJ SOLN
INTRAMUSCULAR | Status: AC
  Filled 2021-08-05: qty 1

## 2021-08-05 MED ORDER — ROCURONIUM BROMIDE 100 MG/10ML IV SOLN
INTRAVENOUS | Status: DC | PRN
  Administered 2021-08-05: 50 mg via INTRAVENOUS
  Administered 2021-08-05: 30 mg via INTRAVENOUS

## 2021-08-05 MED ORDER — FENTANYL CITRATE (PF) 100 MCG/2ML IJ SOLN
INTRAMUSCULAR | Status: AC
  Filled 2021-08-05: qty 2

## 2021-08-05 MED ORDER — PROPOFOL 10 MG/ML IV BOLUS
INTRAVENOUS | Status: AC
  Filled 2021-08-05: qty 20

## 2021-08-05 MED ORDER — ONDANSETRON HCL 4 MG/2ML IJ SOLN
INTRAMUSCULAR | Status: AC
  Filled 2021-08-05: qty 2

## 2021-08-05 MED ORDER — BUPIVACAINE LIPOSOME 1.3 % IJ SUSP
INTRAMUSCULAR | Status: AC
  Filled 2021-08-05: qty 20

## 2021-08-05 MED ORDER — PROPOFOL 10 MG/ML IV BOLUS
INTRAVENOUS | Status: DC | PRN
  Administered 2021-08-05: 150 mg via INTRAVENOUS

## 2021-08-05 MED ORDER — BUPIVACAINE-EPINEPHRINE (PF) 0.25% -1:200000 IJ SOLN
INTRAMUSCULAR | Status: AC
  Filled 2021-08-05: qty 30

## 2021-08-05 MED ORDER — BUPIVACAINE-EPINEPHRINE (PF) 0.25% -1:200000 IJ SOLN
INTRAMUSCULAR | Status: DC | PRN
  Administered 2021-08-05: 50 mL via SURGICAL_CAVITY

## 2021-08-05 MED ORDER — OXYCODONE HCL 5 MG PO TABS
ORAL_TABLET | ORAL | Status: AC
  Administered 2021-08-05: 5 mg via ORAL
  Filled 2021-08-05: qty 1

## 2021-08-05 MED ORDER — FENTANYL CITRATE (PF) 100 MCG/2ML IJ SOLN
INTRAMUSCULAR | Status: DC | PRN
  Administered 2021-08-05 (×2): 50 ug via INTRAVENOUS

## 2021-08-05 MED ORDER — LIDOCAINE HCL (PF) 2 % IJ SOLN
INTRAMUSCULAR | Status: AC
  Filled 2021-08-05: qty 5

## 2021-08-05 MED ORDER — OXYCODONE HCL 5 MG PO TABS: 5.0000 mg | ORAL_TABLET | Freq: Once | ORAL | Status: AC

## 2021-08-05 MED ORDER — CHLORHEXIDINE GLUCONATE 0.12 % MT SOLN
OROMUCOSAL | Status: AC
  Administered 2021-08-05: 15 mL via OROMUCOSAL
  Filled 2021-08-05: qty 15

## 2021-08-05 MED ORDER — ACETAMINOPHEN 500 MG PO TABS
ORAL_TABLET | ORAL | Status: AC
  Administered 2021-08-05: 1000 mg via ORAL
  Filled 2021-08-05: qty 1

## 2021-08-05 MED ORDER — LIDOCAINE HCL (CARDIAC) PF 100 MG/5ML IV SOSY
PREFILLED_SYRINGE | INTRAVENOUS | Status: DC | PRN
  Administered 2021-08-05: 75 mg via INTRAVENOUS

## 2021-08-05 MED ORDER — ONDANSETRON HCL 4 MG/2ML IJ SOLN: 4.0000 mg | Freq: Once | INTRAMUSCULAR | Status: DC | PRN

## 2021-08-05 MED ORDER — PHENYLEPHRINE HCL-NACL 20-0.9 MG/250ML-% IV SOLN
INTRAVENOUS | Status: AC
  Filled 2021-08-05: qty 250

## 2021-08-05 MED ORDER — PHENYLEPHRINE HCL (PRESSORS) 10 MG/ML IV SOLN
INTRAVENOUS | Status: DC | PRN
Start: 2021-08-05 — End: 2021-08-05
  Administered 2021-08-05: 160 ug via INTRAVENOUS

## 2021-08-05 MED ORDER — OXYCODONE HCL 5 MG PO TABS: 5.0000 mg | ORAL_TABLET | Freq: Four times a day (QID) | ORAL | 0 refills | Status: DC | PRN

## 2021-08-05 MED ORDER — GABAPENTIN 300 MG PO CAPS
ORAL_CAPSULE | ORAL | Status: AC
  Administered 2021-08-05: 300 mg via ORAL
  Filled 2021-08-05: qty 1

## 2021-08-05 MED ORDER — ROCURONIUM BROMIDE 10 MG/ML (PF) SYRINGE
PREFILLED_SYRINGE | INTRAVENOUS | Status: AC
  Filled 2021-08-05: qty 10

## 2021-08-05 MED ORDER — FENTANYL CITRATE (PF) 100 MCG/2ML IJ SOLN
INTRAMUSCULAR | Status: AC
  Administered 2021-08-05: 50 ug via INTRAVENOUS
  Filled 2021-08-05: qty 2

## 2021-08-05 MED ORDER — EPHEDRINE SULFATE 50 MG/ML IJ SOLN
INTRAMUSCULAR | Status: DC | PRN
  Administered 2021-08-05: 5 mg via INTRAVENOUS

## 2021-08-05 MED ORDER — CELECOXIB 200 MG PO CAPS
ORAL_CAPSULE | ORAL | Status: AC
  Administered 2021-08-05: 200 mg via ORAL
  Filled 2021-08-05: qty 1

## 2021-08-05 MED ORDER — FAMOTIDINE 20 MG PO TABS
ORAL_TABLET | ORAL | Status: AC
  Administered 2021-08-05: 20 mg via ORAL
  Filled 2021-08-05: qty 1

## 2021-08-05 MED ORDER — FENTANYL CITRATE (PF) 100 MCG/2ML IJ SOLN
25.0000 ug | INTRAMUSCULAR | Status: DC | PRN
  Administered 2021-08-05 (×2): 50 ug via INTRAVENOUS

## 2021-08-05 MED ORDER — 0.9 % SODIUM CHLORIDE (POUR BTL) OPTIME
TOPICAL | Status: DC | PRN
  Administered 2021-08-05: 50 mL

## 2021-08-05 SURGICAL SUPPLY — 49 items
ADH SKN CLS APL DERMABOND .7 (GAUZE/BANDAGES/DRESSINGS) ×1
APL PRP STRL LF DISP 70% ISPRP (MISCELLANEOUS) ×1
BLADE CLIPPER SURG (BLADE) ×2 IMPLANT
CANNULA CAP OBTURATR AIRSEAL 8 (CAP) ×2 IMPLANT
CHLORAPREP W/TINT 26 (MISCELLANEOUS) ×2 IMPLANT
COVER TIP SHEARS 8 DVNC (MISCELLANEOUS) ×1 IMPLANT
COVER TIP SHEARS 8MM DA VINCI (MISCELLANEOUS) ×2
COVER WAND RF STERILE (DRAPES) ×2 IMPLANT
DEFOGGER SCOPE WARMER CLEARIFY (MISCELLANEOUS) ×2 IMPLANT
DERMABOND ADVANCED (GAUZE/BANDAGES/DRESSINGS) ×1
DERMABOND ADVANCED .7 DNX12 (GAUZE/BANDAGES/DRESSINGS) ×1 IMPLANT
DRAPE 3/4 80X56 (DRAPES) ×2 IMPLANT
DRAPE ARM DVNC X/XI (DISPOSABLE) ×3 IMPLANT
DRAPE COLUMN DVNC XI (DISPOSABLE) ×1 IMPLANT
DRAPE DA VINCI XI ARM (DISPOSABLE) ×6
DRAPE DA VINCI XI COLUMN (DISPOSABLE) ×2
ELECT REM PT RETURN 9FT ADLT (ELECTROSURGICAL) ×2
ELECTRODE REM PT RTRN 9FT ADLT (ELECTROSURGICAL) ×1 IMPLANT
GAUZE 4X4 16PLY ~~LOC~~+RFID DBL (SPONGE) ×2 IMPLANT
GLOVE SURG ORTHO LTX SZ7.5 (GLOVE) ×6 IMPLANT
GOWN STRL REUS W/ TWL LRG LVL3 (GOWN DISPOSABLE) ×3 IMPLANT
GOWN STRL REUS W/TWL LRG LVL3 (GOWN DISPOSABLE) ×6
GRASPER SUT TROCAR 14GX15 (MISCELLANEOUS) ×2 IMPLANT
IRRIGATION STRYKERFLOW (MISCELLANEOUS) IMPLANT
IRRIGATOR STRYKERFLOW (MISCELLANEOUS)
IV CATH ANGIO 14GX1.88 NO SAFE (IV SOLUTION) ×2 IMPLANT
IV NS 1000ML (IV SOLUTION)
IV NS 1000ML BAXH (IV SOLUTION) IMPLANT
KIT PINK PAD W/HEAD ARE REST (MISCELLANEOUS) ×2
KIT PINK PAD W/HEAD ARM REST (MISCELLANEOUS) ×1 IMPLANT
LABEL OR SOLS (LABEL) ×2 IMPLANT
MANIFOLD NEPTUNE II (INSTRUMENTS) ×2 IMPLANT
MESH 3DMAX LIGHT 4.1X6.2 LT LR (Mesh General) ×2 IMPLANT
MESH 3DMAX LIGHT 4.1X6.2 RT LR (Mesh General) ×2 IMPLANT
NEEDLE HYPO 22GX1.5 SAFETY (NEEDLE) ×2 IMPLANT
NEEDLE INSUFFLATION 14GA 120MM (NEEDLE) ×2 IMPLANT
PACK LAP CHOLECYSTECTOMY (MISCELLANEOUS) ×2 IMPLANT
SEAL CANN UNIV 5-8 DVNC XI (MISCELLANEOUS) ×2 IMPLANT
SEAL XI 5MM-8MM UNIVERSAL (MISCELLANEOUS) ×4
SET TUBE FILTERED XL AIRSEAL (SET/KITS/TRAYS/PACK) ×2 IMPLANT
SOLUTION ELECTROLUBE (MISCELLANEOUS) ×2 IMPLANT
SUT MNCRL 4-0 (SUTURE) ×2
SUT MNCRL 4-0 27XMFL (SUTURE) ×1
SUT VIC AB 0 CT2 27 (SUTURE) ×4 IMPLANT
SUT VLOC 90 2/L VL 12 GS22 (SUTURE) ×2 IMPLANT
SUT VLOC 90 S/L VL9 GS22 (SUTURE) ×2 IMPLANT
SUTURE MNCRL 4-0 27XMF (SUTURE) ×1 IMPLANT
TROCAR Z-THREAD FIOS 11X100 BL (TROCAR) IMPLANT
WATER STERILE IRR 500ML POUR (IV SOLUTION) ×2 IMPLANT

## 2021-08-05 NOTE — Anesthesia Procedure Notes (Signed)
Procedure Name: Intubation Date/Time: 08/05/2021 1:05 PM Performed by: Zetta Bills, CRNA Pre-anesthesia Checklist: Patient identified, Emergency Drugs available, Suction available, Patient being monitored and Timeout performed Patient Re-evaluated:Patient Re-evaluated prior to induction Oxygen Delivery Method: Circle system utilized Preoxygenation: Pre-oxygenation with 100% oxygen Induction Type: IV induction Ventilation: Mask ventilation without difficulty Laryngoscope Size: McGraph and 4 Grade View: Grade I Tube type: Oral Tube size: 7.0 mm Number of attempts: 1 Airway Equipment and Method: Stylet Placement Confirmation: ETT inserted through vocal cords under direct vision Secured at: 22 cm Tube secured with: Tape Dental Injury: Teeth and Oropharynx as per pre-operative assessment

## 2021-08-05 NOTE — Transfer of Care (Signed)
Immediate Anesthesia Transfer of Care Note  Patient: Patrick Mendoza.  Procedure(s) Performed: XI ROBOTIC ASSISTED INGUINAL HERNIA REPAIR WITH MESH (Bilateral)  Patient Location: PACU  Anesthesia Type:General  Level of Consciousness: awake  Airway & Oxygen Therapy: Patient Spontanous Breathing  Post-op Assessment: Report given to RN  Post vital signs: stable  Last Vitals:  Vitals Value Taken Time  BP 129/75 08/05/21 1500  Temp    Pulse 69 08/05/21 1503  Resp 11 08/05/21 1503  SpO2 98 % 08/05/21 1503  Vitals shown include unvalidated device data.  Last Pain:  Vitals:   08/05/21 1156  TempSrc: Temporal  PainSc: 4          Complications: No notable events documented.

## 2021-08-05 NOTE — Op Note (Signed)
Robotic assisted Laparoscopic Transabdominal bilateral inguinal Hernia Repair with Mesh       Pre-operative Diagnosis: Bilateral inguinal Hernias   Post-operative Diagnosis: Same   Procedure: Robotic assisted Laparoscopic  repair of bilateral inguinal hernia(s).    Surgeon: Ronny Bacon, M.D., FACS   Anesthesia: GETA   Findings: Bilateral direct inguinal hernias.  Partial exposure of polypropylene aspect of prior umbilical hernia repair with Composix mesh (PTFE with polypropylene).               Procedure Details  The patient was seen again in the Holding Room. The benefits, complications, treatment options, and expected outcomes were discussed with the patient. The risks of bleeding, infection, recurrence of symptoms, failure to resolve symptoms, recurrence of hernia, ischemic orchitis, chronic pain syndrome or neuroma, were reviewed again. The likelihood of improving the patient's symptoms with return to their baseline status is good.  The patient and/or family concurred with the proposed plan, giving informed consent.  The patient was taken to Operating Room, identified  and the procedure verified as Laparoscopic Inguinal Hernia Repair. Laterality confirmed.  A Time Out was held and the above information confirmed.   Prior to the induction of general anesthesia, antibiotic prophylaxis was administered. VTE prophylaxis was in place. General endotracheal anesthesia was then administered and tolerated well. After the induction, the abdomen was prepped with Chloraprep and draped in the sterile fashion. The patient was positioned in the supine position.   After local infiltration of quarter percent Marcaine with epinephrine, stab incision was made left upper quadrant.  On the left at Palmer's point, the Veress needle is passed with sensation of the layers to penetrate the abdominal wall and into the peritoneum.  Saline drop test is confirmed peritoneal placement.  Insufflation is  initiated with carbon dioxide to pressures of 15 mmHg. An 8.5 mm port is placed to the left off of the midline, with blunt tipped trocar.  Pneumoperitoneum maintained w/o HD changes using the AirSeal to pressures of 15 mm Hg with CO2. No evidence of bowel injuries.  Two 8.5 mm ports placed under direct vision in each upper quadrant. The laparoscopy revealed omental adhesions to the anterior abdominal wall at the umbilicus, and the bilateral indirect defect(s).   The robot was brought ot the table and docked in the standard fashion, no collision between arms was observed. Instruments were kept under direct view at all times. I lysed the omental adhesions to the anterior abdominal wall and revealed the Composix mesh, the polypropylene aspect was nonadherent to the anterior abdominal wall on the cephalad aspect of the repair, the lower half of the mesh appeared to be well adherent, see photo above. For bilateral inguinal hernia repair,  I developed a peritoneal flap. The sac(s) were reduced and dissected free from adjacent structures. We preserved the vas and the vessels, and visualized them to their convergence and beyond in the retroperitoneum.   Once dissection was completed a large left sided BARD 3D Light mesh was placed and secured at three points with interrupted 0 Vicryl to the pubic tubercle, Cooper's ligament and anteriorly.    Second look revealed no complications or injuries. Attention then was turned to the opposite side. The sac was also reduced and dissected free from adjacent structures. We preserved the vas and the vessels, in like manner with adequate posterior dissection. Once dissection was completed the same, but contralateral mesh was placed and secured in like manner with interrupted 0 Vicryl. There was good coverage of the  direct, indirect and femoral spaces.  The flap was then closed with 2-0 V-lock suture.  Peritoneal closure without defects.   I then utilized an additional  V-Loc suture to secure the free cephalad aspect of the Composix mesh to the umbilical hernia repair site.  Once assuring that hemostasis was adequate, all needles/sponges removed, and the robot was undocked.  The ports were removed, the abdomen desulflated.  4-0 subcuticular Monocryl was used at all skin edges. Dermabond was placed.  Patient tolerated the procedure well. There were no complications. He was taken to the recovery room in stable condition.           Ronny Bacon, M.D., FACS 08/05/2021, 2:55 PM

## 2021-08-05 NOTE — Anesthesia Postprocedure Evaluation (Signed)
Anesthesia Post Note  Patient: Norton Bivins.  Procedure(s) Performed: XI ROBOTIC ASSISTED INGUINAL HERNIA REPAIR WITH MESH (Bilateral)  Patient location during evaluation: PACU Anesthesia Type: General Level of consciousness: awake and alert Pain management: pain level controlled Vital Signs Assessment: post-procedure vital signs reviewed and stable Respiratory status: spontaneous breathing, nonlabored ventilation, respiratory function stable and patient connected to nasal cannula oxygen Cardiovascular status: blood pressure returned to baseline and stable Postop Assessment: no apparent nausea or vomiting Anesthetic complications: no   No notable events documented.   Last Vitals:  Vitals:   08/05/21 1537 08/05/21 1553  BP:  122/74  Pulse:  70  Resp:  17  Temp: 36.4 C 36.7 C  SpO2:  97%    Last Pain:  Vitals:   08/05/21 1553  TempSrc: Temporal  PainSc: 4                  Martha Clan

## 2021-08-05 NOTE — Anesthesia Preprocedure Evaluation (Signed)
Anesthesia Evaluation  Patient identified by MRN, date of birth, ID band Patient awake    Reviewed: Allergy & Precautions, H&P , NPO status , Patient's Chart, lab work & pertinent test results, reviewed documented beta blocker date and time   History of Anesthesia Complications Negative for: history of anesthetic complications  Airway Mallampati: IV  TM Distance: >3 FB Neck ROM: full    Dental  (+) Dental Advidsory Given, Teeth Intact, Missing, Chipped, Loose   Pulmonary shortness of breath, asthma , neg sleep apnea, neg recent URI,    Pulmonary exam normal        Cardiovascular Exercise Tolerance: Good negative cardio ROS Normal cardiovascular exam     Neuro/Psych negative neurological ROS  negative psych ROS   GI/Hepatic Neg liver ROS, hiatal hernia,   Endo/Other  negative endocrine ROS  Renal/GU negative Renal ROS  negative genitourinary   Musculoskeletal   Abdominal   Peds  Hematology negative hematology ROS (+)   Anesthesia Other Findings Past Medical History: No date: Asthma No date: Lung nodules No date: Tonsil cancer ([place])   Reproductive/Obstetrics negative OB ROS                             Anesthesia Physical  Anesthesia Plan  ASA: 2  Anesthesia Plan: General   Post-op Pain Management:    Induction: Intravenous  PONV Risk Score and Plan: 2 and Ondansetron, Dexamethasone, Treatment may vary due to age or medical condition and Midazolam  Airway Management Planned: Oral ETT  Additional Equipment:   Intra-op Plan:   Post-operative Plan: Extubation in OR  Informed Consent: I have reviewed the patients History and Physical, chart, labs and discussed the procedure including the risks, benefits and alternatives for the proposed anesthesia with the patient or authorized representative who has indicated his/her understanding and acceptance.     Dental Advisory  Given  Plan Discussed with: Anesthesiologist, CRNA and Surgeon  Anesthesia Plan Comments:         Anesthesia Quick Evaluation

## 2021-08-05 NOTE — Discharge Instructions (Addendum)
AMBULATORY SURGERY  DISCHARGE INSTRUCTIONS   The drugs that you were given will stay in your system until tomorrow so for the next 24 hours you should not:  Drive an automobile Make any legal decisions Drink any alcoholic beverage   You may resume regular meals tomorrow.  Today it is better to start with liquids and gradually work up to solid foods.  You may eat anything you prefer, but it is better to start with liquids, then soup and crackers, and gradually work up to solid foods.   Please notify your doctor immediately if you have any unusual bleeding, trouble breathing, redness and pain at the surgery site, drainage, fever, or pain not relieved by medication.    Additional Instructions:     WEAR GREEN EXPAREL BRACELET FOR THREE DAYS.    Since you were not instructed to urinate prior to leaving the hospital, we will need for you to go to the Emergency Department if you cannot urinate or you are not emptying your bladder within 4 hours of leaving the hospital and/or having abdominal distension.     Please contact your physician with any problems or Same Day Surgery at [phone], Monday through Friday 6 am to 4 pm, or [place] at [facility] number at [phone].

## 2021-08-05 NOTE — Interval H&P Note (Signed)
History and Physical Interval Note:  08/05/2021 12:20 PM  Patrick Mendoza.  has presented today for surgery, with the diagnosis of bilateral inguinal hernias.  The various methods of treatment have been discussed with the patient and family. After consideration of risks, benefits and other options for treatment, the patient has consented to  Procedure(s): XI ROBOTIC ASSISTED BILATERAL INGUINAL HERNIA (Bilateral) as a surgical intervention.  The patient's history has been reviewed, patient examined, no change in status, stable for surgery.  I have reviewed the patient's chart and labs.  Questions were answered to the patient's satisfaction.     Ronny Bacon

## 2021-08-08 ENCOUNTER — Encounter: Payer: Self-pay | Admitting: Surgery

## 2021-08-13 ENCOUNTER — Other Ambulatory Visit: Payer: Self-pay

## 2021-08-16 ENCOUNTER — Other Ambulatory Visit: Payer: Self-pay

## 2021-08-16 ENCOUNTER — Other Ambulatory Visit: Payer: Self-pay | Admitting: *Deleted

## 2021-08-16 ENCOUNTER — Inpatient Hospital Stay: Payer: Medicare Other | Attending: Oncology

## 2021-08-16 ENCOUNTER — Inpatient Hospital Stay: Payer: Medicare Other

## 2021-08-16 VITALS — BP 120/78 | HR 83 | Temp 97.7°F | Resp 18 | Wt 198.6 lb

## 2021-08-16 DIAGNOSIS — C099 Malignant neoplasm of tonsil, unspecified: Secondary | ICD-10-CM

## 2021-08-16 DIAGNOSIS — C109 Malignant neoplasm of oropharynx, unspecified: Secondary | ICD-10-CM | POA: Diagnosis present

## 2021-08-16 DIAGNOSIS — C771 Secondary and unspecified malignant neoplasm of intrathoracic lymph nodes: Secondary | ICD-10-CM | POA: Insufficient documentation

## 2021-08-16 DIAGNOSIS — Z79899 Other long term (current) drug therapy: Secondary | ICD-10-CM | POA: Insufficient documentation

## 2021-08-16 DIAGNOSIS — Z5112 Encounter for antineoplastic immunotherapy: Secondary | ICD-10-CM | POA: Diagnosis not present

## 2021-08-16 LAB — CBC WITH DIFFERENTIAL/PLATELET
Abs Immature Granulocytes: 0.03 10*3/uL (ref 0.00–0.07)
Basophils Absolute: 0.1 10*3/uL (ref 0.0–0.1)
Basophils Relative: 1 %
Eosinophils Absolute: 0.4 10*3/uL (ref 0.0–0.5)
Eosinophils Relative: 5 %
HCT: 38.3 % — ABNORMAL LOW (ref 39.0–52.0)
Hemoglobin: 13.1 g/dL (ref 13.0–17.0)
Immature Granulocytes: 0 %
Lymphocytes Relative: 12 %
Lymphs Abs: 1.1 10*3/uL (ref 0.7–4.0)
MCH: 32.5 pg (ref 26.0–34.0)
MCHC: 34.2 g/dL (ref 30.0–36.0)
MCV: 95 fL (ref 80.0–100.0)
Monocytes Absolute: 0.7 10*3/uL (ref 0.1–1.0)
Monocytes Relative: 8 %
Neutro Abs: 6.9 10*3/uL (ref 1.7–7.7)
Neutrophils Relative %: 74 %
Platelets: 288 10*3/uL (ref 150–400)
RBC: 4.03 MIL/uL — ABNORMAL LOW (ref 4.22–5.81)
RDW: 12.2 % (ref 11.5–15.5)
WBC: 9.2 10*3/uL (ref 4.0–10.5)
nRBC: 0 % (ref 0.0–0.2)

## 2021-08-16 LAB — COMPREHENSIVE METABOLIC PANEL
ALT: 12 U/L (ref 0–44)
AST: 18 U/L (ref 15–41)
Albumin: 3.8 g/dL (ref 3.5–5.0)
Alkaline Phosphatase: 82 U/L (ref 38–126)
Anion gap: 8 (ref 5–15)
BUN: 17 mg/dL (ref 8–23)
CO2: 26 mmol/L (ref 22–32)
Calcium: 9 mg/dL (ref 8.9–10.3)
Chloride: 104 mmol/L (ref 98–111)
Creatinine, Ser: 0.72 mg/dL (ref 0.61–1.24)
GFR, Estimated: >60 mL/min (ref >60)
Glucose, Bld: 108 mg/dL — ABNORMAL HIGH (ref 70–99)
Potassium: 3.7 mmol/L (ref 3.5–5.1)
Sodium: 138 mmol/L (ref 135–145)
Total Bilirubin: 0.5 mg/dL (ref 0.3–1.2)
Total Protein: 7.1 g/dL (ref 6.5–8.1)

## 2021-08-16 LAB — TSH: TSH: 2.54 u[IU]/mL (ref 0.350–4.500)

## 2021-08-16 MED ORDER — SODIUM CHLORIDE 0.9% FLUSH
10.0000 mL | INTRAVENOUS | Status: DC | PRN
  Administered 2021-08-16: 13:00:00 10 mL via INTRAVENOUS
  Filled 2021-08-16: qty 10

## 2021-08-16 MED ORDER — SODIUM CHLORIDE 0.9 % IV SOLN
200.0000 mg | Freq: Once | INTRAVENOUS | Status: AC
  Administered 2021-08-16: 14:00:00 200 mg via INTRAVENOUS
  Filled 2021-08-16: qty 8

## 2021-08-16 MED ORDER — HEPARIN SOD (PORK) LOCK FLUSH 100 UNIT/ML IV SOLN
500.0000 [IU] | Freq: Once | INTRAVENOUS | Status: AC
  Filled 2021-08-16: qty 5

## 2021-08-16 MED ORDER — SODIUM CHLORIDE 0.9 % IV SOLN
Freq: Once | INTRAVENOUS | Status: AC
  Filled 2021-08-16: qty 250

## 2021-08-16 MED ORDER — HEPARIN SOD (PORK) LOCK FLUSH 100 UNIT/ML IV SOLN
INTRAVENOUS | Status: AC
  Administered 2021-08-16: 15:00:00 500 [IU] via INTRAVENOUS
  Filled 2021-08-16: qty 5

## 2021-08-16 MED ORDER — PROCHLORPERAZINE EDISYLATE 10 MG/2ML IJ SOLN
10.0000 mg | Freq: Once | INTRAMUSCULAR | Status: AC
  Administered 2021-08-16: 14:00:00 10 mg via INTRAVENOUS
  Filled 2021-08-16: qty 2

## 2021-08-16 NOTE — Progress Notes (Signed)
Tsh orders

## 2021-08-16 NOTE — Patient Instructions (Signed)
[facility] [place]   Discharge Instructions: Thank you for choosing [facility] to provide your oncology and hematology care.  If you have a lab appointment with the [facility], please go directly to the [facility] and check in at the registration area.   Wear comfortable clothing and clothing appropriate for easy access to any Portacath or PICC line.   We strive to give you quality time with your provider. You may need to reschedule your appointment if you arrive late (15 or more minutes).  Arriving late affects you and other patients whose appointments are after yours.  Also, if you miss three or more appointments without notifying the office, you may be dismissed from the clinic at the providers discretion.      For prescription refill requests, have your pharmacy contact our office and allow 72 hours for refills to be completed.    Today you received the following chemotherapy and/or immunotherapy agents: Keytruda.      To help prevent nausea and vomiting after your treatment, we encourage you to take your nausea medication as directed.  BELOW ARE SYMPTOMS THAT SHOULD BE REPORTED IMMEDIATELY: *FEVER GREATER THAN 100.4 F (38 C) OR HIGHER *CHILLS OR SWEATING *NAUSEA AND VOMITING THAT IS NOT CONTROLLED WITH YOUR NAUSEA MEDICATION *UNUSUAL SHORTNESS OF BREATH *UNUSUAL BRUISING OR BLEEDING *URINARY PROBLEMS (pain or burning when urinating, or frequent urination) *BOWEL PROBLEMS (unusual diarrhea, constipation, pain near the anus) TENDERNESS IN MOUTH AND THROAT WITH OR WITHOUT PRESENCE OF ULCERS (sore throat, sores in mouth, or a toothache) UNUSUAL RASH, SWELLING OR PAIN  UNUSUAL VAGINAL DISCHARGE OR ITCHING   Items with * indicate a potential emergency and should be followed up as soon as possible or go to the Emergency Department if any problems should occur.  Please show the CHEMOTHERAPY ALERT CARD or IMMUNOTHERAPY ALERT CARD at check-in  to the Emergency Department and triage nurse.  Should you have questions after your visit or need to cancel or reschedule your appointment, please contact [facility]  Dept: [phone]  and follow the prompts.  Office hours are 8:00 a.m. to 4:30 p.m. Monday - Friday. Please note that voicemails left after 4:00 p.m. may not be returned until the following business day.  We are closed weekends and major holidays. You have access to a nurse at all times for urgent questions. Please call the main number to the clinic Dept: [phone] and follow the prompts.  For any non-urgent questions, you may also contact your provider using MyChart. We now offer e-Visits for anyone 25 and older to request care online for non-urgent symptoms. For details visit [email or web address].   Also download the MyChart app! Go to the app store, search "MyChart", open the app, select [facility], and log in with your MyChart username and password.  Due to Covid, a mask is required upon entering the hospital/clinic. If you do not have a mask, one will be given to you upon arrival. For doctor visits, patients may have 1 support person aged 62 or older with them. For treatment visits, patients cannot have anyone with them due to current Covid guidelines and our immunocompromised population.

## 2021-08-23 ENCOUNTER — Encounter: Payer: Self-pay | Admitting: Physician Assistant

## 2021-08-23 ENCOUNTER — Ambulatory Visit (INDEPENDENT_AMBULATORY_CARE_PROVIDER_SITE_OTHER): Payer: Medicare Other | Admitting: Physician Assistant

## 2021-08-23 ENCOUNTER — Encounter: Payer: Medicare Other | Admitting: Physician Assistant

## 2021-08-23 ENCOUNTER — Other Ambulatory Visit: Payer: Self-pay

## 2021-08-23 DIAGNOSIS — K4020 Bilateral inguinal hernia, without obstruction or gangrene, not specified as recurrent: Secondary | ICD-10-CM

## 2021-08-23 DIAGNOSIS — Z09 Encounter for follow-up examination after completed treatment for conditions other than malignant neoplasm: Secondary | ICD-10-CM

## 2021-08-23 NOTE — Progress Notes (Signed)
[facility] POST-OP OFFICE VISIT  08/23/2021  HPI: Patrick Mendoza. is a 70 y.o. male 18 days s/p robotic assisted laparoscopic bilateral inguinal hernia repair with Dr Christian Mate   Doing well Some intermittent soreness in groins; only needing tylenol No fever, chills, nausea, emesis or bowel changes No other complaints   Physical Exam: Constitutional: Well appearing male, NAD Abdomen: Soft, some soreness and expected induration in bilateral inguinal region, otherwise non-tender, non-distended, no rebound/guarding Skin: Laparoscopic incisions are well healed, no erythema  Assessment/Plan: This is a 70 y.o. male 18 days s/p robotic assisted laparoscopic bilateral inguinal hernia repair   - Pain control prn  - Reviewed lifting restrictions; 6 weeks total  - He can follow up on as needed basis; He understands to call with questions/concerns   -- Patrick Simon, PA-C [facility] 08/23/2021, 10:40 AM [phone] M-F: 7am - 4pm

## 2021-08-23 NOTE — Patient Instructions (Signed)

## 2021-09-06 ENCOUNTER — Encounter: Payer: Self-pay | Admitting: Oncology

## 2021-09-06 ENCOUNTER — Ambulatory Visit: Payer: Medicare Other

## 2021-09-06 ENCOUNTER — Inpatient Hospital Stay: Payer: Medicare Other | Attending: Oncology

## 2021-09-06 ENCOUNTER — Other Ambulatory Visit: Payer: Medicare Other

## 2021-09-06 ENCOUNTER — Inpatient Hospital Stay: Payer: Medicare Other

## 2021-09-06 ENCOUNTER — Inpatient Hospital Stay: Payer: Medicare Other | Admitting: Oncology

## 2021-09-06 ENCOUNTER — Other Ambulatory Visit: Payer: Self-pay

## 2021-09-06 VITALS — BP 113/78 | HR 72 | Temp 97.7°F | Resp 18 | Ht 70.0 in | Wt 197.0 lb

## 2021-09-06 DIAGNOSIS — C099 Malignant neoplasm of tonsil, unspecified: Secondary | ICD-10-CM

## 2021-09-06 DIAGNOSIS — C109 Malignant neoplasm of oropharynx, unspecified: Secondary | ICD-10-CM | POA: Insufficient documentation

## 2021-09-06 DIAGNOSIS — Z5112 Encounter for antineoplastic immunotherapy: Secondary | ICD-10-CM | POA: Diagnosis present

## 2021-09-06 DIAGNOSIS — Z79899 Other long term (current) drug therapy: Secondary | ICD-10-CM | POA: Diagnosis not present

## 2021-09-06 LAB — CBC WITH DIFFERENTIAL/PLATELET
Abs Immature Granulocytes: 0.02 10*3/uL (ref 0.00–0.07)
Basophils Absolute: 0 10*3/uL (ref 0.0–0.1)
Basophils Relative: 0 %
Eosinophils Absolute: 0.2 10*3/uL (ref 0.0–0.5)
Eosinophils Relative: 3 %
HCT: 37.8 % — ABNORMAL LOW (ref 39.0–52.0)
Hemoglobin: 12.7 g/dL — ABNORMAL LOW (ref 13.0–17.0)
Immature Granulocytes: 0 %
Lymphocytes Relative: 16 %
Lymphs Abs: 1.2 10*3/uL (ref 0.7–4.0)
MCH: 32.1 pg (ref 26.0–34.0)
MCHC: 33.6 g/dL (ref 30.0–36.0)
MCV: 95.5 fL (ref 80.0–100.0)
Monocytes Absolute: 0.8 10*3/uL (ref 0.1–1.0)
Monocytes Relative: 10 %
Neutro Abs: 5.4 10*3/uL (ref 1.7–7.7)
Neutrophils Relative %: 71 %
Platelets: 270 10*3/uL (ref 150–400)
RBC: 3.96 MIL/uL — ABNORMAL LOW (ref 4.22–5.81)
RDW: 12.7 % (ref 11.5–15.5)
WBC: 7.7 10*3/uL (ref 4.0–10.5)
nRBC: 0 % (ref 0.0–0.2)

## 2021-09-06 LAB — COMPREHENSIVE METABOLIC PANEL
ALT: 11 U/L (ref 0–44)
AST: 18 U/L (ref 15–41)
Albumin: 3.8 g/dL (ref 3.5–5.0)
Alkaline Phosphatase: 82 U/L (ref 38–126)
Anion gap: 7 (ref 5–15)
BUN: 20 mg/dL (ref 8–23)
CO2: 24 mmol/L (ref 22–32)
Calcium: 8.8 mg/dL — ABNORMAL LOW (ref 8.9–10.3)
Chloride: 104 mmol/L (ref 98–111)
Creatinine, Ser: 0.9 mg/dL (ref 0.61–1.24)
GFR, Estimated: >60 mL/min (ref >60)
Glucose, Bld: 99 mg/dL (ref 70–99)
Potassium: 3.4 mmol/L — ABNORMAL LOW (ref 3.5–5.1)
Sodium: 135 mmol/L (ref 135–145)
Total Bilirubin: 0.6 mg/dL (ref 0.3–1.2)
Total Protein: 7.1 g/dL (ref 6.5–8.1)

## 2021-09-06 MED ORDER — SODIUM CHLORIDE 0.9 % IV SOLN
Freq: Once | INTRAVENOUS | Status: AC
  Filled 2021-09-06: qty 250

## 2021-09-06 MED ORDER — HEPARIN SOD (PORK) LOCK FLUSH 100 UNIT/ML IV SOLN
INTRAVENOUS | Status: AC
  Filled 2021-09-06: qty 5

## 2021-09-06 MED ORDER — HEPARIN SOD (PORK) LOCK FLUSH 100 UNIT/ML IV SOLN
500.0000 [IU] | Freq: Once | INTRAVENOUS | Status: AC | PRN
  Administered 2021-09-06: 500 [IU]
  Filled 2021-09-06: qty 5

## 2021-09-06 MED ORDER — SODIUM CHLORIDE 0.9% FLUSH
10.0000 mL | INTRAVENOUS | Status: DC | PRN
  Administered 2021-09-06: 10 mL
  Filled 2021-09-06: qty 10

## 2021-09-06 MED ORDER — SODIUM CHLORIDE 0.9 % IV SOLN
200.0000 mg | Freq: Once | INTRAVENOUS | Status: AC
  Administered 2021-09-06: 200 mg via INTRAVENOUS
  Filled 2021-09-06: qty 8

## 2021-09-06 MED ORDER — PROCHLORPERAZINE EDISYLATE 10 MG/2ML IJ SOLN
10.0000 mg | Freq: Once | INTRAMUSCULAR | Status: AC
  Administered 2021-09-06: 10 mg via INTRAVENOUS
  Filled 2021-09-06: qty 2

## 2021-09-06 NOTE — Progress Notes (Signed)
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 Richardson Landry for symptoms of sore throat and difficulty swallowing which she has been experiencing over the last 1 year.He underwent a comprehensive auto laryngoscopy exam which showed a firm enlarged right tonsillar mass with necrotic area extending onto the adjacent soft palate into the root of the uvula.  Hypopharynx and larynx as well as nasopharynx was unable to be visualized due to gag reflex.  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   PET CT scan showed large hypermetabolic right palatine tonsil mass which crosses the midline.  Ipsilateral cervical nodal metastases.  Bilateral pulmonary nodules including a dominant 1 cm hypermetabolic right lung base nodule.  Multiplicity favor pulmonary metastases.  Subcarinal and equivocal left hilar nodal metastases.  Hypermetabolism involving the ascending colon.  Question mild diverticulitis versus underlying colonic mass or polyp cannot be excluded     Patient underwent EBUS guided biopsy of hilar lymph nodes.  Biopsy showed squamous cell carcinoma.  However HPV/p16 status was uninterpretable.    Patient completed 6 cycles of carbotaxol chemotherapy along with Keytruda and is currently on maintenance Keytruda.    Interval history-he is doing well and denies any specific  complaints at this time.  Tolerating immunotherapy well without any significant side effects  ECOG PS- 1 Pain scale- 0 Opioid associated constipation- no  Review of systems- Review of Systems  Constitutional:  Negative for chills, fever, malaise/fatigue and weight loss.  HENT:  Negative for congestion, ear discharge and nosebleeds.   Eyes:  Negative for blurred vision.  Respiratory:  Negative for cough, hemoptysis, sputum production, shortness of breath and wheezing.   Cardiovascular:  Negative for chest pain, palpitations, orthopnea and claudication.  Gastrointestinal:  Negative for abdominal pain, blood in stool, constipation, diarrhea, heartburn, melena, nausea and vomiting.  Genitourinary:  Negative for dysuria, flank pain, frequency, hematuria and urgency.  Musculoskeletal:  Negative for back pain, joint pain and myalgias.  Skin:  Negative for rash.  Neurological:  Negative for dizziness, tingling, focal weakness, seizures, weakness and headaches.  Endo/Heme/Allergies:  Does not bruise/bleed easily.  Psychiatric/Behavioral:  Negative for depression and suicidal ideas. The patient does not have insomnia.       No Known Allergies   Past Medical History:  Diagnosis Date   Asthma    History of hiatal hernia    Inguinal hernia recurrent unilateral    Lung nodules    Metastasis to lung (HCC)    tonsil cancer and mets to lung   Tonsil cancer ([place])    Tonsil cancer [facility])      Past Surgical History:  Procedure Laterality Date   CERVICAL SPINE SURGERY     LUMBAR SPINE SURGERY     PORTA CATH INSERTION N/A 10/08/2019   Procedure: PORTA CATH INSERTION;  Surgeon: Katha Cabal, MD;  Location: [facility];  Service: Cardiovascular;  Laterality: N/A;  VIDEO BRONCHOSCOPY WITH ENDOBRONCHIAL NAVIGATION N/A 10/10/2019   Procedure: VIDEO BRONCHOSCOPY WITH ENDOBRONCHIAL NAVIGATION;  Surgeon: Tyler Pita, MD;  Location: [facility] ORS;  Service: Pulmonary;  Laterality: N/A;    VIDEO BRONCHOSCOPY WITH ENDOBRONCHIAL ULTRASOUND N/A 10/10/2019   Procedure: VIDEO BRONCHOSCOPY WITH ENDOBRONCHIAL ULTRASOUND;  Surgeon: Tyler Pita, MD;  Location: [facility] OR[facility];  Service: Pulmonary;  Laterality: N/A;    Social History   Socioeconomic History   Marital status: Widowed    Spouse name: Not on file   Number of children: Not on file   Years of education: Not on file   Highest education level: Not on file  Occupational History   Not on file  Tobacco Use   Smoking status: Never   Smokeless tobacco: Never  Vaping Use   Vaping Use: Never used  Substance and Sexual Activity   Alcohol use: Never   Drug use: Never   Sexual activity: Not Currently  Other Topics Concern   Not on file  Social History Narrative   Not on file   Social Determinants of Health   Financial Resource Strain: Not on file  Food Insecurity: Not on file  Transportation Needs: Not on file  Physical Activity: Not on file  Stress: Not on file  Social Connections: Not on file  Intimate Partner Violence: Not on file    Family History  Problem Relation Age of Onset   Cancer Mother 63       not sure what type   Prostate cancer Father    Rheum arthritis Father    Healthy Sister    Healthy Brother    Healthy Sister    Healthy Brother    Healthy Brother    Heart disease Brother    Heart disease Brother      Current Outpatient Medications:    acetaminophen (TYLENOL) 500 MG tablet, Take 2,000 mg by mouth 3 (three) times daily as needed for moderate pain., Disp: , Rfl:    budesonide-formoterol (SYMBICORT) 160-4.5 MCG/ACT inhaler, Inhale 2 puffs into the lungs 2 (two) times daily., Disp: , Rfl:    EPINEPHrine (PRIMATENE MIST) 0.125 MG/ACT AERO, Inhale 1 puff into the lungs daily as needed (shortness of breath)., Disp: , Rfl:    ibuprofen (ADVIL) 800 MG tablet, Take 1 tablet (800 mg total) by mouth every 8 (eight) hours as needed., Disp: 30 tablet, Rfl: 0   oxycodone (OXY-IR) 5 MG capsule,  Take 5 mg by mouth every 4 (four) hours as needed., Disp: , Rfl:    Pembrolizumab (KEYTRUDA IV), Inject 1 Dose into the vein See admin instructions. Every 3rd Tuesday, Disp: , Rfl:  No current facility-administered medications for this visit.  Facility-Administered Medications Ordered in Other Visits:    heparin lock flush 100 UNIT/ML injection, , , ,    sodium chloride flush (NS) 0.9 % injection 10 mL, 10 mL, Intravenous, PRN, Sindy Guadeloupe, MD, 10 mL at 03/09/20 0919   sodium chloride flush (NS) 0.9 % injection 10 mL, 10 mL, Intracatheter, PRN, Sindy Guadeloupe, MD, 10 mL at 09/06/21 1018  Physical exam:  Vitals:   09/06/21 0943  BP: 113/78  Pulse: 72  Resp: 18  Temp: 97.7 F (36.5 C)  TempSrc: Tympanic  SpO2: 99%  Weight: 197 lb (89.4 kg)  Height: 5\' 10"  (1.778 m)   Physical Exam Constitutional:      General: He is not in acute distress. Neck:     Comments: There is a persistent palpable solitary level  2 cervical lymph node roughly 1.5 cm in size Cardiovascular:     Rate and Rhythm: Normal rate and regular rhythm.     Heart sounds: Normal heart sounds.  Pulmonary:     Effort: Pulmonary effort is normal.     Breath sounds: Normal breath sounds.  Abdominal:     General: Bowel sounds are normal.     Palpations: Abdomen is soft.  Skin:    General: Skin is warm and dry.  Neurological:     Mental Status: He is alert and oriented to person, place, and time.     CMP Latest Ref Rng & Units 09/06/2021  Glucose 70 - 99 mg/dL 99  BUN 8 - 23 mg/dL 20  Creatinine 0.61 - 1.24 mg/dL 0.90  Sodium 135 - 145 mmol/L 135  Potassium 3.5 - 5.1 mmol/L 3.4(L)  Chloride 98 - 111 mmol/L 104  CO2 22 - 32 mmol/L 24  Calcium 8.9 - 10.3 mg/dL 8.8(L)  Total Protein 6.5 - 8.1 g/dL 7.1  Total Bilirubin 0.3 - 1.2 mg/dL 0.6  Alkaline Phos 38 - 126 U/L 82  AST 15 - 41 U/L 18  ALT 0 - 44 U/L 11   CBC Latest Ref Rng & Units 09/06/2021  WBC 4.0 - 10.5 K/uL 7.7  Hemoglobin 13.0 - 17.0 g/dL 12.7(L)   Hematocrit 39.0 - 52.0 % 37.8(L)  Platelets 150 - 400 K/uL 270    No images are attached to the encounter.  No results found.   Assessment and plan- Patient is a 71 y.o. 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  He is here for on treatment assessment prior to cycle 28 of palliative Keytruda  Counts ok to proceed with cycle 28 of palliative Keytruda today.  I will see him back in 3 weeks for cycle 29.  Plan is to repeat scans sometime in February 2022.  I plan to continue Keytruda until June 2023 which would mark 2 years of adjuvant treatment.  If scans remain overall stable I will plan to observe him off treatment at that time.  If there is persistent level 2 cervical lymph node noted on the CT scans I will discuss at tumor board if there would be role for biopsy and potential radiation or surgery    Visit Diagnosis 1. Encounter for antineoplastic immunotherapy   2. 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 Randa Evens, MD, MPH [facility] [phone] 09/06/2021 1:10 PM

## 2021-09-06 NOTE — Patient Instructions (Signed)
[facility] AT [place]  Discharge Instructions: Thank you for choosing [facility] to provide your oncology and hematology care.  If you have a lab appointment with the [facility], please go directly to the [facility] and check in at the registration area.  Wear comfortable clothing and clothing appropriate for easy access to any Portacath or PICC line.   We strive to give you quality time with your provider. You may need to reschedule your appointment if you arrive late (15 or more minutes).  Arriving late affects you and other patients whose appointments are after yours.  Also, if you miss three or more appointments without notifying the office, you may be dismissed from the clinic at the providers discretion.      For prescription refill requests, have your pharmacy contact our office and allow 72 hours for refills to be completed.    Today you received the following chemotherapy and/or immunotherapy agents - pembrolizumab      To help prevent nausea and vomiting after your treatment, we encourage you to take your nausea medication as directed.  BELOW ARE SYMPTOMS THAT SHOULD BE REPORTED IMMEDIATELY: *FEVER GREATER THAN 100.4 F (38 C) OR HIGHER *CHILLS OR SWEATING *NAUSEA AND VOMITING THAT IS NOT CONTROLLED WITH YOUR NAUSEA MEDICATION *UNUSUAL SHORTNESS OF BREATH *UNUSUAL BRUISING OR BLEEDING *URINARY PROBLEMS (pain or burning when urinating, or frequent urination) *BOWEL PROBLEMS (unusual diarrhea, constipation, pain near the anus) TENDERNESS IN MOUTH AND THROAT WITH OR WITHOUT PRESENCE OF ULCERS (sore throat, sores in mouth, or a toothache) UNUSUAL RASH, SWELLING OR PAIN  UNUSUAL VAGINAL DISCHARGE OR ITCHING   Items with * indicate a potential emergency and should be followed up as soon as possible or go to the Emergency Department if any problems should occur.  Please show the CHEMOTHERAPY ALERT CARD or IMMUNOTHERAPY ALERT CARD at  check-in to the Emergency Department and triage nurse.  Should you have questions after your visit or need to cancel or reschedule your appointment, please contact [facility] [place] AT [place]  [phone] and follow the prompts.  Office hours are 8:00 a.m. to 4:30 p.m. Monday - Friday. Please note that voicemails left after 4:00 p.m. may not be returned until the following business day.  We are closed weekends and major holidays. You have access to a nurse at all times for urgent questions. Please call the main number to the clinic [phone] and follow the prompts.  For any non-urgent questions, you may also contact your provider using MyChart. We now offer e-Visits for anyone 10 and older to request care online for non-urgent symptoms. For details visit [email or web address].   Also download the MyChart app! Go to the app store, search "MyChart", open the app, select [facility], and log in with your MyChart username and password.  Due to Covid, a mask is required upon entering the hospital/clinic. If you do not have a mask, one will be given to you upon arrival. For doctor visits, patients may have 1 support person aged 90 or older with them. For treatment visits, patients cannot have anyone with them due to current Covid guidelines and our immunocompromised population.   Pembrolizumab injection What is this medication? PEMBROLIZUMAB (pem broe liz ue mab) is a monoclonal antibody. It is used to treat certain types of cancer. This medicine may be used for other purposes; ask your health care provider or pharmacist if you have questions. COMMON BRAND NAME(S): Keytruda What should I tell my care  team before I take this medication? They need to know if you have any of these conditions: autoimmune diseases like Crohn's disease, ulcerative colitis, or lupus have had or planning to have an allogeneic stem cell transplant (uses someone else's stem cells) history of organ  transplant history of chest radiation nervous system problems like myasthenia gravis or Guillain-Barre syndrome an unusual or allergic reaction to pembrolizumab, other medicines, foods, dyes, or preservatives pregnant or trying to get pregnant breast-feeding How should I use this medication? This medicine is for infusion into a vein. It is given by a health care professional in a hospital or clinic setting. A special MedGuide will be given to you before each treatment. Be sure to read this information carefully each time. Talk to your pediatrician regarding the use of this medicine in children. While this drug may be prescribed for children as young as 6 months for selected conditions, precautions do apply. Overdosage: If you think you have taken too much of this medicine contact a poison control center or emergency room at once. NOTE: This medicine is only for you. Do not share this medicine with others. What if I miss a dose? It is important not to miss your dose. Call your doctor or health care professional if you are unable to keep an appointment. What may interact with this medication? Interactions have not been studied. This list may not describe all possible interactions. Give your health care provider a list of all the medicines, herbs, non-prescription drugs, or dietary supplements you use. Also tell them if you smoke, drink alcohol, or use illegal drugs. Some items may interact with your medicine. What should I watch for while using this medication? Your condition will be monitored carefully while you are receiving this medicine. You may need blood work done while you are taking this medicine. Do not become pregnant while taking this medicine or for 4 months after stopping it. Women should inform their doctor if they wish to become pregnant or think they might be pregnant. There is a potential for serious side effects to an unborn child. Talk to your health care professional or  pharmacist for more information. Do not breast-feed an infant while taking this medicine or for 4 months after the last dose. What side effects may I notice from receiving this medication? Side effects that you should report to your doctor or health care professional as soon as possible: allergic reactions like skin rash, itching or hives, swelling of the face, lips, or tongue bloody or black, tarry breathing problems changes in vision chest pain chills confusion constipation cough diarrhea dizziness or feeling faint or lightheaded fast or irregular heartbeat fever flushing joint pain low blood counts - this medicine may decrease the number of white blood cells, red blood cells and platelets. You may be at increased risk for infections and bleeding. muscle pain muscle weakness pain, tingling, numbness in the hands or feet persistent headache redness, blistering, peeling or loosening of the skin, including inside the mouth signs and symptoms of high blood sugar such as dizziness; dry mouth; dry skin; fruity breath; nausea; stomach pain; increased hunger or thirst; increased urination signs and symptoms of kidney injury like trouble passing urine or change in the amount of urine signs and symptoms of liver injury like dark urine, light-colored stools, loss of appetite, nausea, right upper belly pain, yellowing of the eyes or skin sweating swollen lymph nodes weight loss Side effects that usually do not require medical attention (report to your doctor  or health care professional if they continue or are bothersome): decreased appetite hair loss tiredness This list may not describe all possible side effects. Call your doctor for medical advice about side effects. You may report side effects to FDA at 1-800-FDA-1088. Where should I keep my medication? This drug is given in a hospital or clinic and will not be stored at home. NOTE: This sheet is a summary. It may not cover all possible  information. If you have questions about this medicine, talk to your doctor, pharmacist, or health care provider.  2022 Elsevier/Gold Standard (2021-05-03 00:00:00)

## 2021-09-27 ENCOUNTER — Inpatient Hospital Stay: Payer: Medicare Other

## 2021-09-27 ENCOUNTER — Inpatient Hospital Stay: Payer: Medicare Other | Admitting: Oncology

## 2021-09-27 ENCOUNTER — Encounter: Payer: Self-pay | Admitting: Oncology

## 2021-09-27 ENCOUNTER — Other Ambulatory Visit: Payer: Self-pay

## 2021-09-27 VITALS — BP 121/76 | HR 67 | Temp 97.0°F | Resp 16 | Ht 70.0 in | Wt 195.9 lb

## 2021-09-27 DIAGNOSIS — Z5112 Encounter for antineoplastic immunotherapy: Secondary | ICD-10-CM | POA: Diagnosis not present

## 2021-09-27 DIAGNOSIS — C099 Malignant neoplasm of tonsil, unspecified: Secondary | ICD-10-CM

## 2021-09-27 LAB — COMPREHENSIVE METABOLIC PANEL
ALT: 9 U/L (ref 0–44)
AST: 20 U/L (ref 15–41)
Albumin: 3.9 g/dL (ref 3.5–5.0)
Alkaline Phosphatase: 78 U/L (ref 38–126)
Anion gap: 10 (ref 5–15)
BUN: 19 mg/dL (ref 8–23)
CO2: 23 mmol/L (ref 22–32)
Calcium: 8.8 mg/dL — ABNORMAL LOW (ref 8.9–10.3)
Chloride: 105 mmol/L (ref 98–111)
Creatinine, Ser: 0.91 mg/dL (ref 0.61–1.24)
GFR, Estimated: >60 mL/min (ref >60)
Glucose, Bld: 103 mg/dL — ABNORMAL HIGH (ref 70–99)
Potassium: 3.8 mmol/L (ref 3.5–5.1)
Sodium: 138 mmol/L (ref 135–145)
Total Bilirubin: 0.1 mg/dL — ABNORMAL LOW (ref 0.3–1.2)
Total Protein: 7.1 g/dL (ref 6.5–8.1)

## 2021-09-27 LAB — CBC WITH DIFFERENTIAL/PLATELET
Abs Immature Granulocytes: 0.02 10*3/uL (ref 0.00–0.07)
Basophils Absolute: 0 10*3/uL (ref 0.0–0.1)
Basophils Relative: 1 %
Eosinophils Absolute: 0.4 10*3/uL (ref 0.0–0.5)
Eosinophils Relative: 6 %
HCT: 38.9 % — ABNORMAL LOW (ref 39.0–52.0)
Hemoglobin: 12.9 g/dL — ABNORMAL LOW (ref 13.0–17.0)
Immature Granulocytes: 0 %
Lymphocytes Relative: 20 %
Lymphs Abs: 1.2 10*3/uL (ref 0.7–4.0)
MCH: 31.6 pg (ref 26.0–34.0)
MCHC: 33.2 g/dL (ref 30.0–36.0)
MCV: 95.3 fL (ref 80.0–100.0)
Monocytes Absolute: 0.5 10*3/uL (ref 0.1–1.0)
Monocytes Relative: 9 %
Neutro Abs: 3.7 10*3/uL (ref 1.7–7.7)
Neutrophils Relative %: 64 %
Platelets: 257 10*3/uL (ref 150–400)
RBC: 4.08 MIL/uL — ABNORMAL LOW (ref 4.22–5.81)
RDW: 12.9 % (ref 11.5–15.5)
WBC: 5.7 10*3/uL (ref 4.0–10.5)
nRBC: 0 % (ref 0.0–0.2)

## 2021-09-27 MED ORDER — HEPARIN SOD (PORK) LOCK FLUSH 100 UNIT/ML IV SOLN
500.0000 [IU] | Freq: Once | INTRAVENOUS | Status: AC | PRN
  Administered 2021-09-27: 500 [IU]
  Filled 2021-09-27: qty 5

## 2021-09-27 MED ORDER — SODIUM CHLORIDE 0.9 % IV SOLN
200.0000 mg | Freq: Once | INTRAVENOUS | Status: AC
  Administered 2021-09-27: 200 mg via INTRAVENOUS
  Filled 2021-09-27: qty 200

## 2021-09-27 MED ORDER — SODIUM CHLORIDE 0.9 % IV SOLN
Freq: Once | INTRAVENOUS | Status: AC
  Filled 2021-09-27: qty 250

## 2021-09-27 MED ORDER — PROCHLORPERAZINE EDISYLATE 10 MG/2ML IJ SOLN
10.0000 mg | Freq: Once | INTRAMUSCULAR | Status: AC
  Administered 2021-09-27: 10 mg via INTRAVENOUS
  Filled 2021-09-27: qty 2

## 2021-09-27 NOTE — Progress Notes (Signed)
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 Richardson Landry for symptoms of sore throat and difficulty swallowing which she has been experiencing over the last 1 year.He underwent a comprehensive auto laryngoscopy exam which showed a firm enlarged right tonsillar mass with necrotic area extending onto the adjacent soft palate into the root of the uvula.  Hypopharynx and larynx as well as nasopharynx was unable to be visualized due to gag reflex.  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   PET CT scan showed large hypermetabolic right palatine tonsil mass which crosses the midline.  Ipsilateral cervical nodal metastases.  Bilateral pulmonary nodules including a dominant 1 cm hypermetabolic right lung base nodule.  Multiplicity favor pulmonary metastases.  Subcarinal and equivocal left hilar nodal metastases.  Hypermetabolism involving the ascending colon.  Question mild diverticulitis versus underlying colonic mass or polyp cannot be excluded     Patient underwent EBUS guided biopsy of hilar lymph nodes.  Biopsy showed squamous cell carcinoma.  However HPV/p16 status was uninterpretable.    Patient completed 6 cycles of carbotaxol chemotherapy along with Keytruda and is currently on maintenance Keytruda.    Interval history-patient is tolerating Keytruda well denies  any complaints at this time.  He is keen to stop treatment if possible.  ECOG PS- 1 Pain scale- 0   Review of systems- Review of Systems  Constitutional:  Negative for chills, fever, malaise/fatigue and weight loss.  HENT:  Negative for congestion, ear discharge and nosebleeds.   Eyes:  Negative for blurred vision.  Respiratory:  Negative for cough, hemoptysis, sputum production, shortness of breath and wheezing.   Cardiovascular:  Negative for chest pain, palpitations, orthopnea and claudication.  Gastrointestinal:  Negative for abdominal pain, blood in stool, constipation, diarrhea, heartburn, melena, nausea and vomiting.  Genitourinary:  Negative for dysuria, flank pain, frequency, hematuria and urgency.  Musculoskeletal:  Negative for back pain, joint pain and myalgias.  Skin:  Negative for rash.  Neurological:  Negative for dizziness, tingling, focal weakness, seizures, weakness and headaches.  Endo/Heme/Allergies:  Does not bruise/bleed easily.  Psychiatric/Behavioral:  Negative for depression and suicidal ideas. The patient does not have insomnia.      No Known Allergies   Past Medical History:  Diagnosis Date   Asthma    History of hiatal hernia    Inguinal hernia recurrent unilateral    Lung nodules    Metastasis to lung (HCC)    tonsil cancer and mets to lung   Tonsil cancer ([place])    Tonsil cancer [facility])      Past Surgical History:  Procedure Laterality Date   CERVICAL SPINE SURGERY     LUMBAR SPINE SURGERY     PORTA CATH INSERTION N/A 10/08/2019   Procedure: PORTA CATH INSERTION;  Surgeon: Katha Cabal, MD;  Location: [facility];  Service: Cardiovascular;  Laterality: N/A;   VIDEO BRONCHOSCOPY WITH ENDOBRONCHIAL  NAVIGATION N/A 10/10/2019   Procedure: VIDEO BRONCHOSCOPY WITH ENDOBRONCHIAL NAVIGATION;  Surgeon: Tyler Pita, MD;  Location: [facility] ORS;  Service: Pulmonary;  Laterality: N/A;   VIDEO BRONCHOSCOPY WITH ENDOBRONCHIAL ULTRASOUND N/A  10/10/2019   Procedure: VIDEO BRONCHOSCOPY WITH ENDOBRONCHIAL ULTRASOUND;  Surgeon: Tyler Pita, MD;  Location: [facility] ORS;  Service: Pulmonary;  Laterality: N/A;    Social History   Socioeconomic History   Marital status: Widowed    Spouse name: Not on file   Number of children: Not on file   Years of education: Not on file   Highest education level: Not on file  Occupational History   Not on file  Tobacco Use   Smoking status: Never   Smokeless tobacco: Never  Vaping Use   Vaping Use: Never used  Substance and Sexual Activity   Alcohol use: Never   Drug use: Never   Sexual activity: Not Currently  Other Topics Concern   Not on file  Social History Narrative   Not on file   Social Determinants of Health   Financial Resource Strain: Not on file  Food Insecurity: Not on file  Transportation Needs: Not on file  Physical Activity: Not on file  Stress: Not on file  Social Connections: Not on file  Intimate Partner Violence: Not on file    Family History  Problem Relation Age of Onset   Cancer Mother 33       not sure what type   Prostate cancer Father    Rheum arthritis Father    Healthy Sister    Healthy Brother    Healthy Sister    Healthy Brother    Healthy Brother    Heart disease Brother    Heart disease Brother      Current Outpatient Medications:    acetaminophen (TYLENOL) 500 MG tablet, Take 2,000 mg by mouth 3 (three) times daily as needed for moderate pain., Disp: , Rfl:    budesonide-formoterol (SYMBICORT) 160-4.5 MCG/ACT inhaler, Inhale 2 puffs into the lungs 2 (two) times daily., Disp: , Rfl:    EPINEPHrine (PRIMATENE MIST) 0.125 MG/ACT AERO, Inhale 1 puff into the lungs daily as needed (shortness of breath)., Disp: , Rfl:    ibuprofen (ADVIL) 800 MG tablet, Take 1 tablet (800 mg total) by mouth every 8 (eight) hours as needed., Disp: 30 tablet, Rfl: 0   oxycodone (OXY-IR) 5 MG capsule, Take 5 mg by mouth every 4 (four) hours as needed., Disp:  , Rfl:    Pembrolizumab (KEYTRUDA IV), Inject 1 Dose into the vein See admin instructions. Every 3rd Tuesday, Disp: , Rfl:  No current facility-administered medications for this visit.  Facility-Administered Medications Ordered in Other Visits:    sodium chloride flush (NS) 0.9 % injection 10 mL, 10 mL, Intravenous, PRN, Sindy Guadeloupe, MD, 10 mL at 03/09/20 0919  Physical exam:  Vitals:   09/27/21 0925  BP: 121/76  Pulse: 67  Resp: 16  Temp: (!) 97 F (36.1 C)  TempSrc: Tympanic  SpO2: 100%  Weight: 195 lb 14.4 oz (88.9 kg)  Height: 5\' 10"  (1.778 m)   Physical Exam Constitutional:      General: He is not in acute distress. Neck:     Comments: Palpable right cervical lymph node Cardiovascular:     Rate and Rhythm: Normal rate and regular rhythm.     Heart sounds: Normal heart sounds.  Pulmonary:     Effort: Pulmonary effort is normal.     Breath sounds:  Normal breath sounds.  Skin:    General: Skin is warm and dry.  Neurological:     Mental Status: He is alert and oriented to person, place, and time.     CMP Latest Ref Rng & Units 09/27/2021  Glucose 70 - 99 mg/dL 103(H)  BUN 8 - 23 mg/dL 19  Creatinine 0.61 - 1.24 mg/dL 0.91  Sodium 135 - 145 mmol/L 138  Potassium 3.5 - 5.1 mmol/L 3.8  Chloride 98 - 111 mmol/L 105  CO2 22 - 32 mmol/L 23  Calcium 8.9 - 10.3 mg/dL 8.8(L)  Total Protein 6.5 - 8.1 g/dL 7.1  Total Bilirubin 0.3 - 1.2 mg/dL 0.1(L)  Alkaline Phos 38 - 126 U/L 78  AST 15 - 41 U/L 20  ALT 0 - 44 U/L 9   CBC Latest Ref Rng & Units 09/27/2021  WBC 4.0 - 10.5 K/uL 5.7  Hemoglobin 13.0 - 17.0 g/dL 12.9(L)  Hematocrit 39.0 - 52.0 % 38.9(L)  Platelets 150 - 400 K/uL 257    Assessment and plan- Patient is a 71 y.o. 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  He is here for on treatment assessment prior to cycle 29 of palliative Keytruda   Counts okay to proceed with cycle  29 of palliative Keytruda today.  I will see him back in 3 weeks for cycle 30.  He will be completing 2 years of maintenance Keytruda sometime in June 2023.  However he is keen to stop treatment sooner if possible.  Will reassess with repeat scans sometime end of February 2023   Visit Diagnosis 1. Encounter for antineoplastic immunotherapy   2. 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 Randa Evens, MD, MPH [facility] [phone] 09/27/2021 12:35 PM

## 2021-10-17 ENCOUNTER — Other Ambulatory Visit: Payer: Self-pay | Admitting: *Deleted

## 2021-10-17 DIAGNOSIS — C099 Malignant neoplasm of tonsil, unspecified: Secondary | ICD-10-CM

## 2021-10-17 DIAGNOSIS — R7989 Other specified abnormal findings of blood chemistry: Secondary | ICD-10-CM

## 2021-10-17 NOTE — Progress Notes (Signed)
Lab orders

## 2021-10-18 ENCOUNTER — Inpatient Hospital Stay: Payer: Medicare Other | Attending: Oncology | Admitting: Oncology

## 2021-10-18 ENCOUNTER — Inpatient Hospital Stay: Payer: Medicare Other

## 2021-10-18 ENCOUNTER — Other Ambulatory Visit: Payer: Self-pay

## 2021-10-18 ENCOUNTER — Encounter: Payer: Self-pay | Admitting: Oncology

## 2021-10-18 VITALS — BP 126/77 | HR 66 | Temp 97.1°F | Resp 18 | Ht 70.0 in | Wt 192.1 lb

## 2021-10-18 DIAGNOSIS — Z79899 Other long term (current) drug therapy: Secondary | ICD-10-CM | POA: Diagnosis not present

## 2021-10-18 DIAGNOSIS — C7800 Secondary malignant neoplasm of unspecified lung: Secondary | ICD-10-CM | POA: Insufficient documentation

## 2021-10-18 DIAGNOSIS — C099 Malignant neoplasm of tonsil, unspecified: Secondary | ICD-10-CM | POA: Diagnosis not present

## 2021-10-18 DIAGNOSIS — C109 Malignant neoplasm of oropharynx, unspecified: Secondary | ICD-10-CM | POA: Diagnosis present

## 2021-10-18 DIAGNOSIS — R7989 Other specified abnormal findings of blood chemistry: Secondary | ICD-10-CM

## 2021-10-18 DIAGNOSIS — Z5112 Encounter for antineoplastic immunotherapy: Secondary | ICD-10-CM | POA: Diagnosis not present

## 2021-10-18 DIAGNOSIS — C771 Secondary and unspecified malignant neoplasm of intrathoracic lymph nodes: Secondary | ICD-10-CM | POA: Diagnosis not present

## 2021-10-18 LAB — COMPREHENSIVE METABOLIC PANEL
ALT: 11 U/L (ref 0–44)
AST: 19 U/L (ref 15–41)
Albumin: 4 g/dL (ref 3.5–5.0)
Alkaline Phosphatase: 69 U/L (ref 38–126)
Anion gap: 11 (ref 5–15)
BUN: 14 mg/dL (ref 8–23)
CO2: 22 mmol/L (ref 22–32)
Calcium: 9.2 mg/dL (ref 8.9–10.3)
Chloride: 102 mmol/L (ref 98–111)
Creatinine, Ser: 0.85 mg/dL (ref 0.61–1.24)
GFR, Estimated: >60 mL/min (ref >60)
Glucose, Bld: 104 mg/dL — ABNORMAL HIGH (ref 70–99)
Potassium: 3.7 mmol/L (ref 3.5–5.1)
Sodium: 135 mmol/L (ref 135–145)
Total Bilirubin: 0.6 mg/dL (ref 0.3–1.2)
Total Protein: 7.1 g/dL (ref 6.5–8.1)

## 2021-10-18 LAB — CBC WITH DIFFERENTIAL/PLATELET
Abs Immature Granulocytes: 0.03 10*3/uL (ref 0.00–0.07)
Basophils Absolute: 0 10*3/uL (ref 0.0–0.1)
Basophils Relative: 1 %
Eosinophils Absolute: 0.5 10*3/uL (ref 0.0–0.5)
Eosinophils Relative: 7 %
HCT: 40.2 % (ref 39.0–52.0)
Hemoglobin: 13.5 g/dL (ref 13.0–17.0)
Immature Granulocytes: 1 %
Lymphocytes Relative: 20 %
Lymphs Abs: 1.2 10*3/uL (ref 0.7–4.0)
MCH: 32 pg (ref 26.0–34.0)
MCHC: 33.6 g/dL (ref 30.0–36.0)
MCV: 95.3 fL (ref 80.0–100.0)
Monocytes Absolute: 0.4 10*3/uL (ref 0.1–1.0)
Monocytes Relative: 7 %
Neutro Abs: 3.9 10*3/uL (ref 1.7–7.7)
Neutrophils Relative %: 64 %
Platelets: 234 10*3/uL (ref 150–400)
RBC: 4.22 MIL/uL (ref 4.22–5.81)
RDW: 13.2 % (ref 11.5–15.5)
WBC: 6.1 10*3/uL (ref 4.0–10.5)
nRBC: 0 % (ref 0.0–0.2)

## 2021-10-18 LAB — TSH: TSH: 2.738 u[IU]/mL (ref 0.350–4.500)

## 2021-10-18 MED ORDER — PROCHLORPERAZINE EDISYLATE 10 MG/2ML IJ SOLN
10.0000 mg | Freq: Once | INTRAMUSCULAR | Status: AC
  Administered 2021-10-18: 10 mg via INTRAVENOUS
  Filled 2021-10-18: qty 2

## 2021-10-18 MED ORDER — BUDESONIDE-FORMOTEROL FUMARATE 160-4.5 MCG/ACT IN AERO: 2.0000 | INHALATION_SPRAY | Freq: Two times a day (BID) | RESPIRATORY_TRACT | 3 refills | Status: DC

## 2021-10-18 MED ORDER — SODIUM CHLORIDE 0.9 % IV SOLN
200.0000 mg | Freq: Once | INTRAVENOUS | Status: AC
  Administered 2021-10-18: 200 mg via INTRAVENOUS
  Filled 2021-10-18: qty 200

## 2021-10-18 MED ORDER — SODIUM CHLORIDE 0.9 % IV SOLN
Freq: Once | INTRAVENOUS | Status: AC
  Filled 2021-10-18: qty 250

## 2021-10-18 MED ORDER — HEPARIN SOD (PORK) LOCK FLUSH 100 UNIT/ML IV SOLN
INTRAVENOUS | Status: AC
  Administered 2021-10-18: 500 [IU]
  Filled 2021-10-18: qty 5

## 2021-10-18 NOTE — Progress Notes (Signed)
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 Richardson Landry for symptoms of sore throat and difficulty swallowing which she has been experiencing over the last 1 year.He underwent a comprehensive auto laryngoscopy exam which showed a firm enlarged right tonsillar mass with necrotic area extending onto the adjacent soft palate into the root of the uvula.  Hypopharynx and larynx as well as nasopharynx was unable to be visualized due to gag reflex.  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   PET CT scan showed large hypermetabolic right palatine tonsil mass which crosses the midline.  Ipsilateral cervical nodal metastases.  Bilateral pulmonary nodules including a dominant 1 cm hypermetabolic right lung base nodule.  Multiplicity favor pulmonary metastases.  Subcarinal and equivocal left hilar nodal metastases.  Hypermetabolism involving the ascending colon.  Question mild diverticulitis versus underlying colonic mass or polyp cannot be excluded     Patient underwent EBUS guided biopsy of hilar lymph nodes.  Biopsy showed squamous cell carcinoma.  However HPV/p16 status was uninterpretable.    Patient completed 6 cycles of carbotaxol chemotherapy along with Keytruda and is currently on maintenance Keytruda.  Interval history-tolerating Keytruda well without any  significant side effects.  Would like to stop treatment after this cycle  ECOG PS- 0 Pain scale- 0   Review of systems- Review of Systems  Constitutional:  Negative for chills, fever, malaise/fatigue and weight loss.  HENT:  Negative for congestion, ear discharge and nosebleeds.   Eyes:  Negative for blurred vision.  Respiratory:  Negative for cough, hemoptysis, sputum production, shortness of breath and wheezing.   Cardiovascular:  Negative for chest pain, palpitations, orthopnea and claudication.  Gastrointestinal:  Negative for abdominal pain, blood in stool, constipation, diarrhea, heartburn, melena, nausea and vomiting.  Genitourinary:  Negative for dysuria, flank pain, frequency, hematuria and urgency.  Musculoskeletal:  Negative for back pain, joint pain and myalgias.  Skin:  Negative for rash.  Neurological:  Negative for dizziness, tingling, focal weakness, seizures, weakness and headaches.  Endo/Heme/Allergies:  Does not bruise/bleed easily.  Psychiatric/Behavioral:  Negative for depression and suicidal ideas. The patient does not have insomnia.    No Known Allergies   Past Medical History:  Diagnosis Date   Asthma    History of hiatal hernia    Inguinal hernia recurrent unilateral    Lung nodules    Metastasis to lung (HCC)    tonsil cancer and mets to lung   Tonsil cancer ([place])    Tonsil cancer [facility])      Past Surgical History:  Procedure Laterality Date   CERVICAL SPINE SURGERY     LUMBAR SPINE SURGERY     PORTA CATH INSERTION N/A 10/08/2019   Procedure: PORTA CATH INSERTION;  Surgeon: Katha Cabal, MD;  Location: [place] [facility];  Service: Cardiovascular;  Laterality: N/A;   VIDEO BRONCHOSCOPY WITH ENDOBRONCHIAL NAVIGATION N/A 10/10/2019   Procedure: VIDEO  BRONCHOSCOPY WITH ENDOBRONCHIAL NAVIGATION;  Surgeon: Tyler Pita, MD;  Location: [facility] ORS;  Service: Pulmonary;  Laterality: N/A;   VIDEO BRONCHOSCOPY WITH ENDOBRONCHIAL ULTRASOUND N/A  10/10/2019   Procedure: VIDEO BRONCHOSCOPY WITH ENDOBRONCHIAL ULTRASOUND;  Surgeon: Tyler Pita, MD;  Location: [facility] ORS;  Service: Pulmonary;  Laterality: N/A;    Social History   Socioeconomic History   Marital status: Widowed    Spouse name: Not on file   Number of children: Not on file   Years of education: Not on file   Highest education level: Not on file  Occupational History   Not on file  Tobacco Use   Smoking status: Never   Smokeless tobacco: Never  Vaping Use   Vaping Use: Never used  Substance and Sexual Activity   Alcohol use: Never   Drug use: Never   Sexual activity: Not Currently  Other Topics Concern   Not on file  Social History Narrative   Not on file   Social Determinants of Health   Financial Resource Strain: Not on file  Food Insecurity: Not on file  Transportation Needs: Not on file  Physical Activity: Not on file  Stress: Not on file  Social Connections: Not on file  Intimate Partner Violence: Not on file    Family History  Problem Relation Age of Onset   Cancer Mother 72       not sure what type   Prostate cancer Father    Rheum arthritis Father    Healthy Sister    Healthy Brother    Healthy Sister    Healthy Brother    Healthy Brother    Heart disease Brother    Heart disease Brother      Current Outpatient Medications:    acetaminophen (TYLENOL) 500 MG tablet, Take 2,000 mg by mouth 3 (three) times daily as needed for moderate pain., Disp: , Rfl:    EPINEPHrine (PRIMATENE MIST) 0.125 MG/ACT AERO, Inhale 1 puff into the lungs daily as needed (shortness of breath)., Disp: , Rfl:    ibuprofen (ADVIL) 800 MG tablet, Take 1 tablet (800 mg total) by mouth every 8 (eight) hours as needed., Disp: 30 tablet, Rfl: 0   oxycodone (OXY-IR) 5 MG capsule, Take 5 mg by mouth every 4 (four) hours as needed., Disp: , Rfl:    Pembrolizumab (KEYTRUDA IV), Inject 1 Dose into the vein See admin instructions. Every 3rd Tuesday, Disp: , Rfl:     budesonide-formoterol (SYMBICORT) 160-4.5 MCG/ACT inhaler, Inhale 2 puffs into the lungs 2 (two) times daily., Disp: 1 each, Rfl: 3 No current facility-administered medications for this visit.  Facility-Administered Medications Ordered in Other Visits:    sodium chloride flush (NS) 0.9 % injection 10 mL, 10 mL, Intravenous, PRN, Sindy Guadeloupe, MD, 10 mL at 03/09/20 0919  Physical exam:  Vitals:   10/18/21 0933  BP: 126/77  Pulse: 66  Resp: 18  Temp: (!) 97.1 F (36.2 C)  TempSrc: Tympanic  SpO2: 97%  Weight: 192 lb 1.6 oz (87.1 kg)  Height: 5\' 10"  (1.778 m)   Physical Exam Constitutional:      General: He is not in acute distress. Cardiovascular:     Rate and Rhythm: Normal rate and regular rhythm.     Heart sounds: Normal heart sounds.  Pulmonary:     Effort: Pulmonary effort is normal.     Breath sounds: Normal breath sounds.  Abdominal:     General: Bowel sounds are normal.  Palpations: Abdomen is soft.  Skin:    General: Skin is warm and dry.  Neurological:     Mental Status: He is alert and oriented to person, place, and time.     CMP Latest Ref Rng & Units 10/18/2021  Glucose 70 - 99 mg/dL 104(H)  BUN 8 - 23 mg/dL 14  Creatinine 0.61 - 1.24 mg/dL 0.85  Sodium 135 - 145 mmol/L 135  Potassium 3.5 - 5.1 mmol/L 3.7  Chloride 98 - 111 mmol/L 102  CO2 22 - 32 mmol/L 22  Calcium 8.9 - 10.3 mg/dL 9.2  Total Protein 6.5 - 8.1 g/dL 7.1  Total Bilirubin 0.3 - 1.2 mg/dL 0.6  Alkaline Phos 38 - 126 U/L 69  AST 15 - 41 U/L 19  ALT 0 - 44 U/L 11   CBC Latest Ref Rng & Units 10/18/2021  WBC 4.0 - 10.5 K/uL 6.1  Hemoglobin 13.0 - 17.0 g/dL 13.5  Hematocrit 39.0 - 52.0 % 40.2  Platelets 150 - 400 K/uL 234     Assessment and plan- Patient is a 71 y.o. 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  He is here for on treatment assessment prior to cycle 30 of palliative Keytruda  Counts  okay to proceed with cycle 30 of palliative Keytruda today.  Patient would like to stop treatment at this time.  He has had about 21 months of maintenance Keytruda with stable disease.  We will repeat CT soft tissue neck CT chest abdomen and pelvis with contrast in about 4 to 5 weeks time and I will see him thereafter.  We will continue to observe him off treatment as long as he does not have any evidence of disease recurrence   Visit Diagnosis 1. 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 Encounter for antineoplastic immunotherapy      Dr. Randa Evens, MD, MPH [facility] at [facility] [phone] 10/18/2021 12:55 PM

## 2021-10-18 NOTE — Patient Instructions (Signed)
[facility]CER CTR AT [place]  Discharge Instructions: Thank you for choosing [facility] to provide your oncology and hematology care.  If you have a lab appointment with the [facility], please go directly to the [facility] and check in at the registration area.  Wear comfortable clothing and clothing appropriate for easy access to any Portacath or PICC line.   We strive to give you quality time with your provider. You may need to reschedule your appointment if you arrive late (15 or more minutes).  Arriving late affects you and other patients whose appointments are after yours.  Also, if you miss three or more appointments without notifying the office, you may be dismissed from the clinic at the providers discretion.      For prescription refill requests, have your pharmacy contact our office and allow 72 hours for refills to be completed.    Today you received the following chemotherapy and/or immunotherapy agents : Keytruda     To help prevent nausea and vomiting after your treatment, we encourage you to take your nausea medication as directed.  BELOW ARE SYMPTOMS THAT SHOULD BE REPORTED IMMEDIATELY: *FEVER GREATER THAN 100.4 F (38 C) OR HIGHER *CHILLS OR SWEATING *NAUSEA AND VOMITING THAT IS NOT CONTROLLED WITH YOUR NAUSEA MEDICATION *UNUSUAL SHORTNESS OF BREATH *UNUSUAL BRUISING OR BLEEDING *URINARY PROBLEMS (pain or burning when urinating, or frequent urination) *BOWEL PROBLEMS (unusual diarrhea, constipation, pain near the anus) TENDERNESS IN MOUTH AND THROAT WITH OR WITHOUT PRESENCE OF ULCERS (sore throat, sores in mouth, or a toothache) UNUSUAL RASH, SWELLING OR PAIN  UNUSUAL VAGINAL DISCHARGE OR ITCHING   Items with * indicate a potential emergency and should be followed up as soon as possible or go to the Emergency Department if any problems should occur.  Please show the CHEMOTHERAPY ALERT CARD or IMMUNOTHERAPY ALERT CARD at check-in to  the Emergency Department and triage nurse.  Should you have questions after your visit or need to cancel or reschedule your appointment, please contact [facility] CANCER [place] AT [place]  [phone] and follow the prompts.  Office hours are 8:00 a.m. to 4:30 p.m. Monday - Friday. Please note that voicemails left after 4:00 p.m. may not be returned until the following business day.  We are closed weekends and major holidays. You have access to a nurse at all times for urgent questions. Please call the main number to the clinic [phone] and follow the prompts.  For any non-urgent questions, you may also contact your provider using MyChart. We now offer e-Visits for anyone 75 and older to request care online for non-urgent symptoms. For details visit [email or web address].   Also download the MyChart app! Go to the app store, search "MyChart", open the app, select [facility], and log in with your MyChart username and password.  Due to Covid, a mask is required upon entering the hospital/clinic. If you do not have a mask, one will be given to you upon arrival. For doctor visits, patients may have 1 support person aged 25 or older with them. For treatment visits, patients cannot have anyone with them due to current Covid guidelines and our immunocompromised population.

## 2021-11-14 ENCOUNTER — Other Ambulatory Visit: Payer: Self-pay | Admitting: *Deleted

## 2021-11-14 DIAGNOSIS — C099 Malignant neoplasm of tonsil, unspecified: Secondary | ICD-10-CM

## 2021-11-22 ENCOUNTER — Ambulatory Visit: Payer: Medicare Other

## 2021-11-22 ENCOUNTER — Ambulatory Visit
Admission: RE | Admit: 2021-11-22 | Discharge: 2021-11-22 | Disposition: A | Discharge status: Home / Self Care | Payer: Medicare Other | Source: Ambulatory Visit | Attending: Oncology | Admitting: Oncology

## 2021-11-22 ENCOUNTER — Other Ambulatory Visit: Payer: Self-pay

## 2021-11-22 DIAGNOSIS — N200 Calculus of kidney: Secondary | ICD-10-CM | POA: Insufficient documentation

## 2021-11-22 DIAGNOSIS — J351 Hypertrophy of tonsils: Secondary | ICD-10-CM | POA: Insufficient documentation

## 2021-11-22 DIAGNOSIS — C099 Malignant neoplasm of tonsil, unspecified: Secondary | ICD-10-CM | POA: Insufficient documentation

## 2021-11-22 DIAGNOSIS — Z85818 Personal history of malignant neoplasm of other sites of lip, oral cavity, and pharynx: Secondary | ICD-10-CM | POA: Diagnosis not present

## 2021-11-22 DIAGNOSIS — K5730 Diverticulosis of large intestine without perforation or abscess without bleeding: Secondary | ICD-10-CM | POA: Insufficient documentation

## 2021-11-22 MED ORDER — IOHEXOL 300 MG/ML  SOLN
100.0000 mL | Freq: Once | INTRAMUSCULAR | Status: AC | PRN
  Administered 2021-11-22: 100 mL via INTRAVENOUS

## 2021-11-25 ENCOUNTER — Inpatient Hospital Stay: Payer: Medicare Other | Admitting: Oncology

## 2021-11-25 ENCOUNTER — Other Ambulatory Visit: Payer: Self-pay

## 2021-11-25 ENCOUNTER — Inpatient Hospital Stay: Payer: Medicare Other | Attending: Oncology

## 2021-11-25 ENCOUNTER — Encounter: Payer: Self-pay | Admitting: Oncology

## 2021-11-25 VITALS — BP 123/85 | HR 66 | Temp 97.7°F | Resp 16 | Ht 70.0 in | Wt 192.9 lb

## 2021-11-25 DIAGNOSIS — C7800 Secondary malignant neoplasm of unspecified lung: Secondary | ICD-10-CM | POA: Diagnosis not present

## 2021-11-25 DIAGNOSIS — C099 Malignant neoplasm of tonsil, unspecified: Secondary | ICD-10-CM

## 2021-11-25 DIAGNOSIS — Z7189 Other specified counseling: Secondary | ICD-10-CM | POA: Diagnosis not present

## 2021-11-25 DIAGNOSIS — C771 Secondary and unspecified malignant neoplasm of intrathoracic lymph nodes: Secondary | ICD-10-CM | POA: Insufficient documentation

## 2021-11-25 LAB — CBC WITH DIFFERENTIAL/PLATELET
Abs Immature Granulocytes: 0.02 10*3/uL (ref 0.00–0.07)
Basophils Absolute: 0 10*3/uL (ref 0.0–0.1)
Basophils Relative: 1 %
Eosinophils Absolute: 0.5 10*3/uL (ref 0.0–0.5)
Eosinophils Relative: 7 %
HCT: 40.6 % (ref 39.0–52.0)
Hemoglobin: 13.4 g/dL (ref 13.0–17.0)
Immature Granulocytes: 0 %
Lymphocytes Relative: 18 %
Lymphs Abs: 1.1 10*3/uL (ref 0.7–4.0)
MCH: 31.6 pg (ref 26.0–34.0)
MCHC: 33 g/dL (ref 30.0–36.0)
MCV: 95.8 fL (ref 80.0–100.0)
Monocytes Absolute: 0.5 10*3/uL (ref 0.1–1.0)
Monocytes Relative: 9 %
Neutro Abs: 4 10*3/uL (ref 1.7–7.7)
Neutrophils Relative %: 65 %
Platelets: 230 10*3/uL (ref 150–400)
RBC: 4.24 MIL/uL (ref 4.22–5.81)
RDW: 13.4 % (ref 11.5–15.5)
WBC: 6.2 10*3/uL (ref 4.0–10.5)
nRBC: 0 % (ref 0.0–0.2)

## 2021-11-25 LAB — COMPREHENSIVE METABOLIC PANEL
ALT: 11 U/L (ref 0–44)
AST: 19 U/L (ref 15–41)
Albumin: 3.9 g/dL (ref 3.5–5.0)
Alkaline Phosphatase: 72 U/L (ref 38–126)
Anion gap: 7 (ref 5–15)
BUN: 15 mg/dL (ref 8–23)
CO2: 24 mmol/L (ref 22–32)
Calcium: 8.5 mg/dL — ABNORMAL LOW (ref 8.9–10.3)
Chloride: 104 mmol/L (ref 98–111)
Creatinine, Ser: 0.79 mg/dL (ref 0.61–1.24)
GFR, Estimated: >60 mL/min (ref >60)
Glucose, Bld: 99 mg/dL (ref 70–99)
Potassium: 3.9 mmol/L (ref 3.5–5.1)
Sodium: 135 mmol/L (ref 135–145)
Total Bilirubin: 0.7 mg/dL (ref 0.3–1.2)
Total Protein: 6.7 g/dL (ref 6.5–8.1)

## 2021-11-25 MED ORDER — SODIUM CHLORIDE 0.9% FLUSH
10.0000 mL | Freq: Once | INTRAVENOUS | Status: AC
  Administered 2021-11-25: 10 mL via INTRAVENOUS
  Filled 2021-11-25: qty 10

## 2021-11-25 MED ORDER — HEPARIN SOD (PORK) LOCK FLUSH 100 UNIT/ML IV SOLN
500.0000 [IU] | Freq: Once | INTRAVENOUS | Status: AC
  Administered 2021-11-25: 500 [IU] via INTRAVENOUS
  Filled 2021-11-25: qty 5

## 2021-11-25 NOTE — Progress Notes (Signed)
Still as back pain from injuries in the past. No concerns from cancer standpoint ?

## 2021-11-27 ENCOUNTER — Encounter: Payer: Self-pay | Admitting: Oncology

## 2021-11-27 NOTE — Progress Notes (Signed)
? ? ? ?Hematology/Oncology Consult note ?[place]  ?Telephone:([phone] [phone] ? ?Patient Care Team: ?Sindy Guadeloupe, MD as PCP - General (Oncology) ?Sindy Guadeloupe, MD as Consulting Physician (Oncology)  ? ?Name of the patient: Patrick Mendoza  ?[id number]  ?06/11/1951  ? ?Date of visit: 11/27/21 ? 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 ?Chief complaint/ Reason for visit-discuss CT scan results and further management ? ?Heme/Onc history: patient is a 71 year old Caucasian male who was referred to Dr. Richardson Landry for symptoms of sore throat and difficulty swallowing which she has been experiencing over the last 1 year.He underwent a comprehensive auto laryngoscopy exam which showed a firm enlarged right tonsillar mass with necrotic area extending onto the adjacent soft palate into the root of the uvula.  Hypopharynx and larynx as well as nasopharynx was unable to be visualized due to gag reflex.  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 ?  ?PET CT scan showed large hypermetabolic right palatine tonsil mass which crosses the midline.  Ipsilateral cervical nodal metastases.  Bilateral pulmonary nodules including a dominant 1 cm hypermetabolic right lung base nodule.  Multiplicity favor pulmonary metastases.  Subcarinal and equivocal left hilar nodal metastases.  Hypermetabolism involving the ascending colon.  Question mild diverticulitis versus underlying colonic mass or polyp cannot be excluded ?  ?  ?Patient underwent EBUS guided biopsy of hilar lymph nodes.  Biopsy showed squamous cell carcinoma.  However HPV/p16 status was uninterpretable.  ?  ?Patient completed 6 cycles of carbotaxol chemotherapy along with Keytruda and was on maintenance Keytruda until February 2023. ? ?Interval history-patient reports doing well and denies any  specific complaints at this time.  Denies any throat pain, sore throat, runny nose cough or shortness of breath. ? ?ECOG PS- 0 ?Pain scale- 0 ? ? ?Review of systems- Review of Systems  ?Constitutional:  Negative for chills, fever, malaise/fatigue and weight loss.  ?HENT:  Negative for congestion, ear discharge and nosebleeds.   ?Eyes:  Negative for blurred vision.  ?Respiratory:  Negative for cough, hemoptysis, sputum production, shortness of breath and wheezing.   ?Cardiovascular:  Negative for chest pain, palpitations, orthopnea and claudication.  ?Gastrointestinal:  Negative for abdominal pain, blood in stool, constipation, diarrhea, heartburn, melena, nausea and vomiting.  ?Genitourinary:  Negative for dysuria, flank pain, frequency, hematuria and urgency.  ?Musculoskeletal:  Negative for back pain, joint pain and myalgias.  ?Skin:  Negative for rash.  ?Neurological:  Negative for dizziness, tingling, focal weakness, seizures, weakness and headaches.  ?Endo/Heme/Allergies:  Does not bruise/bleed easily.  ?Psychiatric/Behavioral:  Negative for depression and suicidal ideas. The patient does not have insomnia.    ? ? ? ?No Known Allergies ? ? ?Past Medical History:  ?Diagnosis Date  ? Asthma   ? History of hiatal hernia   ? Inguinal hernia recurrent unilateral   ? Lung nodules   ? Metastasis to lung [facility])   ? tonsil cancer and mets to lung  ? Tonsil cancer ([place])   ? Tonsil cancer ([place])   ? ? ? ?Past Surgical History:  ?Procedure Laterality Date  ? CERVICAL SPINE SURGERY    ? LUMBAR SPINE SURGERY    ? PORTA CATH INSERTION N/A 10/08/2019  ? Procedure: PORTA CATH INSERTION;  Surgeon: Katha Cabal, MD;  Location: [place] [facility];  Service: Cardiovascular;  Laterality: N/A;  ?  VIDEO BRONCHOSCOPY WITH ENDOBRONCHIAL NAVIGATION N/A 10/10/2019  ? Procedure: VIDEO BRONCHOSCOPY WITH ENDOBRONCHIAL NAVIGATION;  Surgeon: Tyler Pita, MD;  Location: [facility] ORS;  Service: Pulmonary;  Laterality: N/A;  ? VIDEO  BRONCHOSCOPY WITH ENDOBRONCHIAL ULTRASOUND N/A 10/10/2019  ? Procedure: VIDEO BRONCHOSCOPY WITH ENDOBRONCHIAL ULTRASOUND;  Surgeon: Tyler Pita, MD;  Location: [facility] ORS;  Service: Pulmonary;  Laterality: N/A;  ? ? ?Social History  ? ?Socioeconomic History  ? Marital status: Widowed  ?  Spouse name: Not on file  ? Number of children: Not on file  ? Years of education: Not on file  ? Highest education level: Not on file  ?Occupational History  ? Not on file  ?Tobacco Use  ? Smoking status: Never  ? Smokeless tobacco: Never  ?Vaping Use  ? Vaping Use: Never used  ?Substance and Sexual Activity  ? Alcohol use: Never  ? Drug use: Never  ? Sexual activity: Not Currently  ?Other Topics Concern  ? Not on file  ?Social History Narrative  ? Not on file  ? ?Social Determinants of Health  ? ?Financial Resource Strain: Not on file  ?Food Insecurity: Not on file  ?Transportation Needs: Not on file  ?Physical Activity: Not on file  ?Stress: Not on file  ?Social Connections: Not on file  ?Intimate Partner Violence: Not on file  ? ? ?Family History  ?Problem Relation Age of Onset  ? Cancer Mother 33  ?     not sure what type  ? Prostate cancer Father   ? Rheum arthritis Father   ? Healthy Sister   ? Healthy Brother   ? Healthy Sister   ? Healthy Brother   ? Healthy Brother   ? Heart disease Brother   ? Heart disease Brother   ? ? ? ?Current Outpatient Medications:  ?  acetaminophen (TYLENOL) 500 MG tablet, Take 2,000 mg by mouth 3 (three) times daily as needed for moderate pain., Disp: , Rfl:  ?  budesonide-formoterol (SYMBICORT) 160-4.5 MCG/ACT inhaler, Inhale 2 puffs into the lungs 2 (two) times daily., Disp: 1 each, Rfl: 3 ?  EPINEPHrine (PRIMATENE MIST) 0.125 MG/ACT AERO, Inhale 1 puff into the lungs daily as needed (shortness of breath)., Disp: , Rfl:  ?  ibuprofen (ADVIL) 800 MG tablet, Take 1 tablet (800 mg total) by mouth every 8 (eight) hours as needed., Disp: 30 tablet, Rfl: 0 ?  Pembrolizumab (KEYTRUDA IV), Inject 1  Dose into the vein See admin instructions. Every 3rd Tuesday, Disp: , Rfl:  ?No current facility-administered medications for this visit. ? ?Facility-Administered Medications Ordered in Other Visits:  ?  sodium chloride flush (NS) 0.9 % injection 10 mL, 10 mL, Intravenous, PRN, Sindy Guadeloupe, MD, 10 mL at 03/09/20 0919 ? ?Physical exam:  ?Vitals:  ? 11/25/21 1001  ?BP: 123/85  ?Pulse: 66  ?Resp: 16  ?Temp: 97.7 ?F (36.5 ?C)  ?TempSrc: Oral  ?Weight: 192 lb 14.4 oz (87.5 kg)  ?Height: 5\' 10"  (1.778 m)  ? ?Physical Exam ?Constitutional:   ?   General: He is not in acute distress. ?Neck:  ?   Comments: Palpable level 2 cervical adenopathy ?Cardiovascular:  ?   Rate and Rhythm: Normal rate and regular rhythm.  ?   Heart sounds: Normal heart sounds.  ?Pulmonary:  ?   Effort: Pulmonary effort is normal.  ?   Breath sounds: Normal breath sounds.  ?Skin: ?   General: Skin is warm and dry.  ?Neurological:  ?   Mental  Status: He is alert and oriented to person, place, and time.  ?  ? ? ?  Latest Ref Rng & Units 11/25/2021  ?  9:31 AM  ?CMP  ?Glucose 70 - 99 mg/dL 99    ?BUN 8 - 23 mg/dL 15    ?Creatinine 0.61 - 1.24 mg/dL 0.79    ?Sodium 135 - 145 mmol/L 135    ?Potassium 3.5 - 5.1 mmol/L 3.9    ?Chloride 98 - 111 mmol/L 104    ?CO2 22 - 32 mmol/L 24    ?Calcium 8.9 - 10.3 mg/dL 8.5    ?Total Protein 6.5 - 8.1 g/dL 6.7    ?Total Bilirubin 0.3 - 1.2 mg/dL 0.7    ?Alkaline Phos 38 - 126 U/L 72    ?AST 15 - 41 U/L 19    ?ALT 0 - 44 U/L 11    ? ? ?  Latest Ref Rng & Units 11/25/2021  ?  9:31 AM  ?CBC  ?WBC 4.0 - 10.5 K/uL 6.2    ?Hemoglobin 13.0 - 17.0 g/dL 13.4    ?Hematocrit 39.0 - 52.0 % 40.6    ?Platelets 150 - 400 K/uL 230    ? ? ?No images are attached to the encounter. ? ?CT SOFT TISSUE NECK W CONTRAST ? ?Result Date: 11/23/2021 ?CLINICAL DATA:  71 year old male with a history of palatine tonsil squamous cell carcinoma diagnosed in 2021 with metastatic disease. Restaging. EXAM: CT NECK WITH CONTRAST TECHNIQUE: Multidetector  CT imaging of the neck was performed using the standard protocol following the bolus administration of intravenous contrast. RADIATION DOSE REDUCTION: This exam was performed according to the departmental

## 2022-01-19 ENCOUNTER — Ambulatory Visit
Admission: RE | Admit: 2022-01-19 | Discharge: 2022-01-19 | Disposition: A | Discharge status: Home / Self Care | Payer: Medicare Other | Source: Ambulatory Visit | Attending: Oncology | Admitting: Oncology

## 2022-01-19 DIAGNOSIS — R590 Localized enlarged lymph nodes: Secondary | ICD-10-CM | POA: Insufficient documentation

## 2022-01-19 DIAGNOSIS — C099 Malignant neoplasm of tonsil, unspecified: Secondary | ICD-10-CM | POA: Insufficient documentation

## 2022-01-19 MED ORDER — FLUDEOXYGLUCOSE F - 18 (FDG) INJECTION
9.9000 | Freq: Once | INTRAVENOUS | Status: AC | PRN
  Administered 2022-01-19: 10.44 via INTRAVENOUS

## 2022-01-20 LAB — GLUCOSE, CAPILLARY: Glucose-Capillary: 124 mg/dL — ABNORMAL HIGH (ref 70–99)

## 2022-01-24 ENCOUNTER — Inpatient Hospital Stay: Payer: Medicare Other

## 2022-01-24 ENCOUNTER — Inpatient Hospital Stay: Payer: Medicare Other | Admitting: Oncology

## 2022-01-24 ENCOUNTER — Inpatient Hospital Stay: Payer: Medicare Other | Attending: Oncology | Admitting: Oncology

## 2022-01-24 ENCOUNTER — Encounter: Payer: Self-pay | Admitting: Oncology

## 2022-01-24 VITALS — BP 114/76 | HR 73 | Temp 98.1°F | Resp 18 | Wt 187.0 lb

## 2022-01-24 DIAGNOSIS — Z452 Encounter for adjustment and management of vascular access device: Secondary | ICD-10-CM | POA: Diagnosis present

## 2022-01-24 DIAGNOSIS — C771 Secondary and unspecified malignant neoplasm of intrathoracic lymph nodes: Secondary | ICD-10-CM | POA: Insufficient documentation

## 2022-01-24 DIAGNOSIS — C7800 Secondary malignant neoplasm of unspecified lung: Secondary | ICD-10-CM | POA: Insufficient documentation

## 2022-01-24 DIAGNOSIS — C099 Malignant neoplasm of tonsil, unspecified: Secondary | ICD-10-CM | POA: Diagnosis present

## 2022-01-24 DIAGNOSIS — Z7189 Other specified counseling: Secondary | ICD-10-CM | POA: Diagnosis not present

## 2022-01-24 MED ORDER — SODIUM CHLORIDE 0.9% FLUSH
10.0000 mL | Freq: Once | INTRAVENOUS | Status: AC
  Administered 2022-01-24: 10 mL via INTRAVENOUS
  Filled 2022-01-24: qty 10

## 2022-01-24 MED ORDER — HEPARIN SOD (PORK) LOCK FLUSH 100 UNIT/ML IV SOLN
500.0000 [IU] | Freq: Once | INTRAVENOUS | Status: AC
  Administered 2022-01-24: 500 [IU] via INTRAVENOUS
  Filled 2022-01-24: qty 5

## 2022-01-28 ENCOUNTER — Encounter: Payer: Self-pay | Admitting: Oncology

## 2022-01-28 NOTE — Progress Notes (Signed)
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 Richardson Landry for symptoms of sore throat and difficulty swallowing which she has been experiencing over the last 1 year.He underwent a comprehensive auto laryngoscopy exam which showed a firm enlarged right tonsillar mass with necrotic area extending onto the adjacent soft palate into the root of the uvula.  Hypopharynx and larynx as well as nasopharynx was unable to be visualized due to gag reflex.  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   PET CT scan showed large hypermetabolic right palatine tonsil mass which crosses the midline.  Ipsilateral cervical nodal metastases.  Bilateral pulmonary nodules including a dominant 1 cm hypermetabolic right lung base nodule.  Multiplicity favor pulmonary metastases.  Subcarinal and equivocal left hilar nodal metastases.  Hypermetabolism involving the ascending colon.  Question mild diverticulitis versus underlying colonic mass or polyp cannot be excluded     Patient underwent EBUS guided biopsy of hilar lymph nodes.  Biopsy showed squamous cell carcinoma.  However HPV/p16 status was uninterpretable.    Patient completed 6 cycles of carbotaxol chemotherapy along with Keytruda and was on maintenance Keytruda until February 2023.    Interval history-patient is doing well and denies any specific complaints at  this time  ECOG PS- 0 Pain scale- 0 Opioid associated constipation- no  Review of systems- Review of Systems  Constitutional:  Negative for chills, fever, malaise/fatigue and weight loss.  HENT:  Negative for congestion, ear discharge and nosebleeds.   Eyes:  Negative for blurred vision.  Respiratory:  Negative for cough, hemoptysis, sputum production, shortness of breath and wheezing.   Cardiovascular:  Negative for chest pain, palpitations, orthopnea and claudication.  Gastrointestinal:  Negative for abdominal pain, blood in stool, constipation, diarrhea, heartburn, melena, nausea and vomiting.  Genitourinary:  Negative for dysuria, flank pain, frequency, hematuria and urgency.  Musculoskeletal:  Negative for back pain, joint pain and myalgias.  Skin:  Negative for rash.  Neurological:  Negative for dizziness, tingling, focal weakness, seizures, weakness and headaches.  Endo/Heme/Allergies:  Does not bruise/bleed easily.  Psychiatric/Behavioral:  Negative for depression and suicidal ideas. The patient does not have insomnia.       No Known Allergies   Past Medical History:  Diagnosis Date   Asthma    History of hiatal hernia    Inguinal hernia recurrent unilateral    Lung nodules    Metastasis to lung (HCC)    tonsil cancer and mets to lung   Tonsil cancer ([place])    Tonsil cancer [facility])      Past Surgical History:  Procedure Laterality Date   CERVICAL SPINE SURGERY     LUMBAR SPINE SURGERY     PORTA CATH INSERTION N/A 10/08/2019   Procedure: PORTA CATH INSERTION;  Surgeon: Katha Cabal, MD;  Location: [place] [facility];  Service: Cardiovascular;  Laterality: N/A;   VIDEO BRONCHOSCOPY WITH ENDOBRONCHIAL NAVIGATION N/A 10/10/2019   Procedure:  VIDEO BRONCHOSCOPY WITH ENDOBRONCHIAL NAVIGATION;  Surgeon: Tyler Pita, MD;  Location: [facility];  Service: Pulmonary;  Laterality: N/A;   VIDEO BRONCHOSCOPY WITH ENDOBRONCHIAL ULTRASOUND N/A 10/10/2019   Procedure:  VIDEO BRONCHOSCOPY WITH ENDOBRONCHIAL ULTRASOUND;  Surgeon: Tyler Pita, MD;  Location: [facility];  Service: Pulmonary;  Laterality: N/A;    Social History   Socioeconomic History   Marital status: Widowed    Spouse name: Not on file   Number of children: Not on file   Years of education: Not on file   Highest education level: Not on file  Occupational History   Not on file  Tobacco Use   Smoking status: Never   Smokeless tobacco: Never  Vaping Use   Vaping Use: Never used  Substance and Sexual Activity   Alcohol use: Never   Drug use: Never   Sexual activity: Not Currently  Other Topics Concern   Not on file  Social History Narrative   Not on file   Social Determinants of Health   Financial Resource Strain: Not on file  Food Insecurity: Not on file  Transportation Needs: Not on file  Physical Activity: Not on file  Stress: Not on file  Social Connections: Not on file  Intimate Partner Violence: Not on file    Family History  Problem Relation Age of Onset   Cancer Mother 32       not sure what type   Prostate cancer Father    Rheum arthritis Father    Healthy Sister    Healthy Brother    Healthy Sister    Healthy Brother    Healthy Brother    Heart disease Brother    Heart disease Brother      Current Outpatient Medications:    acetaminophen (TYLENOL) 500 MG tablet, Take 2,000 mg by mouth 3 (three) times daily as needed for moderate pain., Disp: , Rfl:    budesonide-formoterol (SYMBICORT) 160-4.5 MCG/ACT inhaler, Inhale 2 puffs into the lungs 2 (two) times daily., Disp: 1 each, Rfl: 3   ibuprofen (ADVIL) 800 MG tablet, Take 1 tablet (800 mg total) by mouth every 8 (eight) hours as needed., Disp: 30 tablet, Rfl: 0   EPINEPHrine (PRIMATENE MIST) 0.125 MG/ACT AERO, Inhale 1 puff into the lungs daily as needed (shortness of breath)., Disp: , Rfl:    Pembrolizumab (KEYTRUDA IV), Inject 1 Dose into the vein See admin instructions. Every 3rd Tuesday (Patient  not taking: Reported on 01/24/2022), Disp: , Rfl:  No current facility-administered medications for this visit.  Facility-Administered Medications Ordered in Other Visits:    sodium chloride flush (NS) 0.9 % injection 10 mL, 10 mL, Intravenous, PRN, Sindy Guadeloupe, MD, 10 mL at 03/09/20 0919  Physical exam:  Vitals:   01/24/22 1356  BP: 114/76  Pulse: 73  Resp: 18  Temp: 98.1 F (36.7 C)  SpO2: 100%  Weight: 187 lb (84.8 kg)   Physical Exam Constitutional:      General: He is not in acute distress. Cardiovascular:     Rate and Rhythm: Normal rate and regular rhythm.     Heart sounds: Normal heart sounds.  Pulmonary:     Effort: Pulmonary effort is normal.     Breath sounds: Normal breath sounds.  Lymphadenopathy:     Comments: Palpable level 2 right cervical lymph node  Skin:    General: Skin is warm and dry.  Neurological:     Mental Status: He is alert and oriented to person, place,  and time.        Latest Ref Rng & Units 11/25/2021    9:31 AM  CMP  Glucose 70 - 99 mg/dL 99    BUN 8 - 23 mg/dL 15    Creatinine 0.61 - 1.24 mg/dL 0.79    Sodium 135 - 145 mmol/L 135    Potassium 3.5 - 5.1 mmol/L 3.9    Chloride 98 - 111 mmol/L 104    CO2 22 - 32 mmol/L 24    Calcium 8.9 - 10.3 mg/dL 8.5    Total Protein 6.5 - 8.1 g/dL 6.7    Total Bilirubin 0.3 - 1.2 mg/dL 0.7    Alkaline Phos 38 - 126 U/L 72    AST 15 - 41 U/L 19    ALT 0 - 44 U/L 11        Latest Ref Rng & Units 11/25/2021    9:31 AM  CBC  WBC 4.0 - 10.5 K/uL 6.2    Hemoglobin 13.0 - 17.0 g/dL 13.4    Hematocrit 39.0 - 52.0 % 40.6    Platelets 150 - 400 K/uL 230      No images are attached to the encounter.  NM PET Image Restag (PS) Skull Base To Thigh  Result Date: 01/21/2022 CLINICAL DATA:  Subsequent treatment strategy for squamous cell carcinoma of the right tonsil. EXAM: NUCLEAR MEDICINE PET SKULL BASE TO THIGH TECHNIQUE: 10.44 mCi F-18 FDG was injected intravenously. Full-ring PET imaging was  performed from the skull base to thigh after the radiotracer. CT data was obtained and used for attenuation correction and anatomic localization. Fasting blood glucose: 124 mg/dl COMPARISON:  PET-CT 09/30/2019, CT neck, chest, abdomen, and pelvis 11/22/2021 FINDINGS: Mediastinal blood pool activity: SUV max 1.72 Liver activity: SUV max NA NECK: There is intense, asymmetric increased uptake localizing to the recently characterized heterogeneously enlarged right palatine tonsil. The SUV max is equal to 9.76, image 38/2. The right retropharyngeal lymph node is tracer avid measuring 1.1 cm within SUV max of 6.75, image 33/2. Tracer avid right level 2 lymph node measures 1.8 cm within SUV max of 6.22, image 46/2. Incidental CT findings: none CHEST: No hypermetabolic mediastinal or hilar nodes. No suspicious pulmonary nodules on the CT scan. Incidental CT findings: Aortic atherosclerosis and coronary artery calcifications. ABDOMEN/PELVIS: No abnormal hypermetabolic activity within the liver, pancreas, adrenal glands, or spleen. No hypermetabolic lymph nodes in the abdomen or pelvis. Incidental CT findings: Left kidney stone measures 7 mm. Right kidney stone measures 4 mm. Benign Bosniak class 1 cyst arises off the anterior cortex of the right kidney measuring 4 cm, image 168/2. No follow-up recommended. SKELETON: No focal hypermetabolic activity to suggest skeletal metastasis. Incidental CT findings: Arthro pathic uptake identified within the right shoulder. IMPRESSION: 1. Examination is positive for tracer avid recurrent tumor within the neck. Increased radiotracer uptake localizes to the recently characterized heterogeneously enlarged right palatine tonsil compatible with recurrence of disease. 2. Previously characterized enlarged right retropharyngeal lymph node and right level 2 lymph node are tracer avid compatible with metastatic disease. 3. No signs tracer avid metastatic disease within the chest, abdomen or  pelvis. Electronically Signed   By: Kerby Moors M.D.   On: 01/21/2022 12:12     Assessment and plan- Patient is a 71 y.o. 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  Patient completed 6 cycles of CarboTaxol Keytruda and was on maintenance Keytruda until February 2023.  He is here to discuss PET CT scan results and further management  I have reviewed PET CT scan images independently and discussed findings with the patient in detail.Patient first started CarboTaxol chemotherapy in February 2021 and overall had stable disease on maintenance Keytruda.  CT scans as well as PET scan shows disease recurrence in the right oropharynx as well as right retropharyngeal lymph node and level 2 cervical node.  Previously patient had biopsy-proven mediastinal lymph nodes which were positive for malignancy but presently we are not seeing any evidence of distant metastatic disease.  Given that he has locoregional recurrence I would recommend proceeding with definitive concurrent chemoradiation with weekly cisplatin.  Discussed radiation treatment would typically be for [redacted] weeks along with 7 weekly doses of cisplatin at 40 mg per metered square.  Discussed risk of benefits of chemotherapy including all but not limited to nausea vomiting low blood counts, risk of infections and hospitalization.  Risk of hearing loss kidney injury and peripheral neuropathy associated with cisplatin.    Patient states that he would like to defer treatment at least for the next 3 to 4 months.  He is currently a truck driver by profession and wants to continue taking up continue his assignments and not be bound to [place] to receive treatments.  Patient clearly understands that if he waits for the next 3 months this could lead to further progression of disease and potentially distant metastatic disease and definitive concurrent chemoradiation will not be an option at  that time.  I will see him back in 3 months time with CT soft tissue neck CT chest abdomen and pelvis with contrast prior.  I will potentially consider a repeat biopsy at that time   Visit Diagnosis 1. 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 Goals of care, counseling/discussion      Dr. Randa Evens, MD, MPH [facility] [phone] 01/28/2022 9:17 AM

## 2022-03-08 IMAGING — CT CT NECK W/ CM
5 series · 15 of 33 positions shown, 17 images · IV contrast (omnipaque)
Comparison: 07/02/2020

CLINICAL DATA: Tonsil carcinoma post chemotherapy, follow-up

EXAM:
CT NECK WITH CONTRAST
TECHNIQUE: Multidetector CT imaging of the neck was performed using the
standard protocol following the bolus administration of intravenous
contrast.
CONTRAST:  100mL OMNIPAQUE IOHEXOL 300 MG/ML  SOLN

[Series 3: axials neck 2.00 · axial · 0.67mm/px · z∈[-677,-597]mm · 2 of 121 slices shown]
[im 41/121  bone]
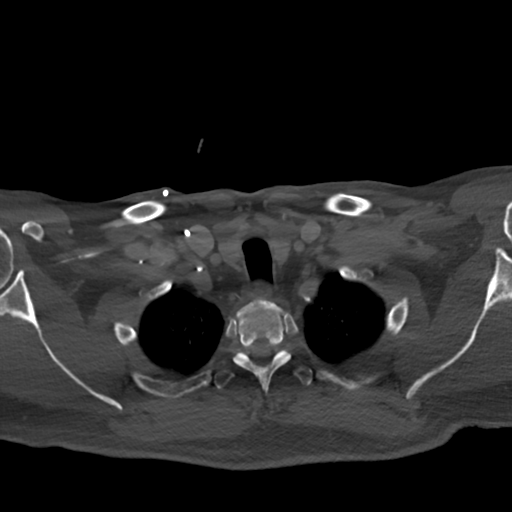
[im 81/121  bone]
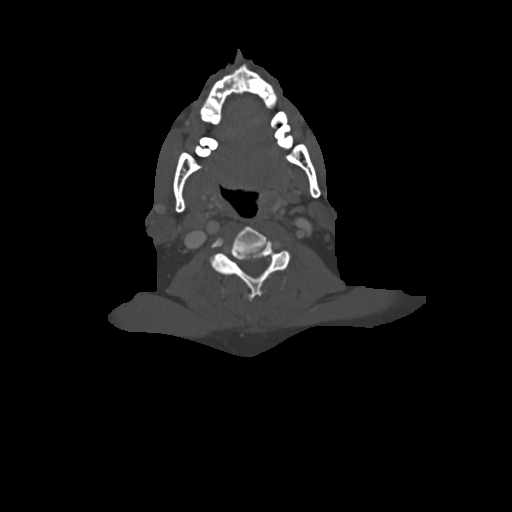

[Series 4: bone windows neck 2.00 · axial · 0.67mm/px · 1 of 121 slices shown]
[im 41/121  bone]
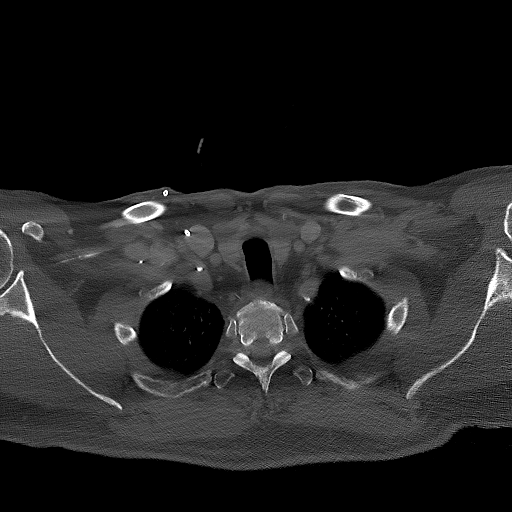

[Series 5: coronals neck 2.00 cor · coronal · 0.57mm/px · 3 of 139 slices shown]
[im 43/139  bone]
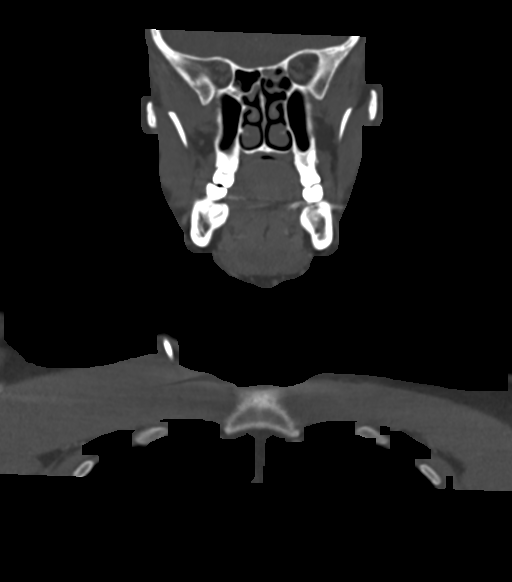
[im 61/139  bone]
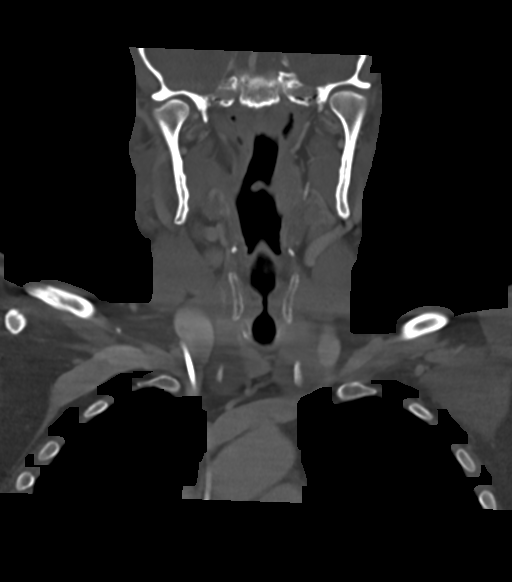
[im 78/139  bone]
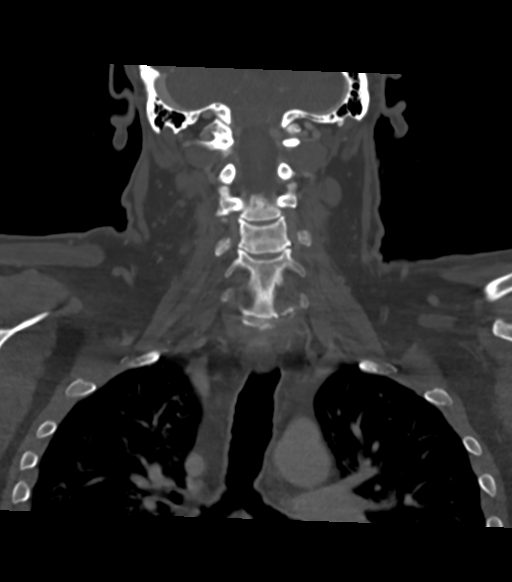

[Series 7: sagittals neck 2.00 sag · sagittal · 0.55mm/px · 5 of 145 slices shown, 6 images]
[im 49/145  bone]
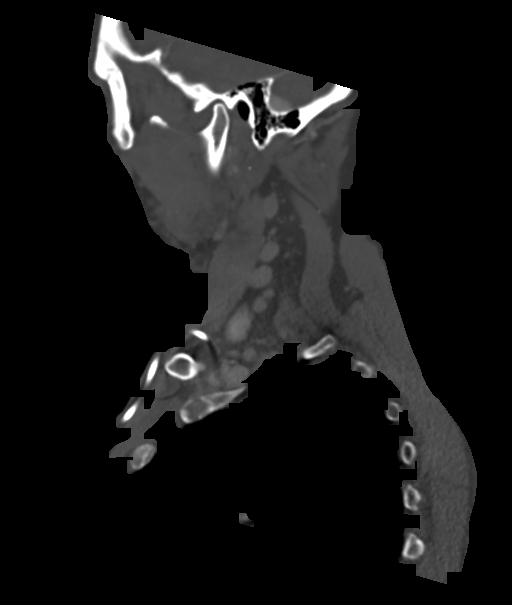
[im 61/145  bone]
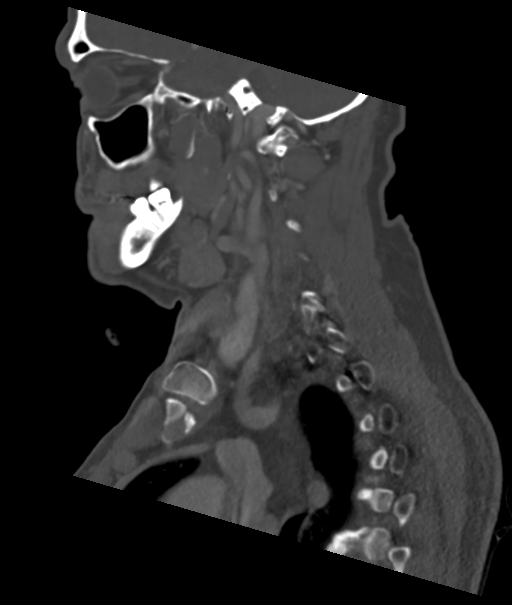
[im 73/145  soft-tissue]
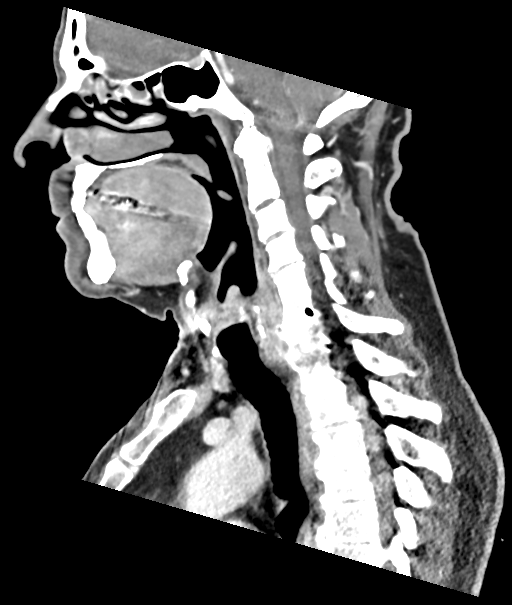
[im 73/145  bone]
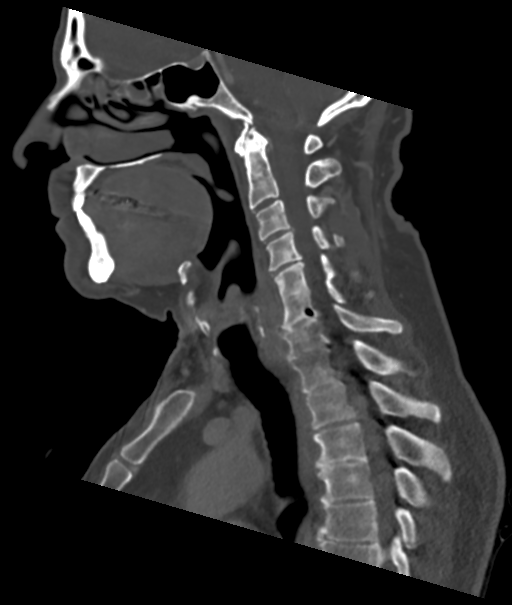
[im 85/145  bone]
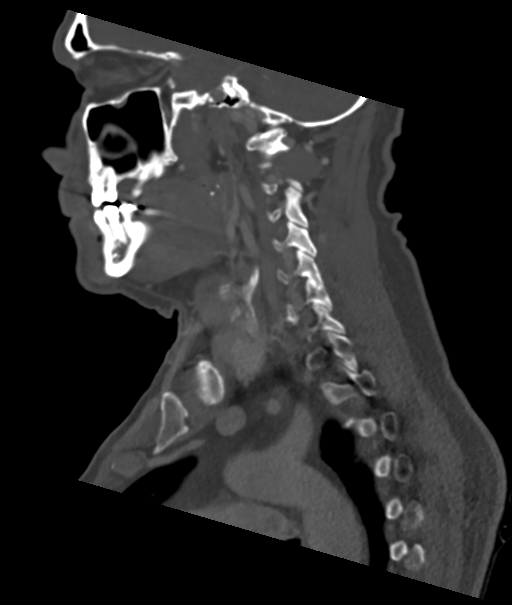
[im 97/145  bone]
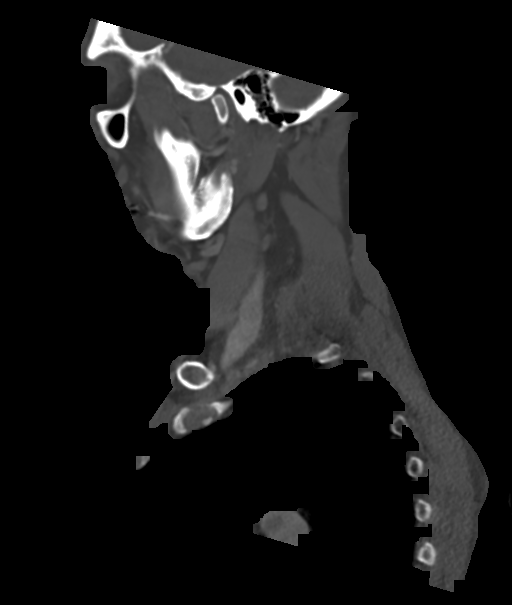

[Series 9: ax oropharynx neck 2.00 ax · axial · 0.55mm/px · z∈[-780,-591]mm · 4 of 165 slices shown, 5 images]
[im 33/165  soft-tissue]
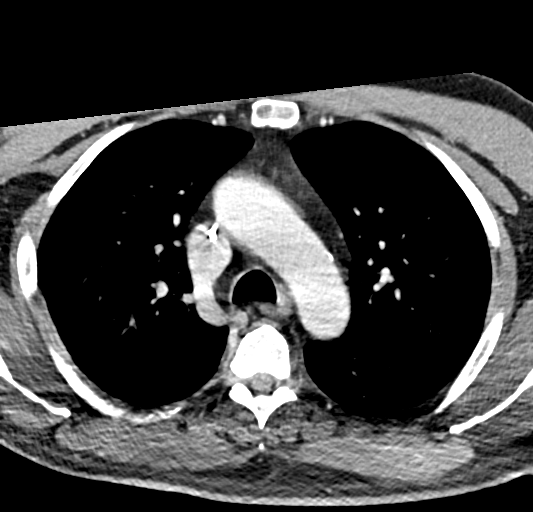
[im 33/165  bone]
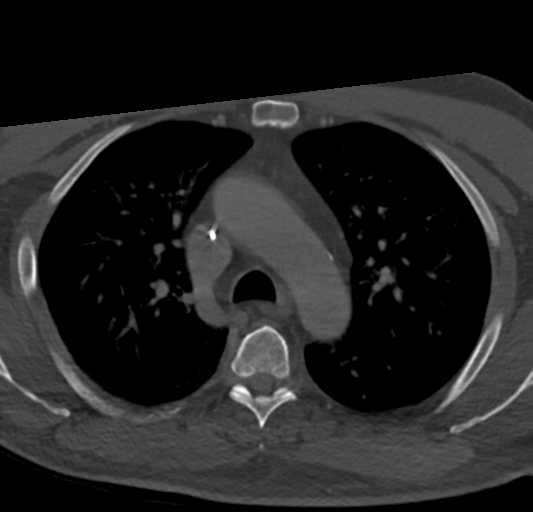
[im 66/165  bone]
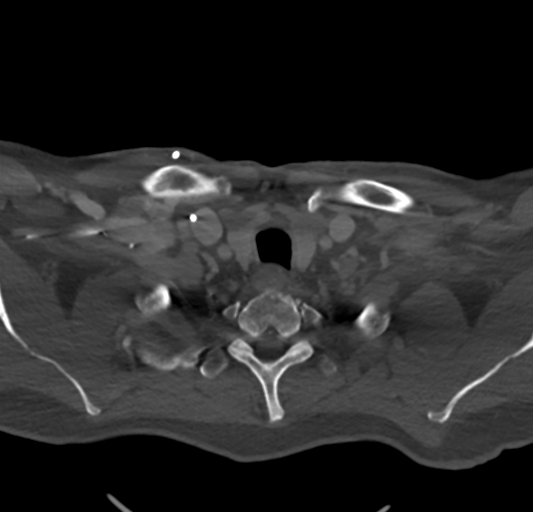
[im 99/165  bone]
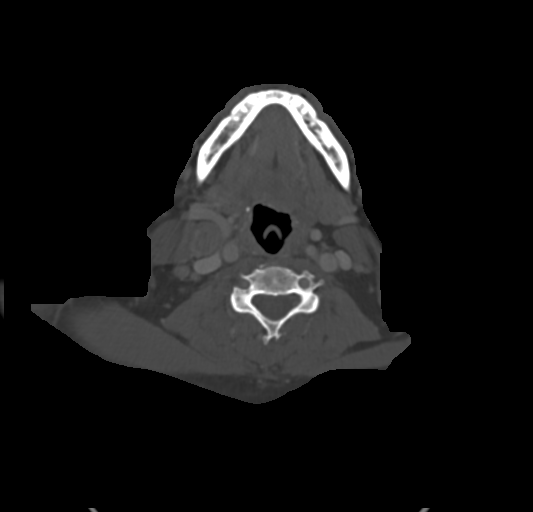
[im 132/165  bone]
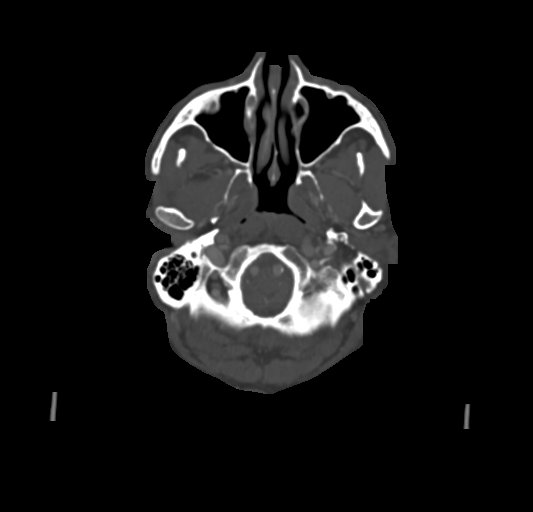

[15 of 33 positions shown; findings below may reference images not displayed]

FINDINGS: Pharynx and larynx: There is no evidence of recurrent right
tonsillar mass. Stable appearance of left palatine tonsil with
likely postinflammatory calcifications. Remainder of pharynx and
larynx is unremarkable.

Salivary glands: Unremarkable.

Thyroid: Normal

Lymph nodes: Asymmetric enlarged and nonenlarged primarily right
level 2 lymph nodes. Increase in size of right level 2 node
measuring up to 2 cm (previously 1.7 cm) on series 3, image 45.
Decrease in size of right level 2 node measuring 8 mm on image 50
(previously 11 mm). Increase in size of more anterior node at the
same level measuring 10 mm (previously 4 mm).

Vascular: Major neck vessels are patent.

Limited intracranial: No abnormal enhancement.

Visualized orbits: Unremarkable.

Mastoids and visualized paranasal sinuses: Patchy mucosal
thickening. Mastoid air cells are clear.

Skeleton: Degenerative changes of the cervical spine appears
similar.

Upper chest: Refer to separately dictated CT chest.

Other: None.
IMPRESSION: No evidence of local recurrence. Increased right level 2 adenopathy.

## 2022-03-20 ENCOUNTER — Other Ambulatory Visit: Payer: Self-pay

## 2022-03-27 ENCOUNTER — Other Ambulatory Visit: Payer: Self-pay

## 2022-04-03 NOTE — Progress Notes (Signed)
error 

## 2022-04-19 ENCOUNTER — Inpatient Hospital Stay: Admission: RE | Admit: 2022-04-19 | Payer: Medicare Other | Source: Ambulatory Visit

## 2022-04-19 ENCOUNTER — Ambulatory Visit
Admission: RE | Admit: 2022-04-19 | Discharge: 2022-04-19 | Disposition: A | Discharge status: Home / Self Care | Payer: Medicare Other | Source: Ambulatory Visit | Attending: Oncology | Admitting: Oncology

## 2022-04-19 DIAGNOSIS — K769 Liver disease, unspecified: Secondary | ICD-10-CM | POA: Insufficient documentation

## 2022-04-19 DIAGNOSIS — C099 Malignant neoplasm of tonsil, unspecified: Secondary | ICD-10-CM | POA: Insufficient documentation

## 2022-04-19 DIAGNOSIS — R599 Enlarged lymph nodes, unspecified: Secondary | ICD-10-CM | POA: Diagnosis not present

## 2022-04-19 DIAGNOSIS — Z9221 Personal history of antineoplastic chemotherapy: Secondary | ICD-10-CM | POA: Diagnosis not present

## 2022-04-19 DIAGNOSIS — C109 Malignant neoplasm of oropharynx, unspecified: Secondary | ICD-10-CM | POA: Diagnosis not present

## 2022-04-19 DIAGNOSIS — C7800 Secondary malignant neoplasm of unspecified lung: Secondary | ICD-10-CM | POA: Diagnosis not present

## 2022-04-19 DIAGNOSIS — R918 Other nonspecific abnormal finding of lung field: Secondary | ICD-10-CM | POA: Diagnosis not present

## 2022-04-19 DIAGNOSIS — Z981 Arthrodesis status: Secondary | ICD-10-CM | POA: Diagnosis not present

## 2022-04-19 DIAGNOSIS — Z9889 Other specified postprocedural states: Secondary | ICD-10-CM | POA: Diagnosis not present

## 2022-04-19 LAB — POCT I-STAT CREATININE: Creatinine, Ser: 0.8 mg/dL (ref 0.61–1.24)

## 2022-04-19 MED ORDER — IOHEXOL 300 MG/ML  SOLN
100.0000 mL | Freq: Once | INTRAMUSCULAR | Status: AC | PRN
  Administered 2022-04-19: 100 mL via INTRAVENOUS

## 2022-04-20 ENCOUNTER — Other Ambulatory Visit: Payer: Self-pay | Admitting: Oncology

## 2022-04-20 ENCOUNTER — Telehealth: Payer: Self-pay | Admitting: *Deleted

## 2022-04-20 NOTE — Telephone Encounter (Signed)
Called report  IMPRESSION: Chest Impression:   1. New bilateral small round pulmonary nodules consistent with pulmonary metastasis. 2. No mediastinal adenopathy.   Abdomen / Pelvis Impression:   1. New large round lesion RIGHT hepatic lobe and smaller lesion in the LEFT hepatic lobe are consistent with hepatic metastasis. 2. No metastatic adenopathy in the abdomen pelvis. 3. No skeletal metastasis.   These results will be called to the ordering clinician or representative by the Radiologist Assistant, and communication documented in the PACS or [employer].     Electronically Signed   By: Suzy Bouchard M.D.   On: 04/20/2022 11:24

## 2022-04-21 ENCOUNTER — Other Ambulatory Visit: Payer: Self-pay

## 2022-04-25 ENCOUNTER — Inpatient Hospital Stay: Payer: Medicare Other | Attending: Oncology

## 2022-04-25 ENCOUNTER — Inpatient Hospital Stay: Payer: Medicare Other | Admitting: Oncology

## 2022-04-25 ENCOUNTER — Encounter: Payer: Self-pay | Admitting: Oncology

## 2022-04-25 VITALS — BP 117/74 | HR 68 | Temp 97.9°F | Resp 20 | Wt 187.0 lb

## 2022-04-25 DIAGNOSIS — Z95828 Presence of other vascular implants and grafts: Secondary | ICD-10-CM

## 2022-04-25 DIAGNOSIS — C771 Secondary and unspecified malignant neoplasm of intrathoracic lymph nodes: Secondary | ICD-10-CM | POA: Diagnosis not present

## 2022-04-25 DIAGNOSIS — R932 Abnormal findings on diagnostic imaging of liver and biliary tract: Secondary | ICD-10-CM

## 2022-04-25 DIAGNOSIS — C787 Secondary malignant neoplasm of liver and intrahepatic bile duct: Secondary | ICD-10-CM | POA: Diagnosis not present

## 2022-04-25 DIAGNOSIS — Z8042 Family history of malignant neoplasm of prostate: Secondary | ICD-10-CM | POA: Diagnosis not present

## 2022-04-25 DIAGNOSIS — C7800 Secondary malignant neoplasm of unspecified lung: Secondary | ICD-10-CM | POA: Insufficient documentation

## 2022-04-25 DIAGNOSIS — R9389 Abnormal findings on diagnostic imaging of other specified body structures: Secondary | ICD-10-CM | POA: Diagnosis not present

## 2022-04-25 DIAGNOSIS — C099 Malignant neoplasm of tonsil, unspecified: Secondary | ICD-10-CM | POA: Insufficient documentation

## 2022-04-25 LAB — COMPREHENSIVE METABOLIC PANEL
ALT: 12 U/L (ref 0–44)
AST: 24 U/L (ref 15–41)
Albumin: 3.8 g/dL (ref 3.5–5.0)
Alkaline Phosphatase: 85 U/L (ref 38–126)
Anion gap: 5 (ref 5–15)
BUN: 15 mg/dL (ref 8–23)
CO2: 25 mmol/L (ref 22–32)
Calcium: 8.7 mg/dL — ABNORMAL LOW (ref 8.9–10.3)
Chloride: 107 mmol/L (ref 98–111)
Creatinine, Ser: 0.9 mg/dL (ref 0.61–1.24)
GFR, Estimated: >60 mL/min (ref >60)
Glucose, Bld: 98 mg/dL (ref 70–99)
Potassium: 3.8 mmol/L (ref 3.5–5.1)
Sodium: 137 mmol/L (ref 135–145)
Total Bilirubin: 0.5 mg/dL (ref 0.3–1.2)
Total Protein: 7.2 g/dL (ref 6.5–8.1)

## 2022-04-25 LAB — CBC WITH DIFFERENTIAL/PLATELET
Abs Immature Granulocytes: 0.02 10*3/uL (ref 0.00–0.07)
Basophils Absolute: 0 10*3/uL (ref 0.0–0.1)
Basophils Relative: 1 %
Eosinophils Absolute: 0.3 10*3/uL (ref 0.0–0.5)
Eosinophils Relative: 5 %
HCT: 39.1 % (ref 39.0–52.0)
Hemoglobin: 13.2 g/dL (ref 13.0–17.0)
Immature Granulocytes: 0 %
Lymphocytes Relative: 21 %
Lymphs Abs: 1.2 10*3/uL (ref 0.7–4.0)
MCH: 32.8 pg (ref 26.0–34.0)
MCHC: 33.8 g/dL (ref 30.0–36.0)
MCV: 97.3 fL (ref 80.0–100.0)
Monocytes Absolute: 0.5 10*3/uL (ref 0.1–1.0)
Monocytes Relative: 9 %
Neutro Abs: 3.6 10*3/uL (ref 1.7–7.7)
Neutrophils Relative %: 64 %
Platelets: 266 10*3/uL (ref 150–400)
RBC: 4.02 MIL/uL — ABNORMAL LOW (ref 4.22–5.81)
RDW: 12.5 % (ref 11.5–15.5)
WBC: 5.7 10*3/uL (ref 4.0–10.5)
nRBC: 0 % (ref 0.0–0.2)

## 2022-04-25 MED ORDER — SODIUM CHLORIDE 0.9% FLUSH
10.0000 mL | Freq: Once | INTRAVENOUS | Status: AC
  Administered 2022-04-25: 10 mL via INTRAVENOUS
  Filled 2022-04-25: qty 10

## 2022-04-25 MED ORDER — HEPARIN SOD (PORK) LOCK FLUSH 100 UNIT/ML IV SOLN
500.0000 [IU] | Freq: Once | INTRAVENOUS | Status: AC
  Administered 2022-04-25: 500 [IU] via INTRAVENOUS
  Filled 2022-04-25: qty 5

## 2022-04-26 ENCOUNTER — Other Ambulatory Visit: Payer: Self-pay

## 2022-04-27 ENCOUNTER — Encounter: Payer: Self-pay | Admitting: Oncology

## 2022-04-27 ENCOUNTER — Other Ambulatory Visit: Payer: Medicare Other

## 2022-04-27 NOTE — Progress Notes (Signed)
Tumor Board Documentation  Patrick Mendoza. was presented by Dr Janese Banks at our Tumor Board on 04/27/2022, which included representatives from medical oncology, surgical, pharmacy, pulmonology, genetics, radiology, pathology, research, navigation, radiation oncology, internal medicine, palliative care.  [name] currently presents as a current patient, for discussion with history of the following treatments: neoadjuvant chemotherapy, active survellience.  Additionally, we reviewed previous medical and familial history, history of present illness, and recent lab results along with all available histopathologic and imaging studies. The tumor board considered available treatment options and made the following recommendations: Palliative radiation therapy, Chemotherapy (Patient is refusing any treatment) There is concern for his airway becoming obstructed and Dr Janese Banks will discuss possible XRT therapy  The following procedures/referrals were also placed: No orders of the defined types were placed in this encounter.   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  Tumor board is a meeting of clinicians from various specialty areas who evaluate and discuss patients for whom a multidisciplinary approach is being considered. Final determinations in the plan of care are those of the provider(s). The responsibility for follow up of recommendations given during tumor board is that of the provider.   Today's extended care, comprehensive team conference, Patrick Mendoza was not present for the discussion and was not examined.   Multidisciplinary Tumor Board is a multidisciplinary case peer review process.  Decisions discussed in the Multidisciplinary Tumor Board reflect the opinions of the specialists  present at the conference without having examined the patient.  Ultimately, treatment and diagnostic decisions rest with the primary provider(s) and the patient.

## 2022-04-27 NOTE — Progress Notes (Signed)
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 Richardson Landry for symptoms of sore throat and difficulty swallowing which she has been experiencing over the last 1 year.He underwent a comprehensive auto laryngoscopy exam which showed a firm enlarged right tonsillar mass with necrotic area extending onto the adjacent soft palate into the root of the uvula.  Hypopharynx and larynx as well as nasopharynx was unable to be visualized due to gag reflex.  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   PET CT scan showed large hypermetabolic right palatine tonsil mass which crosses the midline.  Ipsilateral cervical nodal metastases.  Bilateral pulmonary nodules including a dominant 1 cm hypermetabolic right lung base nodule.  Multiplicity favor pulmonary metastases.  Subcarinal and equivocal left hilar nodal metastases.  Hypermetabolism involving the ascending colon.  Question mild diverticulitis versus underlying colonic mass or polyp cannot be excluded     Patient underwent EBUS guided biopsy of hilar lymph nodes.  Biopsy showed squamous cell carcinoma.  However HPV/p16 status was uninterpretable.    Patient completed 6 cycles of carbotaxol chemotherapy along with Keytruda and was on maintenance Keytruda until February 2023.  Interval history-patient reports doing well and denies any  specific complaints at this time.  Reports no difficulty swallowing.  States that his appetite and weight have remained stable.  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  He anticipates that he will continue to be busy over the next 3 months before it slows down  ECOG PS- 0 Pain scale- 0   Review of systems- Review of Systems  Constitutional:  Negative for chills, fever, malaise/fatigue and weight loss.  HENT:  Negative for congestion, ear discharge and nosebleeds.   Eyes:  Negative for blurred vision.  Respiratory:  Negative for cough, hemoptysis, sputum production, shortness of breath and wheezing.   Cardiovascular:  Negative for chest pain, palpitations, orthopnea and claudication.  Gastrointestinal:  Negative for abdominal pain, blood in stool, constipation, diarrhea, heartburn, melena, nausea and vomiting.  Genitourinary:  Negative for dysuria, flank pain, frequency, hematuria and urgency.  Musculoskeletal:  Negative for back pain, joint pain and myalgias.  Skin:  Negative for rash.  Neurological:  Negative for dizziness, tingling, focal weakness, seizures, weakness and headaches.  Endo/Heme/Allergies:  Does not bruise/bleed easily.  Psychiatric/Behavioral:  Negative for depression and suicidal ideas. The patient does not have insomnia.       No Known Allergies   Past Medical History:  Diagnosis Date   Asthma    History of hiatal hernia    Inguinal hernia recurrent unilateral    Lung nodules    Metastasis to lung (HCC)    tonsil cancer and mets to lung   Tonsil cancer ([place])    Tonsil cancer ([place])      Past Surgical History:  Procedure Laterality Date   CERVICAL SPINE SURGERY     LUMBAR SPINE SURGERY  PORTA CATH INSERTION N/A 10/08/2019   Procedure: PORTA CATH INSERTION;  Surgeon: Katha Cabal, MD;  Location: [facility];  Service: Cardiovascular;  Laterality: N/A;   VIDEO BRONCHOSCOPY WITH ENDOBRONCHIAL NAVIGATION N/A 10/10/2019    Procedure: VIDEO BRONCHOSCOPY WITH ENDOBRONCHIAL NAVIGATION;  Surgeon: Tyler Pita, MD;  Location: [facility];  Service: Pulmonary;  Laterality: N/A;   VIDEO BRONCHOSCOPY WITH ENDOBRONCHIAL ULTRASOUND N/A 10/10/2019   Procedure: VIDEO BRONCHOSCOPY WITH ENDOBRONCHIAL ULTRASOUND;  Surgeon: Tyler Pita, MD;  Location: [facility];  Service: Pulmonary;  Laterality: N/A;    Social History   Socioeconomic History   Marital status: Widowed    Spouse name: Not on file   Number of children: Not on file   Years of education: Not on file   Highest education level: Not on file  Occupational History   Not on file  Tobacco Use   Smoking status: Never   Smokeless tobacco: Never  Vaping Use   Vaping Use: Never used  Substance and Sexual Activity   Alcohol use: Never   Drug use: Never   Sexual activity: Not Currently  Other Topics Concern   Not on file  Social History Narrative   Not on file   Social Determinants of Health   Financial Resource Strain: Not on file  Food Insecurity: Not on file  Transportation Needs: Not on file  Physical Activity: Not on file  Stress: Not on file  Social Connections: Not on file  Intimate Partner Violence: Not on file    Family History  Problem Relation Age of Onset   Cancer Mother 61       not sure what type   Prostate cancer Father    Rheum arthritis Father    Healthy Sister    Healthy Brother    Healthy Sister    Healthy Brother    Healthy Brother    Heart disease Brother    Heart disease Brother      Current Outpatient Medications:    acetaminophen (TYLENOL) 500 MG tablet, Take 2,000 mg by mouth 3 (three) times daily as needed for moderate pain., Disp: , Rfl:    budesonide-formoterol (SYMBICORT) 160-4.5 MCG/ACT inhaler, Inhale 2 puffs into the lungs 2 (two) times daily., Disp: 1 each, Rfl: 3   EPINEPHrine (PRIMATENE MIST) 0.125 MG/ACT AERO, Inhale 1 puff into the lungs daily as needed (shortness of breath)., Disp: , Rfl:     ibuprofen (ADVIL) 800 MG tablet, Take 1 tablet (800 mg total) by mouth every 8 (eight) hours as needed., Disp: 30 tablet, Rfl: 0   Pembrolizumab (KEYTRUDA IV), Inject 1 Dose into the vein See admin instructions. Every 3rd Tuesday (Patient not taking: Reported on 01/24/2022), Disp: , Rfl:  No current facility-administered medications for this visit.  Facility-Administered Medications Ordered in Other Visits:    sodium chloride flush (NS) 0.9 % injection 10 mL, 10 mL, Intravenous, PRN, Sindy Guadeloupe, MD, 10 mL at 03/09/20 0919  Physical exam:  Vitals:   04/25/22 0949  BP: 117/74  Pulse: 68  Resp: 20  Temp: 97.9 F (36.6 C)  SpO2: 100%  Weight: 187 lb (84.8 kg)   Physical Exam Constitutional:      General: He is not in acute distress. Cardiovascular:     Rate and Rhythm: Normal rate and regular rhythm.     Heart sounds: Normal heart sounds.  Pulmonary:     Effort: Pulmonary effort is normal.     Breath sounds: Normal breath sounds.  Skin:    General: Skin is warm and dry.  Neurological:     Mental Status: He is alert and oriented to person, place, and time.         Latest Ref Rng & Units 04/25/2022    9:24 AM  CMP  Glucose 70 - 99 mg/dL 98   BUN 8 - 23 mg/dL 15   Creatinine 0.61 - 1.24 mg/dL 0.90   Sodium 135 - 145 mmol/L 137   Potassium 3.5 - 5.1 mmol/L 3.8   Chloride 98 - 111 mmol/L 107   CO2 22 - 32 mmol/L 25   Calcium 8.9 - 10.3 mg/dL 8.7   Total Protein 6.5 - 8.1 g/dL 7.2   Total Bilirubin 0.3 - 1.2 mg/dL 0.5   Alkaline Phos 38 - 126 U/L 85   AST 15 - 41 U/L 24   ALT 0 - 44 U/L 12       Latest Ref Rng & Units 04/25/2022    9:24 AM  CBC  WBC 4.0 - 10.5 K/uL 5.7   Hemoglobin 13.0 - 17.0 g/dL 13.2   Hematocrit 39.0 - 52.0 % 39.1   Platelets 150 - 400 K/uL 266     No images are attached to the encounter.  CT CHEST ABDOMEN PELVIS W CONTRAST  Result Date: 04/20/2022 CLINICAL DATA:  History of squamous cell carcinoma the palatine tonsil. Mets to lung and  hilar lymph nodes identified on diagnosis January 2001. Immunotherapy ongoing. * Tracking Code: BO * EXAM: CT CHEST, ABDOMEN, AND PELVIS WITH CONTRAST TECHNIQUE: Multidetector CT imaging of the chest, abdomen and pelvis was performed following the standard protocol during bolus administration of intravenous contrast. RADIATION DOSE REDUCTION: This exam was performed according to the departmental dose-optimization program which includes automated exposure control, adjustment of the mA and/or kV according to patient size and/or use of iterative reconstruction technique. CONTRAST:  118mL OMNIPAQUE IOHEXOL 300 MG/ML  SOLN COMPARISON:  PET-CT scan 01/19/2022 FINDINGS: CT CHEST FINDINGS Cardiovascular: Port in the anterior chest wall with tip in distal SVC. No significant vascular findings. Normal heart size. No pericardial effusion. Mediastinum/Nodes: No axillary or supraclavicular adenopathy. No mediastinal or hilar adenopathy. No pericardial fluid. Esophagus normal. Lungs/Pleura: New pulmonary nodule in the RIGHT upper lobe measures 5 mm (image 58/CT series 5). Second similar nodule in the LEFT upper lobe measuring 5 mm (image 57). Four round nodules in the RIGHT lower lobe. Example nodule measuring 7 mm image 94) Musculoskeletal: No aggressive osseous lesion. CT ABDOMEN AND PELVIS FINDINGS Hepatobiliary: New round peripherally enhancing hypodense mass within the RIGHT hepatic lobe measures 41 x 41 mm (image 61/3). More subtle lesion in the posterior LEFT hepatic lobe measures 15 mm image 52. Pancreas: Pancreas is normal. No ductal dilatation. No pancreatic inflammation. Spleen: Normal spleen Adrenals/urinary tract: Adrenal glands and kidneys are normal. The ureters and bladder normal. Stomach/Bowel: Stomach, small bowel, appendix, and cecum are normal. The colon and rectosigmoid colon are normal. Vascular/Lymphatic: Abdominal aorta is normal caliber. There is no retroperitoneal or periportal lymphadenopathy. No pelvic  lymphadenopathy. Reproductive: Unremarkable Other: No peritoneal metastasis Musculoskeletal: No aggressive osseous lesion. IMPRESSION: Chest Impression: 1. New bilateral small round pulmonary nodules consistent with pulmonary metastasis. 2. No mediastinal adenopathy. Abdomen / Pelvis Impression: 1. New large round lesion RIGHT hepatic lobe and smaller lesion in the LEFT hepatic lobe are consistent with hepatic metastasis. 2. No metastatic adenopathy in the abdomen pelvis. 3. No skeletal metastasis. These results will be called to the ordering clinician or  representative by the Radiologist Assistant, and communication documented in the PACS or [employer]. Electronically Signed   By: Suzy Bouchard M.D.   On: 04/20/2022 11:24   CT SOFT TISSUE NECK W CONTRAST  Result Date: 04/19/2022 CLINICAL DATA:  Squamous cell carcinoma the right tonsil follow-up EXAM: CT NECK WITH CONTRAST TECHNIQUE: Multidetector CT imaging of the neck was performed using the standard protocol following the bolus administration of intravenous contrast. RADIATION DOSE REDUCTION: This exam was performed according to the departmental dose-optimization program which includes automated exposure control, adjustment of the mA and/or kV according to patient size and/or use of iterative reconstruction technique. CONTRAST:  157mL OMNIPAQUE IOHEXOL 300 MG/ML  SOLN COMPARISON:  11/22/2021 FINDINGS: Pharynx and larynx: Progression of soft tissue thickening with heterogeneous enhancement in the right palatine tonsillar region and adjacent oropharynx and nasopharynx. There is extension to at least midline. Effacement of the right parapharyngeal fat. Salivary glands: Parotid and submandibular glands are unremarkable. Thyroid: Normal. Lymph nodes: Necrotic right level 2 node is mildly decreased in size measuring 2.1 x 1.8 cm (previously 2.2 x 1.9 cm). New necrotic right level 2 node more superiorly measuring 1.1 cm in short axis. New left level 2  necrotic node measuring 1.3 cm in short axis. Additional smaller also likely necrotic left level 2 nodes. Necrotic right retropharyngeal node contiguous with the abnormal tonsillar tissue has increased in size. Representative short axis measurement of necrotic portion on series 3, image 28 of 1.2 cm. Vascular: Major neck vessels are patent. Right chest wall port catheter partially imaged within the SVC. Limited intracranial: No abnormal enhancement. Visualized orbits: Unremarkable. Mastoids and visualized paranasal sinuses: Paranasal sinus mucosal thickening. Mastoid air cells are clear. Skeleton: Degenerative changes of the included spine. Upper chest: Dictated separately. IMPRESSION: Progressive primary right oropharyngeal malignancy with nasopharyngeal and parapharyngeal extension. There is extension to at least midline, noting new involvement of left cervical nodes. Progressive right retropharyngeal and bilateral level 2 adenopathy. Electronically Signed   By: Macy Mis M.D.   On: 04/19/2022 17:02     Assessment and plan- Patient is a 71 y.o. 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  He had scans in May 2023 which did show disease progression in his neck with progressive right oropharyngeal malignancy with parapharyngeal and nasopharyngeal extension.  New involvement of left cervical lymph nodes in addition to the right.  At that time I had recommended that he should get started on his treatment again and given that there was no evidence of distant metastatic disease at that time concurrent chemoradiation with a curative intent was a potential option.  However patient chose not to proceed with treatment at that time.  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  He understood back then that without treatment we are  looking at further disease progression and potential metastatic spread.  Moving forward to 3 more months now CT scans unfortunately shows evidence of distant metastatic disease with new liver metastases as well as lung nodules.  I have reviewed his CT soft tissue neck and CT chest abdomen and pelvis images independently.  I recommend getting a repeat biopsy to reconfirm diagnosis as well as run NGS testing.  Since it has been over 2 years when he received CarboTaxol chemotherapy it would be worth retrying the same palliative  regimen at this time.  There would be no role for definitive concurrent chemoradiation given the distant spread.  However patient continues to refuse treatment at this time he wants to focus on his work and desires a repeat scan in 3 months.  He states that he is willing to consider treatment if he starts developing symptoms.  I tried to explain to the patient that there is always better to treatment when performance status is better and he is minimally symptomatic.  However patient is not interested in that option at this time.  And therefore seeing him back in 3 months time with repeat CT chest abdomen and pelvis with contrast and soft tissue neck.   Visit Diagnosis 1. 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 Abnormal CT scan, neck   3. Abnormal CT of liver      Dr. Randa Evens, MD, MPH [facility] [phone] 04/27/2022 8:58 AM

## 2022-06-16 ENCOUNTER — Other Ambulatory Visit: Payer: Self-pay | Admitting: Oncology

## 2022-06-19 ENCOUNTER — Encounter: Payer: Self-pay | Admitting: Oncology

## 2022-06-26 ENCOUNTER — Encounter (INDEPENDENT_AMBULATORY_CARE_PROVIDER_SITE_OTHER): Payer: Self-pay

## 2022-07-27 ENCOUNTER — Ambulatory Visit: Payer: Medicare Other

## 2022-07-27 ENCOUNTER — Ambulatory Visit
Admission: RE | Admit: 2022-07-27 | Discharge: 2022-07-27 | Disposition: A | Discharge status: Home / Self Care | Payer: Medicare Other | Source: Ambulatory Visit | Attending: Oncology | Admitting: Oncology

## 2022-07-27 DIAGNOSIS — R9389 Abnormal findings on diagnostic imaging of other specified body structures: Secondary | ICD-10-CM | POA: Insufficient documentation

## 2022-07-27 DIAGNOSIS — C099 Malignant neoplasm of tonsil, unspecified: Secondary | ICD-10-CM | POA: Diagnosis not present

## 2022-07-27 DIAGNOSIS — R932 Abnormal findings on diagnostic imaging of liver and biliary tract: Secondary | ICD-10-CM | POA: Insufficient documentation

## 2022-07-27 LAB — POCT I-STAT CREATININE: Creatinine, Ser: 0.9 mg/dL (ref 0.61–1.24)

## 2022-07-27 MED ORDER — IOHEXOL 300 MG/ML  SOLN
100.0000 mL | Freq: Once | INTRAMUSCULAR | Status: AC | PRN
  Administered 2022-07-27: 100 mL via INTRAVENOUS

## 2022-07-31 ENCOUNTER — Observation Stay
Admission: EM | Admit: 2022-07-31 | Discharge: 2022-08-01 | Disposition: A | Discharge status: Hospice | Payer: Medicare Other | Attending: Osteopathic Medicine | Admitting: Osteopathic Medicine

## 2022-07-31 ENCOUNTER — Other Ambulatory Visit: Payer: Self-pay

## 2022-07-31 ENCOUNTER — Inpatient Hospital Stay (HOSPITAL_BASED_OUTPATIENT_CLINIC_OR_DEPARTMENT_OTHER): Payer: Medicare Other | Admitting: Hospice and Palliative Medicine

## 2022-07-31 ENCOUNTER — Encounter: Payer: Self-pay | Admitting: Oncology

## 2022-07-31 ENCOUNTER — Inpatient Hospital Stay (HOSPITAL_BASED_OUTPATIENT_CLINIC_OR_DEPARTMENT_OTHER): Payer: Medicare Other | Admitting: Oncology

## 2022-07-31 ENCOUNTER — Inpatient Hospital Stay: Payer: Medicare Other | Attending: Oncology

## 2022-07-31 ENCOUNTER — Emergency Department: Payer: Medicare Other

## 2022-07-31 ENCOUNTER — Inpatient Hospital Stay: Payer: Medicare Other

## 2022-07-31 VITALS — BP 68/52 | HR 79 | Temp 99.6°F | Resp 18

## 2022-07-31 DIAGNOSIS — Z95828 Presence of other vascular implants and grafts: Secondary | ICD-10-CM

## 2022-07-31 DIAGNOSIS — J4520 Mild intermittent asthma, uncomplicated: Secondary | ICD-10-CM

## 2022-07-31 DIAGNOSIS — C099 Malignant neoplasm of tonsil, unspecified: Secondary | ICD-10-CM | POA: Diagnosis present

## 2022-07-31 DIAGNOSIS — R932 Abnormal findings on diagnostic imaging of liver and biliary tract: Secondary | ICD-10-CM

## 2022-07-31 DIAGNOSIS — J45909 Unspecified asthma, uncomplicated: Secondary | ICD-10-CM | POA: Diagnosis present

## 2022-07-31 DIAGNOSIS — R55 Syncope and collapse: Secondary | ICD-10-CM | POA: Diagnosis not present

## 2022-07-31 DIAGNOSIS — Z9221 Personal history of antineoplastic chemotherapy: Secondary | ICD-10-CM | POA: Diagnosis not present

## 2022-07-31 DIAGNOSIS — Y92009 Unspecified place in unspecified non-institutional (private) residence as the place of occurrence of the external cause: Secondary | ICD-10-CM | POA: Diagnosis not present

## 2022-07-31 DIAGNOSIS — C7800 Secondary malignant neoplasm of unspecified lung: Secondary | ICD-10-CM | POA: Diagnosis not present

## 2022-07-31 DIAGNOSIS — Z7189 Other specified counseling: Secondary | ICD-10-CM

## 2022-07-31 DIAGNOSIS — C771 Secondary and unspecified malignant neoplasm of intrathoracic lymph nodes: Secondary | ICD-10-CM | POA: Insufficient documentation

## 2022-07-31 DIAGNOSIS — E43 Unspecified severe protein-calorie malnutrition: Secondary | ICD-10-CM | POA: Diagnosis not present

## 2022-07-31 DIAGNOSIS — Z79899 Other long term (current) drug therapy: Secondary | ICD-10-CM | POA: Diagnosis not present

## 2022-07-31 DIAGNOSIS — R519 Headache, unspecified: Secondary | ICD-10-CM | POA: Diagnosis not present

## 2022-07-31 DIAGNOSIS — Z515 Encounter for palliative care: Secondary | ICD-10-CM

## 2022-07-31 DIAGNOSIS — G893 Neoplasm related pain (acute) (chronic): Secondary | ICD-10-CM | POA: Diagnosis not present

## 2022-07-31 DIAGNOSIS — W19XXXA Unspecified fall, initial encounter: Secondary | ICD-10-CM

## 2022-07-31 DIAGNOSIS — R1310 Dysphagia, unspecified: Secondary | ICD-10-CM | POA: Diagnosis not present

## 2022-07-31 DIAGNOSIS — I959 Hypotension, unspecified: Secondary | ICD-10-CM | POA: Diagnosis present

## 2022-07-31 DIAGNOSIS — R9389 Abnormal findings on diagnostic imaging of other specified body structures: Secondary | ICD-10-CM

## 2022-07-31 LAB — CBC WITH DIFFERENTIAL/PLATELET
Abs Immature Granulocytes: 0.06 10*3/uL (ref 0.00–0.07)
Abs Immature Granulocytes: 0.06 10*3/uL (ref 0.00–0.07)
Basophils Absolute: 0 10*3/uL (ref 0.0–0.1)
Basophils Absolute: 0 10*3/uL (ref 0.0–0.1)
Basophils Relative: 0 %
Basophils Relative: 0 %
Eosinophils Absolute: 0.1 10*3/uL (ref 0.0–0.5)
Eosinophils Absolute: 0 10*3/uL (ref 0.0–0.5)
Eosinophils Relative: 1 %
Eosinophils Relative: 0 %
HCT: 35.8 % — ABNORMAL LOW (ref 39.0–52.0)
HCT: 34 % — ABNORMAL LOW (ref 39.0–52.0)
Hemoglobin: 11.7 g/dL — ABNORMAL LOW (ref 13.0–17.0)
Hemoglobin: 10.9 g/dL — ABNORMAL LOW (ref 13.0–17.0)
Immature Granulocytes: 1 %
Immature Granulocytes: 1 %
Lymphocytes Relative: 9 %
Lymphocytes Relative: 6 %
Lymphs Abs: 0.8 10*3/uL (ref 0.7–4.0)
Lymphs Abs: 0.6 10*3/uL — ABNORMAL LOW (ref 0.7–4.0)
MCH: 31.5 pg (ref 26.0–34.0)
MCH: 31.4 pg (ref 26.0–34.0)
MCHC: 32.7 g/dL (ref 30.0–36.0)
MCHC: 32.1 g/dL (ref 30.0–36.0)
MCV: 96.2 fL (ref 80.0–100.0)
MCV: 98 fL (ref 80.0–100.0)
Monocytes Absolute: 0.7 10*3/uL (ref 0.1–1.0)
Monocytes Absolute: 0.8 10*3/uL (ref 0.1–1.0)
Monocytes Relative: 8 %
Monocytes Relative: 8 %
Neutro Abs: 6.6 10*3/uL (ref 1.7–7.7)
Neutro Abs: 8.5 10*3/uL — ABNORMAL HIGH (ref 1.7–7.7)
Neutrophils Relative %: 81 %
Neutrophils Relative %: 85 %
Platelets: 391 10*3/uL (ref 150–400)
Platelets: 369 10*3/uL (ref 150–400)
RBC: 3.72 MIL/uL — ABNORMAL LOW (ref 4.22–5.81)
RBC: 3.47 MIL/uL — ABNORMAL LOW (ref 4.22–5.81)
RDW: 13.2 % (ref 11.5–15.5)
RDW: 13.2 % (ref 11.5–15.5)
WBC: 8.2 10*3/uL (ref 4.0–10.5)
WBC: 10 10*3/uL (ref 4.0–10.5)
nRBC: 0 % (ref 0.0–0.2)
nRBC: 0 % (ref 0.0–0.2)

## 2022-07-31 LAB — COMPREHENSIVE METABOLIC PANEL
ALT: 20 U/L (ref 0–44)
ALT: 17 U/L (ref 0–44)
AST: 57 U/L — ABNORMAL HIGH (ref 15–41)
AST: 51 U/L — ABNORMAL HIGH (ref 15–41)
Albumin: 3.6 g/dL (ref 3.5–5.0)
Albumin: 3.3 g/dL — ABNORMAL LOW (ref 3.5–5.0)
Alkaline Phosphatase: 162 U/L — ABNORMAL HIGH (ref 38–126)
Alkaline Phosphatase: 166 U/L — ABNORMAL HIGH (ref 38–126)
Anion gap: 15 (ref 5–15)
Anion gap: 10 (ref 5–15)
BUN: 18 mg/dL (ref 8–23)
BUN: 20 mg/dL (ref 8–23)
CO2: 22 mmol/L (ref 22–32)
CO2: 25 mmol/L (ref 22–32)
Calcium: 9.1 mg/dL (ref 8.9–10.3)
Calcium: 9.1 mg/dL (ref 8.9–10.3)
Chloride: 99 mmol/L (ref 98–111)
Chloride: 102 mmol/L (ref 98–111)
Creatinine, Ser: 0.86 mg/dL (ref 0.61–1.24)
Creatinine, Ser: 0.86 mg/dL (ref 0.61–1.24)
GFR, Estimated: >60 mL/min (ref >60)
GFR, Estimated: >60 mL/min (ref >60)
Glucose, Bld: 133 mg/dL — ABNORMAL HIGH (ref 70–99)
Glucose, Bld: 94 mg/dL (ref 70–99)
Potassium: 3.6 mmol/L (ref 3.5–5.1)
Potassium: 4.3 mmol/L (ref 3.5–5.1)
Sodium: 136 mmol/L (ref 135–145)
Sodium: 137 mmol/L (ref 135–145)
Total Bilirubin: 1 mg/dL (ref 0.3–1.2)
Total Bilirubin: 1.1 mg/dL (ref 0.3–1.2)
Total Protein: 7.5 g/dL (ref 6.5–8.1)
Total Protein: 7.3 g/dL (ref 6.5–8.1)

## 2022-07-31 LAB — TROPONIN I (HIGH SENSITIVITY): Troponin I (High Sensitivity): 6 ng/L (ref <18)

## 2022-07-31 MED ORDER — MOMETASONE FURO-FORMOTEROL FUM 200-5 MCG/ACT IN AERO
2.0000 | INHALATION_SPRAY | Freq: Two times a day (BID) | RESPIRATORY_TRACT | Status: DC
  Filled 2022-07-31: qty 8.8

## 2022-07-31 MED ORDER — IOHEXOL 350 MG/ML SOLN
75.0000 mL | Freq: Once | INTRAVENOUS | Status: AC | PRN
  Administered 2022-07-31: 40 mL via INTRAVENOUS

## 2022-07-31 MED ORDER — ONDANSETRON HCL 4 MG/2ML IJ SOLN: 4.0000 mg | Freq: Three times a day (TID) | INTRAMUSCULAR | Status: DC | PRN

## 2022-07-31 MED ORDER — LACTATED RINGERS IV BOLUS
1000.0000 mL | Freq: Once | INTRAVENOUS | Status: AC
  Administered 2022-07-31: 1000 mL via INTRAVENOUS

## 2022-07-31 MED ORDER — ALBUTEROL SULFATE (2.5 MG/3ML) 0.083% IN NEBU: 3.0000 mL | INHALATION_SOLUTION | RESPIRATORY_TRACT | Status: DC | PRN

## 2022-07-31 MED ORDER — HYDROMORPHONE HCL 1 MG/ML IJ SOLN
1.0000 mg | INTRAMUSCULAR | Status: DC | PRN
  Administered 2022-07-31 – 2022-08-01 (×2): 1 mg via INTRAVENOUS
  Filled 2022-07-31 (×2): qty 1

## 2022-07-31 MED ORDER — ACETAMINOPHEN 325 MG PO TABS
650.0000 mg | ORAL_TABLET | Freq: Four times a day (QID) | ORAL | Status: DC | PRN
  Administered 2022-08-01: 650 mg via ORAL
  Filled 2022-07-31: qty 2

## 2022-07-31 MED ORDER — OXYCODONE-ACETAMINOPHEN 5-325 MG PO TABS
1.0000 | ORAL_TABLET | ORAL | Status: DC | PRN
  Administered 2022-07-31 – 2022-08-01 (×3): 1 via ORAL
  Filled 2022-07-31 (×3): qty 1

## 2022-07-31 MED ORDER — ONDANSETRON HCL 4 MG/2ML IJ SOLN
4.0000 mg | Freq: Once | INTRAMUSCULAR | Status: AC
  Administered 2022-07-31: 4 mg via INTRAVENOUS

## 2022-07-31 MED ORDER — SODIUM CHLORIDE 0.9% FLUSH
10.0000 mL | Freq: Once | INTRAVENOUS | Status: AC
  Administered 2022-07-31: 10 mL via INTRAVENOUS
  Filled 2022-07-31: qty 10

## 2022-07-31 MED ORDER — MORPHINE SULFATE (PF) 4 MG/ML IV SOLN
4.0000 mg | Freq: Once | INTRAVENOUS | Status: AC
  Administered 2022-07-31: 4 mg via INTRAVENOUS
  Filled 2022-07-31: qty 1

## 2022-07-31 MED ORDER — ENOXAPARIN SODIUM 40 MG/0.4ML IJ SOSY
40.0000 mg | PREFILLED_SYRINGE | INTRAMUSCULAR | Status: DC
  Administered 2022-07-31: 40 mg via SUBCUTANEOUS
  Filled 2022-07-31: qty 0.4

## 2022-07-31 MED ORDER — ONDANSETRON HCL 4 MG/2ML IJ SOLN
4.0000 mg | Freq: Once | INTRAMUSCULAR | Status: AC
  Administered 2022-07-31: 4 mg via INTRAVENOUS
  Filled 2022-07-31: qty 2

## 2022-07-31 MED ORDER — DM-GUAIFENESIN ER 30-600 MG PO TB12: 1.0000 | ORAL_TABLET | Freq: Two times a day (BID) | ORAL | Status: DC | PRN

## 2022-07-31 MED ORDER — ACETAMINOPHEN 650 MG RE SUPP: 650.0000 mg | Freq: Four times a day (QID) | RECTAL | Status: DC | PRN

## 2022-07-31 MED ORDER — HEPARIN SOD (PORK) LOCK FLUSH 100 UNIT/ML IV SOLN
500.0000 [IU] | Freq: Once | INTRAVENOUS | Status: AC
  Administered 2022-07-31: 500 [IU] via INTRAVENOUS
  Filled 2022-07-31: qty 5

## 2022-07-31 MED ORDER — ENSURE ENLIVE PO LIQD: 237.0000 mL | Freq: Two times a day (BID) | ORAL | Status: DC

## 2022-07-31 NOTE — IPAL (Signed)
Need to choose "IPAL" for type of note, need to open a new note billing [phone] for > 84min   Interdisciplinary Goals of Care Family Meeting  Interdisciplinary Goals of Care Family Meeting Date carried out: 07/31/2022 Location of the meeting: Bedside in ED Member's involved: Physician, Social Worker, Family Member or next of kin, and Other: daughter Hospital doctor or Loss adjuster, chartered: none Discussion: We discussed goals of care for [employer]. .  Code status: Full DNR Disposition: Home with Hospice pt wants home hospice care  Time spent for the meeting: [place] 07/31/2022, @NOW 

## 2022-07-31 NOTE — ED Notes (Signed)
Pt ambulated with no assistance needed.

## 2022-07-31 NOTE — Progress Notes (Signed)
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 Richardson Landry for symptoms of sore throat and difficulty swallowing which she has been experiencing over the last 1 year.He underwent a comprehensive auto laryngoscopy exam which showed a firm enlarged right tonsillar mass with necrotic area extending onto the adjacent soft palate into the root of the uvula.  Hypopharynx and larynx as well as nasopharynx was unable to be visualized due to gag reflex.  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   PET CT scan showed large hypermetabolic right palatine tonsil mass which crosses the midline.  Ipsilateral cervical nodal metastases.  Bilateral pulmonary nodules including a dominant 1 cm hypermetabolic right lung base nodule.  Multiplicity favor pulmonary metastases.  Subcarinal and equivocal left hilar nodal metastases.  Hypermetabolism involving the ascending colon.  Question mild diverticulitis versus underlying colonic mass or polyp cannot be excluded     Patient underwent EBUS guided biopsy of hilar lymph nodes.  Biopsy showed squamous cell carcinoma.  However HPV/p16 status was uninterpretable.    Patient completed 6 cycles of carbotaxol chemotherapy along with Keytruda and was on maintenance Keytruda until February 2023.  Interval history-patient is doingTo her overall.  He is  here with his daughter today and lives alone.  Son lives next door.  Appetite is poor and he has lost considerable weight since his last visit.  Also reports significant headache and difficulty swallowing food.  ECOG PS- 3 Pain scale- 8 Opioid associated constipation- no  Review of systems- Review of Systems  Constitutional:  Positive for malaise/fatigue and weight loss. Negative for chills and fever.  HENT:  Negative for congestion, ear discharge and nosebleeds.   Eyes:  Negative for blurred vision.  Respiratory:  Negative for cough, hemoptysis, sputum production, shortness of breath and wheezing.   Cardiovascular:  Negative for chest pain, palpitations, orthopnea and claudication.  Gastrointestinal:  Positive for nausea and vomiting. Negative for abdominal pain, blood in stool, constipation, diarrhea, heartburn and melena.       Difficulty swallowing food.  Genitourinary:  Negative for dysuria, flank pain, frequency, hematuria and urgency.  Musculoskeletal:  Negative for back pain, joint pain and myalgias.  Skin:  Negative for rash.  Neurological:  Positive for headaches. Negative for dizziness, tingling, focal weakness, seizures and weakness.  Endo/Heme/Allergies:  Does not bruise/bleed easily.  Psychiatric/Behavioral:  Negative for depression and suicidal ideas. The patient does not have insomnia.       No Known Allergies   Past Medical History:  Diagnosis Date   Asthma    History of hiatal hernia    Inguinal hernia recurrent unilateral    Lung nodules    Metastasis to lung (HCC)    tonsil cancer and mets to lung   Tonsil cancer ([place])    Tonsil cancer [facility])      Past Surgical History:  Procedure Laterality Date   CERVICAL SPINE SURGERY  LUMBAR SPINE SURGERY     PORTA CATH INSERTION N/A 10/08/2019   Procedure: PORTA CATH INSERTION;  Surgeon: Katha Cabal, MD;  Location: [facility];  Service: Cardiovascular;  Laterality: N/A;   VIDEO BRONCHOSCOPY WITH  ENDOBRONCHIAL NAVIGATION N/A 10/10/2019   Procedure: VIDEO BRONCHOSCOPY WITH ENDOBRONCHIAL NAVIGATION;  Surgeon: Tyler Pita, MD;  Location: [facility];  Service: Pulmonary;  Laterality: N/A;   VIDEO BRONCHOSCOPY WITH ENDOBRONCHIAL ULTRASOUND N/A 10/10/2019   Procedure: VIDEO BRONCHOSCOPY WITH ENDOBRONCHIAL ULTRASOUND;  Surgeon: Tyler Pita, MD;  Location: [facility];  Service: Pulmonary;  Laterality: N/A;    Social History   Socioeconomic History   Marital status: Widowed    Spouse name: Not on file   Number of children: Not on file   Years of education: Not on file   Highest education level: Not on file  Occupational History   Not on file  Tobacco Use   Smoking status: Never   Smokeless tobacco: Never  Vaping Use   Vaping Use: Never used  Substance and Sexual Activity   Alcohol use: Never   Drug use: Never   Sexual activity: Not Currently  Other Topics Concern   Not on file  Social History Narrative   Not on file   Social Determinants of Health   Financial Resource Strain: Not on file  Food Insecurity: Not on file  Transportation Needs: Not on file  Physical Activity: Not on file  Stress: Not on file  Social Connections: Not on file  Intimate Partner Violence: Not on file    Family History  Problem Relation Age of Onset   Cancer Mother 57       not sure what type   Prostate cancer Father    Rheum arthritis Father    Healthy Sister    Healthy Brother    Healthy Sister    Healthy Brother    Healthy Brother    Heart disease Brother    Heart disease Brother     No current facility-administered medications for this visit.  Current Outpatient Medications:    acetaminophen (TYLENOL) 500 MG tablet, Take 2,000 mg by mouth 3 (three) times daily as needed for moderate pain., Disp: , Rfl:    EPINEPHrine (PRIMATENE MIST) 0.125 MG/ACT AERO, Inhale 1 puff into the lungs daily as needed (shortness of breath)., Disp: , Rfl:    ibuprofen (ADVIL) 800 MG tablet,  Take 1 tablet (800 mg total) by mouth every 8 (eight) hours as needed., Disp: 30 tablet, Rfl: 0   Pembrolizumab (KEYTRUDA IV), Inject 1 Dose into the vein See admin instructions. Every 3rd Tuesday (Patient not taking: Reported on 01/24/2022), Disp: , Rfl:    SYMBICORT 160-4.5 MCG/ACT inhaler, Inhale 2 puffs by mouth twice daily, Disp: 11 g, Rfl: 0  Facility-Administered Medications Ordered in Other Visits:    sodium chloride flush (NS) 0.9 % injection 10 mL, 10 mL, Intravenous, PRN, Sindy Guadeloupe, MD, 10 mL at 03/09/20 0919  Physical exam:  Vitals:   07/31/22 1042  BP: (!) 68/52  Pulse: 79  Resp: 18  Temp: 99.6 F (37.6 C)  SpO2: 100%   Physical Exam Constitutional:      Comments: He is sitting in a wheelchair.  Appears weak and cachectic  Cardiovascular:     Rate and Rhythm: Regular rhythm. Tachycardia present.     Heart sounds: Normal heart sounds.  Pulmonary:     Effort: Pulmonary effort is normal.     Breath sounds:  Normal breath sounds.  Abdominal:     General: Bowel sounds are normal.     Palpations: Abdomen is soft.  Skin:    General: Skin is warm and dry.  Neurological:     General: No focal deficit present.     Mental Status: He is alert and oriented to person, place, and time.         Latest Ref Rng & Units 07/31/2022   10:08 AM  CMP  Glucose 70 - 99 mg/dL 133   BUN 8 - 23 mg/dL 18   Creatinine 0.61 - 1.24 mg/dL 0.86   Sodium 135 - 145 mmol/L 136   Potassium 3.5 - 5.1 mmol/L 3.6   Chloride 98 - 111 mmol/L 99   CO2 22 - 32 mmol/L 22   Calcium 8.9 - 10.3 mg/dL 9.1   Total Protein 6.5 - 8.1 g/dL 7.5   Total Bilirubin 0.3 - 1.2 mg/dL 1.0   Alkaline Phos 38 - 126 U/L 162   AST 15 - 41 U/L 57   ALT 0 - 44 U/L 20       Latest Ref Rng & Units 07/31/2022   10:08 AM  CBC  WBC 4.0 - 10.5 K/uL 8.2   Hemoglobin 13.0 - 17.0 g/dL 11.7   Hematocrit 39.0 - 52.0 % 35.8   Platelets 150 - 400 K/uL 391     No images are attached to the encounter.  CT SOFT TISSUE  NECK W CONTRAST  Result Date: 07/27/2022 CLINICAL DATA:  History of stage IV squamous cell tonsillar carcinoma. A EXAM: CT NECK WITH CONTRAST TECHNIQUE: Multidetector CT imaging of the neck was performed using the standard protocol following the bolus administration of intravenous contrast. RADIATION DOSE REDUCTION: This exam was performed according to the departmental dose-optimization program which includes automated exposure control, adjustment of the mA and/or kV according to patient size and/or use of iterative reconstruction technique. CONTRAST:  186mL OMNIPAQUE IOHEXOL 300 MG/ML  SOLN COMPARISON:  CT of the neck 04/19/2022. PET scan 01/19/2022. CT neck 11/22/2021. FINDINGS: Pharynx and larynx: Progressive enhancing tumor extends cephalo caudad from the nasopharynx the posterior oropharynx measuring 7.2 cm. For extensive heterogeneous enhancement the soft palate is present. Tumor enhancement extends across midline in the posterior oropharynx measuring 4.5 cm in transverse dimension. Bulky tumor is present in the posterior left oropharynx measuring 1.9 cm anterior-posterior. Salivary glands: The submandibular and parotid glands and ducts are within normal limits. Thyroid: Normal Lymph nodes: Progressive bilateral malignant adenopathy is present. A right lateral retropharyngeal node demonstrates continued growth, now measuring 2.1 x 1.3 cm. Heterogeneous enlarged left level 2 lymph nodes have progressed. The largest node now measures 3.7 x 2.2 x 2.6 cm. Adjacent superior node or lobulation of this node measures an additional 21 mm. The largest right level 2 lymph node measures 2.8 x 1.8 x 1.8 cm. Based rounded left level 3 and level 4 lymph nodes have also increased in size. Peripherally enhancing right level 3 lymph nodes have increased in size, the largest now measuring 11 x 6 mm. Vascular: No significant vascular lesions are present. Limited intracranial: Within normal limits. Visualized orbits: The globes  and orbits are within normal limits. Mastoids and visualized paranasal sinuses: Fluid is present in the right lateral recess scratched at fluid is present in the lateral recess the right sphenoid sinus. A posterior left ethmoid air cells opacified. Scattered mucosal thickening is present throughout ethmoid air cells bilaterally. A polyp or mucous retention cyst is present in  the right frontoethmoidal recess. Mild mucosal thickening is present in the inferior right maxillary sinus. Mastoid air cells are clear. Skeleton: Degenerative changes of the lower cervical spine are again noted with disease greatest at C6-7, left greater than right. 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 Upper chest: Multiple bilateral pulmonary nodules are present, better described on chest CT of the same day. IMPRESSION: 1. Progressive enhancing tumor extending from the nasopharynx to the posterior oropharynx, predominantly on the right. Tumor is progressed across midline in the oropharynx. 2. Progressive bilateral malignant adenopathy. 3. Multiple bilateral pulmonary nodules, better described on chest CT of the same day. Electronically Signed   By: San Morelle M.D.   On: 07/27/2022 16:18   CT CHEST ABDOMEN PELVIS W CONTRAST  Result Date: 07/27/2022 CLINICAL DATA:  History of stage IV squamous cell tonsillar cancer. Restaging/follow-up. * Tracking Code: BO * EXAM: CT CHEST, ABDOMEN, AND PELVIS WITH CONTRAST TECHNIQUE: Multidetector CT imaging of the chest, abdomen and pelvis was performed following the standard protocol during bolus administration of intravenous contrast. RADIATION DOSE REDUCTION: This exam was performed according to the departmental dose-optimization program which includes automated exposure control, adjustment of the mA and/or kV according to patient size and/or use of iterative reconstruction technique. CONTRAST:  135mL OMNIPAQUE IOHEXOL 300 MG/ML  SOLN COMPARISON:  Multiple priors including most recent CT  April 19, 2022. FINDINGS: CT CHEST FINDINGS Cardiovascular: Right chest wall Port-A-Cath with tip at the superior cavoatrial junction. Aortic atherosclerosis. No central pulmonary embolus on this nondedicated study. Normal size heart. No significant pericardial effusion/thickening. Mediastinum/Nodes: No suspicious thyroid nodule. New enlarged left hilar lymph node measures 12 mm on image 43/4. The esophagus is grossly unremarkable. Lungs/Pleura: Significant increase in size and number of the numerous bilateral pulmonary metastases. Indexed nodules are as follows: -right upper lobe pulmonary nodule measures 9 mm on image 80/6 previously 5 mm. -left upper lobe pulmonary nodule measures 10 mm on image 92/6 previously 5 mm. -right lower lobe pulmonary nodule measures 15 mm on image 131/6 previously 7 mm. No pleural effusion.  No pneumothorax. Musculoskeletal: Multilevel degenerative changes spine. No aggressive lytic or blastic lesion of bone. CT ABDOMEN PELVIS FINDINGS Hepatobiliary: Increase in size and number of the bilobar hepatic metastatic lesions. Index lesions are as follows: -dominant lesion in the left right lobe of the liver now measures 8.7 x 7.8 cm on image 73/4 previously 4.1 x 4.1 cm. -lateral left lobe of the liver lesion measures 8.2 x 6.2 cm on image 67/4 previously 2.5 x 2.2 cm when remeasured for consistency. Gallbladder is distended without evidence of acute inflammation. No biliary ductal dilation. Pancreas: No pancreatic ductal dilation or evidence of acute inflammation. Spleen: No splenomegaly. Adrenals/Urinary Tract: Subtle 7 mm nodular focus in the left adrenal gland on image 71/4 is stable dating back to at least July 02, 2020 and was not abnormally hypermetabolic on prior PET-CT likely reflecting a tiny adenoma. Right adrenal gland appears normal. Stable benign right renal cyst. Nonobstructive bilateral renal stones. Urinary bladder is unremarkable for degree of distension. Stomach/Bowel:  Stomach is unremarkable for degree of distension. No pathologic dilation of small or large bowel. Colonic diverticulosis without findings of acute diverticulitis. Vascular/Lymphatic: Normal caliber abdominal aorta. No pathologically enlarged abdominal or pelvic lymph nodes. Reproductive: Dystrophic calcifications in an enlarged prostate gland. Other: No significant abdominal free fluid. Musculoskeletal: No aggressive lytic or blastic lesion of bone. Multilevel degenerative changes spine. Degenerative changes of the bilateral hips. IMPRESSION: 1. Progression of disease with increased  burden of bilateral pulmonary metastases and bilobar hepatic metastatic lesions as well as a new enlarged left hilar lymph node. 2. No evidence of metastatic disease in the abdomen or pelvis. 3. Please refer to same day CT of the neck for findings above the clavicles. 4. Colonic diverticulosis without findings of acute diverticulitis. 5. Nonobstructive bilateral renal stones. 6. Subtle 7 mm nodular focus in the left adrenal gland is stable dating back to at least July 02, 2020 and was not abnormally hypermetabolic on prior PET-CT likely reflecting a tiny adenoma, attention on follow-up imaging suggested. 7.  Aortic Atherosclerosis (ICD10-I70.0). Electronically Signed   By: Dahlia Bailiff M.D.   On: 07/27/2022 13:24     Assessment and plan- Patient is a 71 y.o. male with metastatic head and neck cancer/squamous cell carcinoma of the oropharynx here to discuss further management  Patient has had known disease progression since March 2023 but chose to not opt for any treatment since then.  I have reviewed his CT chest abdomen and pelvis with contrast as well as CT soft tissue neck images independently and discussed findings with the patient and his daughter which shows significant progression of his tumor which is now extending into the nasopharynx oropharynx along with bilateral cervical adenopathy lung nodules as well as liver  metastases.  Patient has chosen not to opt for any treatment since March 2023.  He understands that overall his performance status is poor and he is not a candidate for any treatment at this time.  Treatment would be only palliative and not curative.  He wishes to focus on his comfort and quality of life and go home with home hospice.  However he is quite symptomatic in our clinic today with significant hypotension nausea and vomiting.  We are therefore giving him IV fluids along with pain and nausea medication and getting him admitted to the hospital.  He will need brain imaging to rule out brain metastases.  Patient does not wish to go for any whole brain radiation even if he were to have any brain metastases.  Recommend sending the patient to ER for symptom management followed by transition to home hospice.  He was also seen by palliative care NP Altha Harm today and he is now a DNR/DNI   Visit Diagnosis 1. Goals of care, counseling/discussion   2. 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 Randa Evens, MD, MPH [facility] [phone] 07/31/2022 12:53 PM

## 2022-07-31 NOTE — ED Provider Notes (Signed)
[facility] Provider Note    Event Date/Time   First MD Initiated Contact with Patient 07/31/22 1139     (approximate)   History   Chief Complaint Syncope  HPI  Patrick Mendoza. is a 71 y.o. male with past medical history of metastatic squamous cell carcinoma of the tonsil as well as asthma who presents to the ED complaining of syncope.  Patient reports that he has been dealing with increasing headache and dizziness over the right side of his head for the past 2 weeks, has been feeling unsteady on his feet during that time.  He has not had any falls and denies any trauma to his head.  He presented to the cancer center for follow-up with palliative care today, found to have a syncopal episode in their waiting room with initial BP of 68 systolic.  On arrival to the ED, he reports feeling better and denies any chest pain or shortness of breath.  He denies any recent fevers, cough, nausea, vomiting, diarrhea, or dysuria.      Physical Exam   Triage Vital Signs: ED Triage Vitals  Enc Vitals Group     BP 07/31/22 1131 101/63     Pulse Rate 07/31/22 1131 71     Resp 07/31/22 1131 18     Temp 07/31/22 1131 98.2 F (36.8 C)     Temp Source 07/31/22 1131 Oral     SpO2 07/31/22 1131 99 %     Weight 07/31/22 1132 145 lb (65.8 kg)     Height 07/31/22 1132 5\' 10"  (1.778 m)     Head Circumference --      Peak Flow --      Pain Score 07/31/22 1131 7     Pain Loc --      Pain Edu? --      Excl. in [place]? --     Most recent vital signs: Vitals:   07/31/22 1200 07/31/22 1230  BP: 108/65 114/67  Pulse: 68 73  Resp:  18  Temp:  98 F (36.7 C)  SpO2: 98% 98%    Constitutional: Alert and oriented. Eyes: Conjunctivae are normal. Head: Atraumatic. Nose: No congestion/rhinnorhea. Mouth/Throat: Mucous membranes are moist. Cardiovascular: Normal rate, regular rhythm. Grossly normal heart sounds.  2+ radial pulses bilaterally. Respiratory: Normal respiratory  effort.  No retractions. Lungs CTAB. Gastrointestinal: Soft and nontender. No distention. Musculoskeletal: No lower extremity tenderness nor edema.  Neurologic:  Normal speech and language. No gross focal neurologic deficits are appreciated.    ED Results / Procedures / Treatments   Labs (all labs ordered are listed, but only abnormal results are displayed) Labs Reviewed  CBC WITH DIFFERENTIAL/PLATELET - Abnormal; Notable for the following components:      Result Value   RBC 3.47 (*)    Hemoglobin 10.9 (*)    HCT 34.0 (*)    Neutro Abs 8.5 (*)    Lymphs Abs 0.6 (*)    All other components within normal limits  COMPREHENSIVE METABOLIC PANEL - Abnormal; Notable for the following components:   Albumin 3.3 (*)    AST 51 (*)    Alkaline Phosphatase 166 (*)    All other components within normal limits  HIV ANTIBODY (ROUTINE TESTING W REFLEX)  TROPONIN I (HIGH SENSITIVITY)     EKG  ED ECG REPORT I, Blake Divine, the attending physician, personally viewed and interpreted this ECG.   Date: 07/31/2022  EKG Time: 12:40  Rate: 91  Rhythm: normal sinus rhythm  Axis: Normal  Intervals:right bundle branch block and left anterior fascicular block  ST&T Change: None  RADIOLOGY CT head reviewed and interpreted by me with no hemorrhage or midline shift.  PROCEDURES:  Critical Care performed: No  Procedures   MEDICATIONS ORDERED IN ED: Medications  albuterol (PROVENTIL) (2.5 MG/3ML) 0.083% nebulizer solution 3 mL (has no administration in time range)  dextromethorphan-guaiFENesin (MUCINEX DM) 30-600 MG per 12 hr tablet 1 tablet (has no administration in time range)  enoxaparin (LOVENOX) injection 40 mg (has no administration in time range)  HYDROmorphone (DILAUDID) injection 1 mg (has no administration in time range)  oxyCODONE-acetaminophen (PERCOCET/ROXICET) 5-325 MG per tablet 1 tablet (has no administration in time range)  feeding supplement (ENSURE ENLIVE / ENSURE PLUS)  liquid 237 mL (0 mLs Oral Hold 07/31/22 1516)  ondansetron (ZOFRAN) injection 4 mg (has no administration in time range)  acetaminophen (TYLENOL) tablet 650 mg (has no administration in time range)  acetaminophen (TYLENOL) suppository 650 mg (has no administration in time range)  mometasone-formoterol (DULERA) 200-5 MCG/ACT inhaler 2 puff (has no administration in time range)  ondansetron (ZOFRAN) injection 4 mg (4 mg Intravenous Given 07/31/22 1241)  morphine (PF) 4 MG/ML injection 4 mg (4 mg Intravenous Given 07/31/22 1242)  lactated ringers bolus 1,000 mL (0 mLs Intravenous Stopped 07/31/22 1508)  iohexol (OMNIPAQUE) 350 MG/ML injection 75 mL (40 mLs Intravenous Contrast Given 07/31/22 1250)  lactated ringers bolus 1,000 mL (1,000 mLs Intravenous New Bag/Given 07/31/22 1512)     IMPRESSION / MDM / ASSESSMENT AND PLAN / ED COURSE  I reviewed the triage vital signs and the nursing notes.                              71 y.o. male with past medical history of metastatic squamous cell carcinoma of the tonsil and asthma who presents to the ED following syncopal episode in the oncology waiting room earlier today, was found to be hypotensive.  Patient's presentation is most consistent with acute presentation with potential threat to life or bodily function.  Differential diagnosis includes, but is not limited to, arrhythmia, ACS, PE, sepsis, pneumonia, cardiogenic shock, electrolyte abnormality, AKI.  Patient nontoxic-appearing and in no acute distress on my assessment, blood pressure appears to have improved without intervention.  EKG shows no evidence of arrhythmia or ischemia and initial labs are reassuring with no significant electrolyte abnormality, AKI, anemia, or leukocytosis.  No findings concerning for sepsis as patient denies any findings concerning for infectious process.  Doubt cardiogenic shock given improvement in vital signs and troponin within normal limits.  CT head was performed and  negative for acute process, does show enlarging known malignancy.  CTA of chest is negative for PE or other acute process, again demonstrates enlarging known malignancy.  Patient is interested in hospice care, but also states he would like to "know what is going on" with his passing out.  Case discussed with hospitalist for admission for further syncope workup as well as palliative care consultation.      FINAL CLINICAL IMPRESSION(S) / ED DIAGNOSES   Final diagnoses:  Syncope, unspecified syncope type  Primary squamous cell carcinoma of palatine tonsil ([place])     Rx / DC Orders   ED Discharge Orders     None        Note:  This document was prepared using Dragon voice recognition software and may include unintentional dictation  errors.   Blake Divine, MD 07/31/22 [phone]

## 2022-07-31 NOTE — ED Triage Notes (Signed)
First Nurse Note:  Arrives from the cancer center.  Patent with stage IV cancer and today had a syncopal / unresponsive episode at the cancer center waiting.  Per report BP was 68/p.  Port accessed and 1 L NS hung and 4 mg Zofran given.  Patient also with 2 week history of headache and falls.  Per report, patient needs a MRI as well today.

## 2022-07-31 NOTE — H&P (Signed)
History and Physical    Patrick Mendoza. ZOX:[id number] DOB: [birth year] DOA: 07/31/2022  Referring MD/NP/PA:   PCP: Sindy Guadeloupe, MD   Patient coming from:  The patient is coming from home.  At baseline, pt is independent for most of ADL.        Chief Complaint: syncope   HPI: Patrick Mendoza. is a 71 y.o. 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  Pt is status post 6 cycles of CarboTaxol chemotherapy followed by maintenance Keytruda until February 2023. Patient subsequently opted not to pursue further systemic chemotherapy despite repeat imaging showing disease progression. Per her daughter, patient has generalized weakness, multiple falls recently.  Patient was seen by her oncologist Dr. Janese Banks in clinic today, and had syncope in office.  Patient was sent to ED for further evaluation and treatment.  Patient states that she has moderate to severe pain in left face and headache. He has lightheadedness and dizziness.  He does not have unilateral numbness or tingling in extremities.  No facial droop or slurred speech.  Patient has mild dry cough, mild shortness breath, no chest pain fever or chills.  Patient has nausea and intermittently vomiting, denies abdominal pain or diarrhea.  Denies symptoms of UTI.  Patient has poor appetite and decreased oral intake.  Patient was found to have hypotension initially, with blood pressure 68/52 which improved to 114/67 after giving 1 L LR in ED.  Data reviewed independently and ED Course: pt was found to have WBC 10.0, troponin 6, GFR> 60, temperature normal, heart rate 79, RR 16, oxygen saturation 98% on room air.  CTA negative for PE.  CT of head is negative for acute intracranial abnormalities.  Patient is placed on telemetry bed for patient.  CT-head: 1. No evidence of acute intracranial abnormality. 2. Partially imaged right nasopharyngeal tumor, better  characterized on prior CT neck.  CTA: There is no evidence of pulmonary artery embolism. There is no evidence of thoracic aortic dissection. There are no focal pulmonary infiltrates. Extensive pulmonary and hepatic metastatic disease.    EKG: I have personally reviewed.  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   Review of Systems:   General: no fevers, chills, no body weight gain, has poor appetite, has fatigue HEENT: no blurry vision, hearing changes or sore throat Respiratory: has dyspnea, coughing, no wheezing CV: no chest pain, no palpitations GI: no nausea, vomiting, abdominal pain, diarrhea, constipation GU: no dysuria, burning on urination, increased urinary frequency, hematuria  Ext: no leg edema Neuro: no unilateral weakness, numbness, or tingling, no vision change or hearing loss. Has syncope and lightheadedness. Skin: no rash, no skin tear. MSK: No muscle spasm, no deformity, no limitation of range of movement in spin Heme: No easy bruising.  Travel history: No recent long distant travel.   Allergy: No Known Allergies  Past Medical History:  Diagnosis Date   Asthma    History of hiatal hernia    Inguinal hernia recurrent unilateral    Lung nodules    Metastasis to lung (HCC)    tonsil cancer and mets to lung   Tonsil cancer ([place])    Tonsil cancer ([place])     Past Surgical History:  Procedure Laterality Date   CERVICAL SPINE SURGERY     LUMBAR SPINE SURGERY     PORTA CATH INSERTION N/A 10/08/2019   Procedure: PORTA CATH INSERTION;  Surgeon: Katha Cabal, MD;  Location: [facility];  Service: Cardiovascular;  Laterality: N/A;   VIDEO BRONCHOSCOPY WITH ENDOBRONCHIAL NAVIGATION N/A 10/10/2019   Procedure: VIDEO BRONCHOSCOPY WITH ENDOBRONCHIAL NAVIGATION;  Surgeon: Tyler Pita, MD;  Location: [facility];  Service: Pulmonary;  Laterality: N/A;   VIDEO BRONCHOSCOPY WITH ENDOBRONCHIAL ULTRASOUND N/A 10/10/2019    Procedure: VIDEO BRONCHOSCOPY WITH ENDOBRONCHIAL ULTRASOUND;  Surgeon: Tyler Pita, MD;  Location: [facility];  Service: Pulmonary;  Laterality: N/A;    Social History:  reports that he has never smoked. He has never used smokeless tobacco. He reports that he does not drink alcohol and does not use drugs.  Family History:  Family History  Problem Relation Age of Onset   Cancer Mother 40       not sure what type   Prostate cancer Father    Rheum arthritis Father    Healthy Sister    Healthy Brother    Healthy Sister    Healthy Brother    Healthy Brother    Heart disease Brother    Heart disease Brother      Prior to Admission medications   Medication Sig Start Date End Date Taking? Authorizing Provider  acetaminophen (TYLENOL) 500 MG tablet Take 2,000 mg by mouth 3 (three) times daily as needed for moderate pain.    [provider]  EPINEPHrine (PRIMATENE MIST) 0.125 MG/ACT AERO Inhale 1 puff into the lungs daily as needed (shortness of breath).    [provider]  ibuprofen (ADVIL) 800 MG tablet Take 1 tablet (800 mg total) by mouth every 8 (eight) hours as needed. 08/05/21   Ronny Bacon, MD  Pembrolizumab [facility] IV) Inject 1 Dose into the vein See admin instructions. Every 3rd Tuesday Patient not taking: Reported on 01/24/2022    [provider]  [facility] 160-4.5 MCG/ACT inhaler Inhale 2 puffs by mouth twice daily 06/19/22   Verlon Au, NP    Physical Exam: Vitals:   07/31/22 1132 07/31/22 1145 07/31/22 1200 07/31/22 1230  BP:  95/73 108/65 114/67  Pulse:  69 68 73  Resp:    18  Temp:    98 F (36.7 C)  TempSrc:    Oral  SpO2:  99% 98% 98%  Weight: 65.8 kg     Height: 5\' 10"  (1.778 m)      General: Not in acute distress.  Dry mucous membranes HEENT:       Eyes: PERRL, EOMI, no scleral icterus.       ENT: No discharge from the ears and nose, no pharynx injection, no tonsillar enlargement.        Neck: No JVD, no bruit, no  mass felt. Heme: No neck lymph node enlargement. Cardiac: S1/S2, RRR, No murmurs, No gallops or rubs. Respiratory: No rales, wheezing, rhonchi or rubs. GI: Soft, nondistended, nontender, no rebound pain, no organomegaly, BS present. GU: No hematuria Ext: No pitting leg edema bilaterally. 1+DP/PT pulse bilaterally. Musculoskeletal: No joint deformities, No joint redness or warmth, no limitation of ROM in spin. Skin: No rashes.  Neuro: Alert, oriented X3, cranial nerves II-XII grossly intact, moves all extremities normally.  Psych: Patient is not psychotic, no suicidal or hemocidal ideation.  Labs on Admission: I have personally reviewed following labs and imaging studies  CBC: Recent Labs  Lab 07/31/22 1008 07/31/22 1234  WBC 8.2 10.0  NEUTROABS 6.6 8.5*  HGB 11.7* 10.9*  HCT 35.8* 34.0*  MCV 96.2 98.0  PLT 391 369  Basic Metabolic Panel: Recent Labs  Lab 07/27/22 0814 07/31/22 1008 07/31/22 1234  NA  --  136 137  K  --  3.6 4.3  CL  --  99 102  CO2  --  22 25  GLUCOSE  --  133* 94  BUN  --  18 20  CREATININE 0.90 0.86 0.86  CALCIUM  --  9.1 9.1   GFR: Estimated Creatinine Clearance: 75.4 mL/min (by C-G formula based on SCr of 0.86 mg/dL). Liver Function Tests: Recent Labs  Lab 07/31/22 1008 07/31/22 1234  AST 57* 51*  ALT 20 17  ALKPHOS 162* 166*  BILITOT 1.0 1.1  PROT 7.5 7.3  ALBUMIN 3.6 3.3*   No results for input(s): "LIPASE", "AMYLASE" in the last 168 hours. No results for input(s): "AMMONIA" in the last 168 hours. Coagulation Profile: No results for input(s): "INR", "PROTIME" in the last 168 hours. Cardiac Enzymes: No results for input(s): "CKTOTAL", "CKMB", "CKMBINDEX", "TROPONINI" in the last 168 hours. BNP (last 3 results) No results for input(s): "PROBNP" in the last 8760 hours. HbA1C: No results for input(s): "HGBA1C" in the last 72 hours. CBG: No results for input(s): "GLUCAP" in the last 168 hours. Lipid Profile: No results for  input(s): "CHOL", "HDL", "LDLCALC", "TRIG", "CHOLHDL", "LDLDIRECT" in the last 72 hours. Thyroid Function Tests: No results for input(s): "TSH", "T4TOTAL", "FREET4", "T3FREE", "THYROIDAB" in the last 72 hours. Anemia Panel: No results for input(s): "VITAMINB12", "FOLATE", "FERRITIN", "TIBC", "IRON", "RETICCTPCT" in the last 72 hours. Urine analysis:    Component Value Date/Time   COLORURINE Yellow 01/19/2013 1321   APPEARANCEUR Clear 01/19/2013 1321   LABSPEC 1.020 01/19/2013 1321   PHURINE 5.0 01/19/2013 1321   GLUCOSEU Negative 01/19/2013 1321   HGBUR Negative 01/19/2013 1321   BILIRUBINUR Negative 01/19/2013 1321   KETONESUR Negative 01/19/2013 1321   PROTEINUR Negative 01/19/2013 1321   NITRITE Negative 01/19/2013 1321   LEUKOCYTESUR 3+ 01/19/2013 1321   Sepsis Labs: @LABRCNTIP (procalcitonin:4,lacticidven:4) )No results found for this or any previous visit (from the past 240 hour(s)).   Radiological Exams on Admission: CT Angio Chest PE W/Cm &/Or Wo Cm  Result Date: 07/31/2022 CLINICAL DATA:  High clinical suspicion for PE, possible seizures EXAM: CT ANGIOGRAPHY CHEST WITH CONTRAST TECHNIQUE: Multidetector CT imaging of the chest was performed using the standard protocol during bolus administration of intravenous contrast. Multiplanar CT image reconstructions and MIPs were obtained to evaluate the vascular anatomy. RADIATION DOSE REDUCTION: This exam was performed according to the departmental dose-optimization program which includes automated exposure control, adjustment of the mA and/or kV according to patient size and/or use of iterative reconstruction technique. CONTRAST:  71mL OMNIPAQUE IOHEXOL 350 MG/ML SOLN COMPARISON:  07/27/2022 FINDINGS: Cardiovascular: There are no intraluminal filling defects in pulmonary artery branches. There is homogeneous enhancement in thoracic aorta. There is ectasia of ascending thoracic aorta measuring 3.6 cm. Mediastinum/Nodes: There are enlarged  lymph nodes in left hilum. There is soft tissue density structure in subcarinal region which may suggest prominent pericardial recess or enlarged lymph node. Lungs/Pleura: There are multiple noncalcified nodules of varying sizes in both lungs with no significant interval change. There is no focal pulmonary consolidation. There is no pleural effusion or pneumothorax. Upper Abdomen: There are multiple low-density space-occupying lesions of varying sizes in liver measuring up to 5.7 cm in size suggesting hepatic metastatic disease. Evaluation is limited in this study done during arterial phase. Subcentimeter nodular density in left adrenal has not changed. Musculoskeletal: No acute findings are seen. Review  of the MIP images confirms the above findings. IMPRESSION: There is no evidence of pulmonary artery embolism. There is no evidence of thoracic aortic dissection. There are no focal pulmonary infiltrates. Extensive pulmonary and hepatic metastatic disease. Electronically Signed   By: Elmer Picker M.D.   On: 07/31/2022 13:21   CT Head Wo Contrast  Result Date: 07/31/2022 CLINICAL DATA:  Headache, increasing frequency or severity EXAM: CT HEAD WITHOUT CONTRAST TECHNIQUE: Contiguous axial images were obtained from the base of the skull through the vertex without intravenous contrast. RADIATION DOSE REDUCTION: This exam was performed according to the departmental dose-optimization program which includes automated exposure control, adjustment of the mA and/or kV according to patient size and/or use of iterative reconstruction technique. COMPARISON:  None Available. FINDINGS: Brain: No evidence of acute infarction, hemorrhage, hydrocephalus, extra-axial collection or mass lesion/mass effect. Vascular: No hyperdense vessel identified. Skull: No acute fracture. Sinuses/Orbits: Scattered paranasal sinus mucosal thickening. No acute orbital findings. Other: Partially imaged right nasopharyngeal tumor, better  characterized on prior CT neck. IMPRESSION: 1. No evidence of acute intracranial abnormality. 2. Partially imaged right nasopharyngeal tumor, better characterized on prior CT neck. Electronically Signed   By: Margaretha Sheffield M.D.   On: 07/31/2022 13:11      Assessment/Plan Principal Problem:   Syncope Active Problems:   Hypotension   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)   Asthma   Fall at home, initial encounter   Severe protein-calorie malnutrition ([place])   Assessment and Plan:  Syncope: Possibly due to hypotension.  Blood pressure was 68/52, which improved to 114/67.  This is secondary to poor appetite and decreased oral intake.  Patient also reports headache, cannot rule out metastasized brain disease.  I spent lengthy time to have discussed with the patient about her goals of care in the presence of his daughter.  Patient clearly states that he does not want to do any aggressive systemic treatment. 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  He does not want to further image of brain.  He wants to have home hospice care.  -Place on telemetry bed for observation -check orthostatic vital signs -Frequent neurocheck -Fall precaution -IV fluid: 2 L LR  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) -palliative consult -prn Dilaudid, Percocet, Tylenol for pain  Asthma -Bronchodilators  Fall at home, initial encounter -Fall precaution -PT/OT  Severe protein-calorie malnutrition ([place]) -Start Ensure    DVT ppx: SQ Lovenox  Code Status: DNR per pt and his daughter  Family Communication:    Yes, patient's daughter  at bed side.   Disposition Plan:  Anticipate discharge back to previous environment  Consults called:  none  Admission status and Level of care: Telemetry Medical:    for obs    Dispo: The patient is from: Home              Anticipated d/c is to:  possible home hospice              Anticipated d/c date is: 1 day              Patient currently is not medically stable to d/c.    Severity of Illness:  The appropriate patient status for this patient is OBSERVATION. Observation status is judged to be reasonable and necessary in order to provide the required intensity of service to ensure the patient's safety. The patient's presenting symptoms,  physical exam findings, and initial radiographic and laboratory data in the context of their medical condition is felt to place them at decreased risk for further clinical deterioration. Furthermore, it is anticipated that the patient will be medically stable for discharge from the hospital within 2 midnights of admission.        Date of Service 07/31/2022    Ivor Costa [facility]   If 7PM-7AM, please contact night-coverage [email or web address] 07/31/2022, 3:43 PM

## 2022-07-31 NOTE — ED Triage Notes (Signed)
See initial note upon arrival; daughter states pt's pupils "dilated" and pt became unresponsive with what she thought was a "seizure"; states pt came-to quickly; pt's pupils currently equal round and reactive; pt states inc HA's over last couple months; few falls one in which he tripped and the others he is unsure what caused them; port to R chest remains accessed.

## 2022-07-31 NOTE — Progress Notes (Signed)
[place] [facility] Telephone:([phone]) [phone] Fax:([phone]) [phone]   Name: Patrick Mendoza. Date: 07/31/2022 MRN: [id number]  DOB: [birth year]  Patient Care Team: Sindy Guadeloupe, MD as PCP - General (Oncology) Sindy Guadeloupe, MD as Consulting Physician (Oncology)    REASON FOR CONSULTATION: Patrick Mendoza. is a 71 y.o. 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  Patient subsequently opted not to pursue further systemic chemotherapy despite repeat imaging showing disease progression.  Palliative care was consulted to help address goals.  SOCIAL HISTORY:     reports that he has never smoked. He has never used smokeless tobacco. He reports that he does not drink alcohol and does not use drugs.  Patient is a widower.  He lives at home alone but his son lives next-door.  He has a daughter who lives nearby and another son who lives at [place].  Patient worked as a Administrator.  ADVANCE DIRECTIVES:  Does not have  CODE STATUS: DNR  PAST MEDICAL HISTORY: Past Medical History:  Diagnosis Date   Asthma    History of hiatal hernia    Inguinal hernia recurrent unilateral    Lung nodules    Metastasis to lung (HCC)    tonsil cancer and mets to lung   Tonsil cancer ([place])    Tonsil cancer ([place])     PAST SURGICAL HISTORY:  Past Surgical History:  Procedure Laterality Date   CERVICAL SPINE SURGERY     LUMBAR SPINE SURGERY     PORTA CATH INSERTION N/A 10/08/2019   Procedure: PORTA CATH INSERTION;  Surgeon: Katha Cabal, MD;  Location: [facility];  Service: Cardiovascular;  Laterality: N/A;   VIDEO BRONCHOSCOPY WITH ENDOBRONCHIAL NAVIGATION N/A 10/10/2019   Procedure: VIDEO BRONCHOSCOPY WITH ENDOBRONCHIAL NAVIGATION;  Surgeon: Tyler Pita, MD;  Location:  [facility];  Service: Pulmonary;  Laterality: N/A;   VIDEO BRONCHOSCOPY WITH ENDOBRONCHIAL ULTRASOUND N/A 10/10/2019   Procedure: VIDEO BRONCHOSCOPY WITH ENDOBRONCHIAL ULTRASOUND;  Surgeon: Tyler Pita, MD;  Location: [facility];  Service: Pulmonary;  Laterality: N/A;    HEMATOLOGY/ONCOLOGY HISTORY:  Oncology History  Primary squamous cell carcinoma of palatine tonsil ([place])  09/23/2019 Initial Diagnosis   Primary squamous cell carcinoma of palatine tonsil ([place])   10/13/2019 - 10/18/2021 Chemotherapy   Patient is on Treatment Plan : head and neck Carboplatin + Paclitaxel + Pembrolizumab q21d x 6 cycles     10/01/2020 Cancer Staging   Staging form: Pharynx - HPV-Mediated Oropharynx, AJCC 8th Edition - Clinical stage from 10/01/2020: Stage IV (cT3, cN1, pM1, p16+) - Signed by Sindy Guadeloupe, MD on 12/07/2020     ALLERGIES:  has No Known Allergies.  MEDICATIONS:  Current Outpatient Medications  Medication Sig Dispense Refill   acetaminophen (TYLENOL) 500 MG tablet Take 2,000 mg by mouth 3 (three) times daily as needed for moderate pain.     EPINEPHrine (PRIMATENE MIST) 0.125 MG/ACT AERO Inhale 1 puff into the lungs daily as needed (shortness of breath).     ibuprofen (ADVIL) 800 MG tablet Take 1 tablet (800 mg total) by mouth every 8 (eight) hours as needed. 30 tablet 0   Pembrolizumab (KEYTRUDA IV) Inject 1 Dose into the vein See admin instructions. Every 3rd Tuesday (Patient not taking: Reported on 01/24/2022)     SYMBICORT  160-4.5 MCG/ACT inhaler Inhale 2 puffs by mouth twice daily 11 g 0   No current facility-administered medications for this visit.   Facility-Administered Medications Ordered in Other Visits  Medication Dose Route Frequency Provider Last Rate Last Admin   sodium chloride flush (NS) 0.9 % injection 10 mL  10 mL Intravenous PRN Sindy Guadeloupe, MD   10 mL at 03/09/20 0919    VITAL SIGNS: There were no vitals taken for this visit. There were no vitals filed for this  visit.  Estimated body mass index is 26.83 kg/m as calculated from the following:   Height as of 11/25/21: 5\' 10"  (1.778 m).   Weight as of 04/25/22: 187 lb (84.8 kg).  LABS: CBC:    Component Value Date/Time   WBC 8.2 07/31/2022 1008   HGB 11.7 (L) 07/31/2022 1008   HGB 15.4 01/19/2013 1320   HCT 35.8 (L) 07/31/2022 1008   HCT 44.6 01/19/2013 1320   PLT 391 07/31/2022 1008   PLT 344 01/19/2013 1320   MCV 96.2 07/31/2022 1008   MCV 90 01/19/2013 1320   NEUTROABS 6.6 07/31/2022 1008   NEUTROABS 5.8 01/19/2013 1320   LYMPHSABS 0.8 07/31/2022 1008   LYMPHSABS 1.7 01/19/2013 1320   MONOABS 0.7 07/31/2022 1008   MONOABS 0.7 01/19/2013 1320   EOSABS 0.1 07/31/2022 1008   EOSABS 0.8 (H) 01/19/2013 1320   BASOSABS 0.0 07/31/2022 1008   BASOSABS 0.1 01/19/2013 1320   Comprehensive Metabolic Panel:    Component Value Date/Time   NA 136 07/31/2022 1008   NA 138 01/19/2013 1320   K 3.6 07/31/2022 1008   K 4.6 01/19/2013 1320   CL 99 07/31/2022 1008   CL 104 01/19/2013 1320   CO2 22 07/31/2022 1008   CO2 30 01/19/2013 1320   BUN 18 07/31/2022 1008   BUN 15 01/19/2013 1320   CREATININE 0.86 07/31/2022 1008   CREATININE 1.05 01/19/2013 1320   GLUCOSE 133 (H) 07/31/2022 1008   GLUCOSE 96 01/19/2013 1320   CALCIUM 9.1 07/31/2022 1008   CALCIUM 9.0 01/19/2013 1320   AST 57 (H) 07/31/2022 1008   AST 20 01/19/2013 1320   ALT 20 07/31/2022 1008   ALT 17 01/19/2013 1320   ALKPHOS 162 (H) 07/31/2022 1008   ALKPHOS 84 01/19/2013 1320   BILITOT 1.0 07/31/2022 1008   BILITOT 0.5 01/19/2013 1320   PROT 7.5 07/31/2022 1008   PROT 7.8 01/19/2013 1320   ALBUMIN 3.6 07/31/2022 1008   ALBUMIN 3.9 01/19/2013 1320    RADIOGRAPHIC STUDIES: CT SOFT TISSUE NECK W CONTRAST  Result Date: 07/27/2022 CLINICAL DATA:  History of stage IV squamous cell tonsillar carcinoma. A EXAM: CT NECK WITH CONTRAST TECHNIQUE: Multidetector CT imaging of the neck was performed using the standard protocol  following the bolus administration of intravenous contrast. RADIATION DOSE REDUCTION: This exam was performed according to the departmental dose-optimization program which includes automated exposure control, adjustment of the mA and/or kV according to patient size and/or use of iterative reconstruction technique. CONTRAST:  123mL OMNIPAQUE IOHEXOL 300 MG/ML  SOLN COMPARISON:  CT of the neck 04/19/2022. PET scan 01/19/2022. CT neck 11/22/2021. FINDINGS: Pharynx and larynx: Progressive enhancing tumor extends cephalo caudad from the nasopharynx the posterior oropharynx measuring 7.2 cm. For extensive heterogeneous enhancement the soft palate is present. Tumor enhancement extends across midline in the posterior oropharynx measuring 4.5 cm in transverse dimension. Bulky tumor is present in the posterior left oropharynx measuring 1.9 cm anterior-posterior. Salivary glands: The submandibular and  parotid glands and ducts are within normal limits. Thyroid: Normal Lymph nodes: Progressive bilateral malignant adenopathy is present. A right lateral retropharyngeal node demonstrates continued growth, now measuring 2.1 x 1.3 cm. Heterogeneous enlarged left level 2 lymph nodes have progressed. The largest node now measures 3.7 x 2.2 x 2.6 cm. Adjacent superior node or lobulation of this node measures an additional 21 mm. The largest right level 2 lymph node measures 2.8 x 1.8 x 1.8 cm. Based rounded left level 3 and level 4 lymph nodes have also increased in size. Peripherally enhancing right level 3 lymph nodes have increased in size, the largest now measuring 11 x 6 mm. Vascular: No significant vascular lesions are present. Limited intracranial: Within normal limits. Visualized orbits: The globes and orbits are within normal limits. Mastoids and visualized paranasal sinuses: Fluid is present in the right lateral recess scratched at fluid is present in the lateral recess the right sphenoid sinus. A posterior left ethmoid air  cells opacified. Scattered mucosal thickening is present throughout ethmoid air cells bilaterally. A polyp or mucous retention cyst is present in the right frontoethmoidal recess. Mild mucosal thickening is present in the inferior right maxillary sinus. Mastoid air cells are clear. Skeleton: Degenerative changes of the lower cervical spine are again noted with disease greatest at C6-7, left greater than right. 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 Upper chest: Multiple bilateral pulmonary nodules are present, better described on chest CT of the same day. IMPRESSION: 1. Progressive enhancing tumor extending from the nasopharynx to the posterior oropharynx, predominantly on the right. Tumor is progressed across midline in the oropharynx. 2. Progressive bilateral malignant adenopathy. 3. Multiple bilateral pulmonary nodules, better described on chest CT of the same day. Electronically Signed   By: San Morelle M.D.   On: 07/27/2022 16:18   CT CHEST ABDOMEN PELVIS W CONTRAST  Result Date: 07/27/2022 CLINICAL DATA:  History of stage IV squamous cell tonsillar cancer. Restaging/follow-up. * Tracking Code: BO * EXAM: CT CHEST, ABDOMEN, AND PELVIS WITH CONTRAST TECHNIQUE: Multidetector CT imaging of the chest, abdomen and pelvis was performed following the standard protocol during bolus administration of intravenous contrast. RADIATION DOSE REDUCTION: This exam was performed according to the departmental dose-optimization program which includes automated exposure control, adjustment of the mA and/or kV according to patient size and/or use of iterative reconstruction technique. CONTRAST:  133mL OMNIPAQUE IOHEXOL 300 MG/ML  SOLN COMPARISON:  Multiple priors including most recent CT April 19, 2022. FINDINGS: CT CHEST FINDINGS Cardiovascular: Right chest wall Port-A-Cath with tip at the superior cavoatrial junction. Aortic atherosclerosis. No central pulmonary embolus on this nondedicated study. Normal size heart.  No significant pericardial effusion/thickening. Mediastinum/Nodes: No suspicious thyroid nodule. New enlarged left hilar lymph node measures 12 mm on image 43/4. The esophagus is grossly unremarkable. Lungs/Pleura: Significant increase in size and number of the numerous bilateral pulmonary metastases. Indexed nodules are as follows: -right upper lobe pulmonary nodule measures 9 mm on image 80/6 previously 5 mm. -left upper lobe pulmonary nodule measures 10 mm on image 92/6 previously 5 mm. -right lower lobe pulmonary nodule measures 15 mm on image 131/6 previously 7 mm. No pleural effusion.  No pneumothorax. Musculoskeletal: Multilevel degenerative changes spine. No aggressive lytic or blastic lesion of bone. CT ABDOMEN PELVIS FINDINGS Hepatobiliary: Increase in size and number of the bilobar hepatic metastatic lesions. Index lesions are as follows: -dominant lesion in the left right lobe of the liver now measures 8.7 x 7.8 cm on image 73/4 previously 4.1 x  4.1 cm. -lateral left lobe of the liver lesion measures 8.2 x 6.2 cm on image 67/4 previously 2.5 x 2.2 cm when remeasured for consistency. Gallbladder is distended without evidence of acute inflammation. No biliary ductal dilation. Pancreas: No pancreatic ductal dilation or evidence of acute inflammation. Spleen: No splenomegaly. Adrenals/Urinary Tract: Subtle 7 mm nodular focus in the left adrenal gland on image 71/4 is stable dating back to at least July 02, 2020 and was not abnormally hypermetabolic on prior PET-CT likely reflecting a tiny adenoma. Right adrenal gland appears normal. Stable benign right renal cyst. Nonobstructive bilateral renal stones. Urinary bladder is unremarkable for degree of distension. Stomach/Bowel: Stomach is unremarkable for degree of distension. No pathologic dilation of small or large bowel. Colonic diverticulosis without findings of acute diverticulitis. Vascular/Lymphatic: Normal caliber abdominal aorta. No pathologically  enlarged abdominal or pelvic lymph nodes. Reproductive: Dystrophic calcifications in an enlarged prostate gland. Other: No significant abdominal free fluid. Musculoskeletal: No aggressive lytic or blastic lesion of bone. Multilevel degenerative changes spine. Degenerative changes of the bilateral hips. IMPRESSION: 1. Progression of disease with increased burden of bilateral pulmonary metastases and bilobar hepatic metastatic lesions as well as a new enlarged left hilar lymph node. 2. No evidence of metastatic disease in the abdomen or pelvis. 3. Please refer to same day CT of the neck for findings above the clavicles. 4. Colonic diverticulosis without findings of acute diverticulitis. 5. Nonobstructive bilateral renal stones. 6. Subtle 7 mm nodular focus in the left adrenal gland is stable dating back to at least July 02, 2020 and was not abnormally hypermetabolic on prior PET-CT likely reflecting a tiny adenoma, attention on follow-up imaging suggested. 7.  Aortic Atherosclerosis (ICD10-I70.0). Electronically Signed   By: Dahlia Bailiff M.D.   On: 07/27/2022 13:24    PERFORMANCE STATUS (ECOG) : 3 - Symptomatic, >50% confined to bed  Review of Systems Unless otherwise noted, a complete review of systems is negative.  Physical Exam General: NAD Cardiovascular: regular rate and rhythm Pulmonary: clear ant fields Abdomen: soft, nontender, + bowel sounds GU: no suprapubic tenderness Extremities: no edema, no joint deformities Skin: no rashes Neurological: Weakness but otherwise nonfocal  IMPRESSION: Patient was an add-on to my clinic schedule today at Dr. Elroy Channel request.  Patient presented into the clinic with 2 weeks of headaches with recurrent falls.  He reportedly had nausea and vomiting followed by loss of consciousness in our lobby with note of significant hypotension with blood pressure 68/52.  Recent CTs of the chest, abdomen, pelvis, and neck all reveal significant disease progression in  the neck, lungs, liver.  I spoke with patient and daughter regarding goals.  Both verbalized understanding that his cancer is progressing.  Patient previously decided not to pursue further cancer treatment and he is consistent with his wishes again today.  We discussed that he is likely nearing end-of-life.  He is quite symptomatic today with nausea, vomiting, pain.  We discussed options of managing symptoms in clinic versus hospitalization versus hospice care at home or  hospice IPU.  Ultimately, patient stated his preference for short-term hospitalization and then would like to go home with hospice care.  Patient would benefit from MRI of the brain given new onset neurological symptoms.  Patient stated clearly that he would not want to be resuscitated or have his life prolonged artificially on machines.  Both he and daughter were in agreement DNR/DNI.  Daughter would be interested in establishing HCPOA documents if possible.  IV fluids started.  Patient  given ondansetron 4 mg IV x 1.  He was then transported to the ED with report given to triage and charge nurse.  PLAN: -Transfer to the ED -Patient is interested in hospice care following hospitalization -Recommend MRI of the brain -IV fluids started -Zofran 4 mg x 1 -DNR/DNI  Case and plan discussed with Dr. Janese Banks who also saw patient today.   Patient expressed understanding and was in agreement with this plan. He also understands that He can call the clinic at any time with any questions, concerns, or complaints.     Time Total: 30 minutes  Visit consisted of counseling and education dealing with the complex and emotionally intense issues of symptom management and palliative care in the setting of serious and potentially life-threatening illness.Greater than 50%  of this time was spent counseling and coordinating care related to the above assessment and plan.  Signed by: Altha Harm, PhD, NP-C

## 2022-07-31 NOTE — Progress Notes (Signed)
Pt states he has been experiencing lots of dizziness recently. Nausea started this morning pt was very dizzy in lobby and vomited in room.

## 2022-08-01 DIAGNOSIS — G893 Neoplasm related pain (acute) (chronic): Secondary | ICD-10-CM | POA: Diagnosis not present

## 2022-08-01 DIAGNOSIS — R55 Syncope and collapse: Secondary | ICD-10-CM | POA: Diagnosis not present

## 2022-08-01 DIAGNOSIS — J4520 Mild intermittent asthma, uncomplicated: Secondary | ICD-10-CM | POA: Diagnosis not present

## 2022-08-01 DIAGNOSIS — E43 Unspecified severe protein-calorie malnutrition: Secondary | ICD-10-CM

## 2022-08-01 DIAGNOSIS — I959 Hypotension, unspecified: Secondary | ICD-10-CM

## 2022-08-01 DIAGNOSIS — C099 Malignant neoplasm of tonsil, unspecified: Secondary | ICD-10-CM | POA: Diagnosis not present

## 2022-08-01 DIAGNOSIS — Y92009 Unspecified place in unspecified non-institutional (private) residence as the place of occurrence of the external cause: Secondary | ICD-10-CM

## 2022-08-01 DIAGNOSIS — Z515 Encounter for palliative care: Secondary | ICD-10-CM

## 2022-08-01 DIAGNOSIS — W19XXXA Unspecified fall, initial encounter: Secondary | ICD-10-CM

## 2022-08-01 LAB — CBC
HCT: 29.9 % — ABNORMAL LOW (ref 39.0–52.0)
Hemoglobin: 9.5 g/dL — ABNORMAL LOW (ref 13.0–17.0)
MCH: 30.9 pg (ref 26.0–34.0)
MCHC: 31.8 g/dL (ref 30.0–36.0)
MCV: 97.4 fL (ref 80.0–100.0)
Platelets: 302 10*3/uL (ref 150–400)
RBC: 3.07 MIL/uL — ABNORMAL LOW (ref 4.22–5.81)
RDW: 13.3 % (ref 11.5–15.5)
WBC: 7.8 10*3/uL (ref 4.0–10.5)
nRBC: 0 % (ref 0.0–0.2)

## 2022-08-01 LAB — BASIC METABOLIC PANEL
Anion gap: 8 (ref 5–15)
BUN: 11 mg/dL (ref 8–23)
CO2: 25 mmol/L (ref 22–32)
Calcium: 8.5 mg/dL — ABNORMAL LOW (ref 8.9–10.3)
Chloride: 104 mmol/L (ref 98–111)
Creatinine, Ser: 0.67 mg/dL (ref 0.61–1.24)
GFR, Estimated: >60 mL/min (ref >60)
Glucose, Bld: 82 mg/dL (ref 70–99)
Potassium: 3.6 mmol/L (ref 3.5–5.1)
Sodium: 137 mmol/L (ref 135–145)

## 2022-08-01 LAB — HIV ANTIBODY (ROUTINE TESTING W REFLEX): HIV Screen 4th Generation wRfx: NONREACTIVE

## 2022-08-01 MED ORDER — BUTALBITAL-APAP-CAFFEINE 50-325-40 MG PO TABS
1.0000 | ORAL_TABLET | Freq: Once | ORAL | Status: AC
  Administered 2022-08-01: 1 via ORAL
  Filled 2022-08-01: qty 1

## 2022-08-01 MED ORDER — HEPARIN SOD (PORK) LOCK FLUSH 100 UNIT/ML IV SOLN
500.0000 [IU] | Freq: Once | INTRAVENOUS | Status: DC
  Filled 2022-08-01: qty 5

## 2022-08-01 MED ORDER — GABAPENTIN 100 MG PO CAPS
100.0000 mg | ORAL_CAPSULE | Freq: Two times a day (BID) | ORAL | Status: DC
  Administered 2022-08-01: 100 mg via ORAL
  Filled 2022-08-01: qty 1

## 2022-08-01 MED ORDER — DM-GUAIFENESIN ER 30-600 MG PO TB12: 1.0000 | ORAL_TABLET | Freq: Two times a day (BID) | ORAL | 0 refills | Status: DC | PRN

## 2022-08-01 MED ORDER — KETOROLAC TROMETHAMINE 30 MG/ML IJ SOLN
30.0000 mg | Freq: Once | INTRAMUSCULAR | Status: AC
  Administered 2022-08-01: 30 mg via INTRAVENOUS
  Filled 2022-08-01: qty 1

## 2022-08-01 MED ORDER — BUTALBITAL-APAP-CAFFEINE 50-325-40 MG PO TABS: 1.0000 | ORAL_TABLET | Freq: Four times a day (QID) | ORAL | 0 refills | Status: DC | PRN

## 2022-08-01 MED ORDER — ENSURE ENLIVE PO LIQD: 237.0000 mL | Freq: Two times a day (BID) | ORAL | 12 refills | Status: DC

## 2022-08-01 MED ORDER — OXYCODONE HCL 5 MG PO TABS: 5.0000 mg | ORAL_TABLET | ORAL | 0 refills | Status: DC | PRN

## 2022-08-01 MED ORDER — GABAPENTIN 100 MG PO CAPS: 100.0000 mg | ORAL_CAPSULE | Freq: Two times a day (BID) | ORAL | 0 refills | Status: DC

## 2022-08-01 MED ORDER — OXYCODONE HCL 5 MG PO TABS: 5.0000 mg | ORAL_TABLET | ORAL | Status: DC | PRN

## 2022-08-01 NOTE — TOC Progression Note (Addendum)
Transition of Care (TOC) - Progression Note    Patient Details  Name: Patrick Mendoza. MRN: [id number] Date of Birth: 01-Sep-1950  Transition of Care [facility]) CM/SW [place], [place] Phone Number: 08/01/2022, 12:17 PM  Clinical Narrative:      TOC contacted [facility] and they will meet with the patient and family regarding their program. Patient lives at home alone and his son lives next door. Daughter lives nearby.      Expected Discharge Plan and [place]                                         Social Determinants of Health (SDOH) Interventions    Readmission Risk Interventions     No data to display

## 2022-08-01 NOTE — Consult Note (Signed)
[place] at [facility] Telephone:([phone]) [phone] Fax:([phone]) [phone]   Name: Patrick Mendoza. Date: 08/01/2022 MRN: [id number]  DOB: 15-Aug-1951  Patient Care Team: Sindy Guadeloupe, MD as PCP - General (Oncology) Sindy Guadeloupe, MD as Consulting Physician (Oncology)    REASON FOR CONSULTATION: Patrick Lembo. is a 71 y.o. 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  Patient subsequently opted not to pursue further systemic chemotherapy despite repeat imaging showing disease progression.  Patient was admitted to the hospital on 07/31/2022 with hypotension and possible syncope.  Palliative care was consulted to help address goals. .   SOCIAL HISTORY:     reports that he has never smoked. He has never used smokeless tobacco. He reports that he does not drink alcohol and does not use drugs.  Patient is a widower.  He lives at home alone but his son lives next-door.  He has a daughter who lives nearby and another son who lives at [place].  Patient worked as a Administrator.   ADVANCE DIRECTIVES:  Does not have   CODE STATUS: DNR  PAST MEDICAL HISTORY: Past Medical History:  Diagnosis Date   Asthma    History of hiatal hernia    Inguinal hernia recurrent unilateral    Lung nodules    Metastasis to lung (HCC)    tonsil cancer and mets to lung   Tonsil cancer ([place])    Tonsil cancer ([place])     PAST SURGICAL HISTORY:  Past Surgical History:  Procedure Laterality Date   CERVICAL SPINE SURGERY     LUMBAR SPINE SURGERY     PORTA CATH INSERTION N/A 10/08/2019   Procedure: PORTA CATH INSERTION;  Surgeon: Katha Cabal, MD;  Location: [facility];  Service: Cardiovascular;  Laterality: N/A;   VIDEO BRONCHOSCOPY WITH ENDOBRONCHIAL NAVIGATION N/A 10/10/2019   Procedure:  VIDEO BRONCHOSCOPY WITH ENDOBRONCHIAL NAVIGATION;  Surgeon: Tyler Pita, MD;  Location: [facility];  Service: Pulmonary;  Laterality: N/A;   VIDEO BRONCHOSCOPY WITH ENDOBRONCHIAL ULTRASOUND N/A 10/10/2019   Procedure: VIDEO BRONCHOSCOPY WITH ENDOBRONCHIAL ULTRASOUND;  Surgeon: Tyler Pita, MD;  Location: [facility];  Service: Pulmonary;  Laterality: N/A;    HEMATOLOGY/ONCOLOGY HISTORY:  Oncology History  Primary squamous cell carcinoma of palatine tonsil ([place])  09/23/2019 Initial Diagnosis   Primary squamous cell carcinoma of palatine tonsil ([place])   10/13/2019 - 10/18/2021 Chemotherapy   Patient is on Treatment Plan : head and neck Carboplatin + Paclitaxel + Pembrolizumab q21d x 6 cycles     10/01/2020 Cancer Staging   Staging form: Pharynx - HPV-Mediated Oropharynx, AJCC 8th Edition - Clinical stage from 10/01/2020: Stage IV (cT3, cN1, pM1, p16+) - Signed by Sindy Guadeloupe, MD on 12/07/2020     ALLERGIES:  has No Known Allergies.  MEDICATIONS:  Current Facility-Administered Medications  Medication Dose Route Frequency Provider Last Rate Last Admin   acetaminophen (TYLENOL) suppository 650 mg  650 mg Rectal Q6H PRN Ivor Costa, MD       acetaminophen (TYLENOL) tablet 650 mg  650 mg Oral Q6H PRN Ivor Costa, MD   650 mg at 08/01/22 1014   albuterol (PROVENTIL) (2.5 MG/3ML) 0.083% nebulizer solution 3 mL  3 mL Inhalation Q4H PRN Ivor Costa, MD       dextromethorphan-guaiFENesin [facility] DM) 30-600 MG  per 12 hr tablet 1 tablet  1 tablet Oral BID PRN Ivor Costa, MD       enoxaparin (LOVENOX) injection 40 mg  40 mg Subcutaneous Q24H Ivor Costa, MD   40 mg at 07/31/22 2152   feeding supplement (ENSURE ENLIVE / ENSURE PLUS) liquid 237 mL  237 mL Oral BID BM Ivor Costa, MD       HYDROmorphone (DILAUDID) injection 1 mg  1 mg Intravenous Q3H PRN Ivor Costa, MD   1 mg at 08/01/22 0324   mometasone-formoterol (DULERA) 200-5 MCG/ACT inhaler 2 puff  2 puff Inhalation BID Ivor Costa, MD        ondansetron [facility]) injection 4 mg  4 mg Intravenous Q8H PRN Ivor Costa, MD       oxyCODONE-acetaminophen (PERCOCET/ROXICET) 5-325 MG per tablet 1 tablet  1 tablet Oral Q4H PRN Ivor Costa, MD   1 tablet at 08/01/22 0175   Facility-Administered Medications Ordered in Other Encounters  Medication Dose Route Frequency Provider Last Rate Last Admin   sodium chloride flush (NS) 0.9 % injection 10 mL  10 mL Intravenous PRN Sindy Guadeloupe, MD   10 mL at 03/09/20 0919    VITAL SIGNS: BP 119/73 (BP Location: Left Arm)   Pulse 76   Temp 98.6 F (37 C)   Resp 19   Ht 5\' 10"  (1.778 m)   Wt 145 lb (65.8 kg)   SpO2 97%   BMI 20.81 kg/m  Filed Weights   07/31/22 1132  Weight: 145 lb (65.8 kg)    Estimated body mass index is 20.81 kg/m as calculated from the following:   Height as of this encounter: 5\' 10"  (1.778 m).   Weight as of this encounter: 145 lb (65.8 kg).  LABS: CBC:    Component Value Date/Time   WBC 7.8 08/01/2022 0525   HGB 9.5 (L) 08/01/2022 0525   HGB 15.4 01/19/2013 1320   HCT 29.9 (L) 08/01/2022 0525   HCT 44.6 01/19/2013 1320   PLT 302 08/01/2022 0525   PLT 344 01/19/2013 1320   MCV 97.4 08/01/2022 0525   MCV 90 01/19/2013 1320   NEUTROABS 8.5 (H) 07/31/2022 1234   NEUTROABS 5.8 01/19/2013 1320   LYMPHSABS 0.6 (L) 07/31/2022 1234   LYMPHSABS 1.7 01/19/2013 1320   MONOABS 0.8 07/31/2022 1234   MONOABS 0.7 01/19/2013 1320   EOSABS 0.0 07/31/2022 1234   EOSABS 0.8 (H) 01/19/2013 1320   BASOSABS 0.0 07/31/2022 1234   BASOSABS 0.1 01/19/2013 1320   Comprehensive Metabolic Panel:    Component Value Date/Time   NA 137 08/01/2022 0525   NA 138 01/19/2013 1320   K 3.6 08/01/2022 0525   K 4.6 01/19/2013 1320   CL 104 08/01/2022 0525   CL 104 01/19/2013 1320   CO2 25 08/01/2022 0525   CO2 30 01/19/2013 1320   BUN 11 08/01/2022 0525   BUN 15 01/19/2013 1320   CREATININE 0.67 08/01/2022 0525   CREATININE 1.05 01/19/2013 1320   GLUCOSE 82 08/01/2022 0525    GLUCOSE 96 01/19/2013 1320   CALCIUM 8.5 (L) 08/01/2022 0525   CALCIUM 9.0 01/19/2013 1320   AST 51 (H) 07/31/2022 1234   AST 20 01/19/2013 1320   ALT 17 07/31/2022 1234   ALT 17 01/19/2013 1320   ALKPHOS 166 (H) 07/31/2022 1234   ALKPHOS 84 01/19/2013 1320   BILITOT 1.1 07/31/2022 1234   BILITOT 0.5 01/19/2013 1320   PROT 7.3 07/31/2022 1234   PROT 7.8 01/19/2013 1320  ALBUMIN 3.3 (L) 07/31/2022 1234   ALBUMIN 3.9 01/19/2013 1320    RADIOGRAPHIC STUDIES: CT Angio Chest PE W/Cm &/Or Wo Cm  Result Date: 07/31/2022 CLINICAL DATA:  High clinical suspicion for PE, possible seizures EXAM: CT ANGIOGRAPHY CHEST WITH CONTRAST TECHNIQUE: Multidetector CT imaging of the chest was performed using the standard protocol during bolus administration of intravenous contrast. Multiplanar CT image reconstructions and MIPs were obtained to evaluate the vascular anatomy. RADIATION DOSE REDUCTION: This exam was performed according to the departmental dose-optimization program which includes automated exposure control, adjustment of the mA and/or kV according to patient size and/or use of iterative reconstruction technique. CONTRAST:  61mL OMNIPAQUE IOHEXOL 350 MG/ML SOLN COMPARISON:  07/27/2022 FINDINGS: Cardiovascular: There are no intraluminal filling defects in pulmonary artery branches. There is homogeneous enhancement in thoracic aorta. There is ectasia of ascending thoracic aorta measuring 3.6 cm. Mediastinum/Nodes: There are enlarged lymph nodes in left hilum. There is soft tissue density structure in subcarinal region which may suggest prominent pericardial recess or enlarged lymph node. Lungs/Pleura: There are multiple noncalcified nodules of varying sizes in both lungs with no significant interval change. There is no focal pulmonary consolidation. There is no pleural effusion or pneumothorax. Upper Abdomen: There are multiple low-density space-occupying lesions of varying sizes in liver measuring up to 5.7  cm in size suggesting hepatic metastatic disease. Evaluation is limited in this study done during arterial phase. Subcentimeter nodular density in left adrenal has not changed. Musculoskeletal: No acute findings are seen. Review of the MIP images confirms the above findings. IMPRESSION: There is no evidence of pulmonary artery embolism. There is no evidence of thoracic aortic dissection. There are no focal pulmonary infiltrates. Extensive pulmonary and hepatic metastatic disease. Electronically Signed   By: Elmer Picker M.D.   On: 07/31/2022 13:21   CT Head Wo Contrast  Result Date: 07/31/2022 CLINICAL DATA:  Headache, increasing frequency or severity EXAM: CT HEAD WITHOUT CONTRAST TECHNIQUE: Contiguous axial images were obtained from the base of the skull through the vertex without intravenous contrast. RADIATION DOSE REDUCTION: This exam was performed according to the departmental dose-optimization program which includes automated exposure control, adjustment of the mA and/or kV according to patient size and/or use of iterative reconstruction technique. COMPARISON:  None Available. FINDINGS: Brain: No evidence of acute infarction, hemorrhage, hydrocephalus, extra-axial collection or mass lesion/mass effect. Vascular: No hyperdense vessel identified. Skull: No acute fracture. Sinuses/Orbits: Scattered paranasal sinus mucosal thickening. No acute orbital findings. Other: Partially imaged right nasopharyngeal tumor, better characterized on prior CT neck. IMPRESSION: 1. No evidence of acute intracranial abnormality. 2. Partially imaged right nasopharyngeal tumor, better characterized on prior CT neck. Electronically Signed   By: Margaretha Sheffield M.D.   On: 07/31/2022 13:11   CT SOFT TISSUE NECK W CONTRAST  Result Date: 07/27/2022 CLINICAL DATA:  History of stage IV squamous cell tonsillar carcinoma. A EXAM: CT NECK WITH CONTRAST TECHNIQUE: Multidetector CT imaging of the neck was performed using the  standard protocol following the bolus administration of intravenous contrast. RADIATION DOSE REDUCTION: This exam was performed according to the departmental dose-optimization program which includes automated exposure control, adjustment of the mA and/or kV according to patient size and/or use of iterative reconstruction technique. CONTRAST:  169mL OMNIPAQUE IOHEXOL 300 MG/ML  SOLN COMPARISON:  CT of the neck 04/19/2022. PET scan 01/19/2022. CT neck 11/22/2021. FINDINGS: Pharynx and larynx: Progressive enhancing tumor extends cephalo caudad from the nasopharynx the posterior oropharynx measuring 7.2 cm. For extensive heterogeneous enhancement the soft  palate is present. Tumor enhancement extends across midline in the posterior oropharynx measuring 4.5 cm in transverse dimension. Bulky tumor is present in the posterior left oropharynx measuring 1.9 cm anterior-posterior. Salivary glands: The submandibular and parotid glands and ducts are within normal limits. Thyroid: Normal Lymph nodes: Progressive bilateral malignant adenopathy is present. A right lateral retropharyngeal node demonstrates continued growth, now measuring 2.1 x 1.3 cm. Heterogeneous enlarged left level 2 lymph nodes have progressed. The largest node now measures 3.7 x 2.2 x 2.6 cm. Adjacent superior node or lobulation of this node measures an additional 21 mm. The largest right level 2 lymph node measures 2.8 x 1.8 x 1.8 cm. Based rounded left level 3 and level 4 lymph nodes have also increased in size. Peripherally enhancing right level 3 lymph nodes have increased in size, the largest now measuring 11 x 6 mm. Vascular: No significant vascular lesions are present. Limited intracranial: Within normal limits. Visualized orbits: The globes and orbits are within normal limits. Mastoids and visualized paranasal sinuses: Fluid is present in the right lateral recess scratched at fluid is present in the lateral recess the right sphenoid sinus. A posterior  left ethmoid air cells opacified. Scattered mucosal thickening is present throughout ethmoid air cells bilaterally. A polyp or mucous retention cyst is present in the right frontoethmoidal recess. Mild mucosal thickening is present in the inferior right maxillary sinus. Mastoid air cells are clear. Skeleton: Degenerative changes of the lower cervical spine are again noted with disease greatest at C6-7, left greater than right. 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 Upper chest: Multiple bilateral pulmonary nodules are present, better described on chest CT of the same day. IMPRESSION: 1. Progressive enhancing tumor extending from the nasopharynx to the posterior oropharynx, predominantly on the right. Tumor is progressed across midline in the oropharynx. 2. Progressive bilateral malignant adenopathy. 3. Multiple bilateral pulmonary nodules, better described on chest CT of the same day. Electronically Signed   By: San Morelle M.D.   On: 07/27/2022 16:18   CT CHEST ABDOMEN PELVIS W CONTRAST  Result Date: 07/27/2022 CLINICAL DATA:  History of stage IV squamous cell tonsillar cancer. Restaging/follow-up. * Tracking Code: BO * EXAM: CT CHEST, ABDOMEN, AND PELVIS WITH CONTRAST TECHNIQUE: Multidetector CT imaging of the chest, abdomen and pelvis was performed following the standard protocol during bolus administration of intravenous contrast. RADIATION DOSE REDUCTION: This exam was performed according to the departmental dose-optimization program which includes automated exposure control, adjustment of the mA and/or kV according to patient size and/or use of iterative reconstruction technique. CONTRAST:  110mL OMNIPAQUE IOHEXOL 300 MG/ML  SOLN COMPARISON:  Multiple priors including most recent CT April 19, 2022. FINDINGS: CT CHEST FINDINGS Cardiovascular: Right chest wall Port-A-Cath with tip at the superior cavoatrial junction. Aortic atherosclerosis. No central pulmonary embolus on this nondedicated study.  Normal size heart. No significant pericardial effusion/thickening. Mediastinum/Nodes: No suspicious thyroid nodule. New enlarged left hilar lymph node measures 12 mm on image 43/4. The esophagus is grossly unremarkable. Lungs/Pleura: Significant increase in size and number of the numerous bilateral pulmonary metastases. Indexed nodules are as follows: -right upper lobe pulmonary nodule measures 9 mm on image 80/6 previously 5 mm. -left upper lobe pulmonary nodule measures 10 mm on image 92/6 previously 5 mm. -right lower lobe pulmonary nodule measures 15 mm on image 131/6 previously 7 mm. No pleural effusion.  No pneumothorax. Musculoskeletal: Multilevel degenerative changes spine. No aggressive lytic or blastic lesion of bone. CT ABDOMEN PELVIS FINDINGS Hepatobiliary: Increase in  size and number of the bilobar hepatic metastatic lesions. Index lesions are as follows: -dominant lesion in the left right lobe of the liver now measures 8.7 x 7.8 cm on image 73/4 previously 4.1 x 4.1 cm. -lateral left lobe of the liver lesion measures 8.2 x 6.2 cm on image 67/4 previously 2.5 x 2.2 cm when remeasured for consistency. Gallbladder is distended without evidence of acute inflammation. No biliary ductal dilation. Pancreas: No pancreatic ductal dilation or evidence of acute inflammation. Spleen: No splenomegaly. Adrenals/Urinary Tract: Subtle 7 mm nodular focus in the left adrenal gland on image 71/4 is stable dating back to at least July 02, 2020 and was not abnormally hypermetabolic on prior PET-CT likely reflecting a tiny adenoma. Right adrenal gland appears normal. Stable benign right renal cyst. Nonobstructive bilateral renal stones. Urinary bladder is unremarkable for degree of distension. Stomach/Bowel: Stomach is unremarkable for degree of distension. No pathologic dilation of small or large bowel. Colonic diverticulosis without findings of acute diverticulitis. Vascular/Lymphatic: Normal caliber abdominal aorta.  No pathologically enlarged abdominal or pelvic lymph nodes. Reproductive: Dystrophic calcifications in an enlarged prostate gland. Other: No significant abdominal free fluid. Musculoskeletal: No aggressive lytic or blastic lesion of bone. Multilevel degenerative changes spine. Degenerative changes of the bilateral hips. IMPRESSION: 1. Progression of disease with increased burden of bilateral pulmonary metastases and bilobar hepatic metastatic lesions as well as a new enlarged left hilar lymph node. 2. No evidence of metastatic disease in the abdomen or pelvis. 3. Please refer to same day CT of the neck for findings above the clavicles. 4. Colonic diverticulosis without findings of acute diverticulitis. 5. Nonobstructive bilateral renal stones. 6. Subtle 7 mm nodular focus in the left adrenal gland is stable dating back to at least July 02, 2020 and was not abnormally hypermetabolic on prior PET-CT likely reflecting a tiny adenoma, attention on follow-up imaging suggested. 7.  Aortic Atherosclerosis (ICD10-I70.0). Electronically Signed   By: Dahlia Bailiff M.D.   On: 07/27/2022 13:24    PERFORMANCE STATUS (ECOG) : 2 - Symptomatic, <50% confined to bed  Review of Systems Unless otherwise noted, a complete review of systems is negative.  Physical Exam General: NAD Pulmonary: Unlabored Extremities: no edema, no joint deformities Skin: no rashes Neurological: Weakness but otherwise nonfocal  IMPRESSION: Patient appears more engaging today.  He says that he feels improved today.  Blood pressure seems to have corrected with IV fluids.  CT of the chest showed no evidence of PE or focal pulmonary infiltrates but extensive pulmonary hepatic metastatic disease.  CT of the head showed no evidence of acute intracranial abnormality.  Patient again confirms that he is not interested in pursuing further cancer treatment.  He is interested in discharging home with hospice when he is medically ready.  Will  consult TOC to help coordinate.  Symptomatically, he endorses neck pain, which radiates up to his face and scalp.  This is in the setting of known progressive tumor extending from the nasopharynx to posterior oropharynx and progressive bilateral malignant adenopathy.  Patient has received a dose of Percocet earlier this morning but does not feel like it helped much.  Patient describes pain as at times feeling like a shooting sensation up the face with hypersensitivity to his scalp.  Will rotate from Percocet to oxycodone IR and liberalize dosing.  Will also start him on gabapentin.  PLAN: -Referral to hospice -Increase dose of oxycodone as needed for pain -Start gabapentin 100 mg twice daily -DNR/DNI   Time Total:  30 minutes  Visit consisted of counseling and education dealing with the complex and emotionally intense issues of symptom management and palliative care in the setting of serious and potentially life-threatening illness.Greater than 50%  of this time was spent counseling and coordinating care related to the above assessment and plan.  Signed by: Altha Harm, PhD, NP-C

## 2022-08-01 NOTE — TOC Transition Note (Signed)
Transition of Care [facility]) - CM/SW Discharge Note   Patient Details  Name: Patrick Mendoza. MRN: [id number] Date of Birth: [birth year]  Transition of Care [facility] CM/SW Contact:  Colen Darling, [place] Phone Number: 08/01/2022, 2:21 PM   Clinical Narrative:     Patient will discharge home with [facility].  Final next level of care: [place]     Patient Goals and [place]    Discharge Placement             [place]          Discharge Plan and Services                            [facility] Agency: Other - See comment [facility])     Representative spoke with at [place]: [place] Determinants of Health (SDOH) Interventions     Readmission Risk Interventions     No data to display

## 2022-08-01 NOTE — Discharge Summary (Signed)
Physician Discharge Summary   Patient: Patrick Mendoza. MRN: [id number]  DOB: March 22, 1951   Admit:     Date of Admission: 07/31/2022 Admitted from: home   Discharge: Date of discharge: 08/01/22 Disposition:  home hospice  Condition at discharge: fair  CODE STATUS: DNR     Discharge Physician: Emeterio Reeve, DO [facility]     PCP: Sindy Guadeloupe, MD  Recommendations for Outpatient Follow-up:  Follow up with PCP Sindy Guadeloupe, MD in 2-4 weeks Please obtain labs/tests: as needed per outpatient team  Please follow up on the following pending results: none   Discharge Instructions     Diet general   Complete by: As directed    Increase activity slowly   Complete by: As directed          Discharge Diagnoses: Principal Problem:   Syncope Active Problems:   Hypotension   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 is a 71 y.o. 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 Pt is status post 6 cycles of CarboTaxol chemotherapy followed by maintenance Keytruda until February 2023. Patient subsequently opted not to pursue further systemic chemotherapy despite repeat imaging showing disease progression. Per her daughter, patient has generalized weakness, multiple falls recently.  Patient was seen by her oncologist Dr. Janese Mendoza in clinic today, and had syncope in office.  Patient was sent to ED for further evaluation and treatment.  12/04: hypotension initially, with blood pressure 68/52 which improved to 114/67 after giving 1 L LR in ED. WBC 10.0, troponin 6, GFR> 60, temperature normal, heart rate 79, RR 16, oxygen saturation 98% on room air.  CTA negative for PE.  CT of head is negative  for acute intracranial abnormalities. Admitted. Syncope d/t hypotension secondary to poor po intake. See IPAL note by admitting hospitalist. Patient clearly states that he does not want to do any aggressive systemic treatment. 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  He does not want to further image of brain.  He wants to have home hospice care. Palliative consult placed.  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   butalbital-acetaminophen-caffeine 50-325-40 MG tablet Commonly known  as: FIORICET Take 1 tablet by mouth every 6 (six) hours as needed for headache.   dextromethorphan-guaiFENesin 30-600 MG 12hr tablet Commonly known as: MUCINEX DM Take 1 tablet by mouth 2 (two) times daily as needed for cough.   feeding supplement Liqd Take 237 mLs by mouth 2 (two) times daily between meals. Start taking on: August 02, 2022   gabapentin 100 MG capsule Commonly known as: NEURONTIN Take 1 capsule (100 mg total) by mouth 2 (two) times daily.   ibuprofen 800 MG tablet Commonly known as: ADVIL Take  1 tablet (800 mg total) by mouth every 8 (eight) hours as needed.   oxyCODONE 5 MG immediate release tablet Commonly known as: Oxy IR/ROXICODONE Take 1-2 tablets (5-10 mg total) by mouth every 4 (four) hours as needed for severe pain.   Primatene Mist 0.125 MG/ACT Aero Generic drug: EPINEPHrine Inhale 1 puff into the lungs daily as needed (shortness of breath).   Symbicort 160-4.5 MCG/ACT inhaler Generic drug: budesonide-formoterol Inhale 2 puffs by mouth twice daily          No Known Allergies   Subjective: Pt reports headache but otherwise no complaints    Discharge Exam: BP 118/72 (BP Location: Left Arm)   Pulse 70   Temp 98.1 F (36.7 C)   Resp 18   Ht 5\' 10"  (1.778 m)   Wt 65.8 kg   SpO2 98%   BMI 20.81 kg/m  General: Pt is alert, awake, not in acute distress Cardiovascular: RRR, S1/S2, no rubs, no gallops Respiratory: CTA bilaterally, no wheezing, no rhonchi Abdominal: Soft, NT, ND, bowel sounds + Extremities: no edema, no cyanosis     The results of significant diagnostics from this hospitalization (including imaging, microbiology, ancillary and laboratory) are listed below for reference.     Microbiology: No results found for this or any previous visit (from the past 240 hour(s)).   Labs: BNP (last 3 results) No results for input(s): "BNP" in the last 8760 hours. Basic Metabolic Panel: Recent Labs  Lab 07/27/22 0814 07/31/22 1008 07/31/22 1234 08/01/22 0525  NA  --  136 137 137  K  --  3.6 4.3 3.6  CL  --  99 102 104  CO2  --  22 25 25   GLUCOSE  --  133* 94 82  BUN  --  18 20 11   CREATININE 0.90 0.86 0.86 0.67  CALCIUM  --  9.1 9.1 8.5*   Liver Function Tests: Recent Labs  Lab 07/31/22 1008 07/31/22 1234  AST 57* 51*  ALT 20 17  ALKPHOS 162* 166*  BILITOT 1.0 1.1  PROT 7.5 7.3  ALBUMIN 3.6 3.3*   No results for input(s): "LIPASE", "AMYLASE" in the last 168 hours. No results for input(s): "AMMONIA" in the last 168  hours. CBC: Recent Labs  Lab 07/31/22 1008 07/31/22 1234 08/01/22 0525  WBC 8.2 10.0 7.8  NEUTROABS 6.6 8.5*  --   HGB 11.7* 10.9* 9.5*  HCT 35.8* 34.0* 29.9*  MCV 96.2 98.0 97.4  PLT 391 369 302   Cardiac Enzymes: No results for input(s): "CKTOTAL", "CKMB", "CKMBINDEX", "TROPONINI" in the last 168 hours. BNP: Invalid input(s): "POCBNP" CBG: No results for input(s): "GLUCAP" in the last 168 hours. D-Dimer No results for input(s): "DDIMER" in the last 72 hours. Hgb A1c No results for input(s): "HGBA1C" in the last 72 hours. Lipid Profile No results for input(s): "CHOL", "HDL", "LDLCALC", "TRIG", "CHOLHDL", "LDLDIRECT" in the last 72 hours. Thyroid function studies No results for  input(s): "TSH", "T4TOTAL", "T3FREE", "THYROIDAB" in the last 72 hours.  Invalid input(s): "FREET3" Anemia work up No results for input(s): "VITAMINB12", "FOLATE", "FERRITIN", "TIBC", "IRON", "RETICCTPCT" in the last 72 hours. Urinalysis    Component Value Date/Time   COLORURINE Yellow 01/19/2013 1321   APPEARANCEUR Clear 01/19/2013 1321   LABSPEC 1.020 01/19/2013 1321   PHURINE 5.0 01/19/2013 1321   GLUCOSEU Negative 01/19/2013 1321   HGBUR Negative 01/19/2013 1321   BILIRUBINUR Negative 01/19/2013 1321   KETONESUR Negative 01/19/2013 1321   PROTEINUR Negative 01/19/2013 1321   NITRITE Negative 01/19/2013 1321   LEUKOCYTESUR 3+ 01/19/2013 1321   Sepsis Labs Recent Labs  Lab 07/31/22 1008 07/31/22 1234 08/01/22 0525  WBC 8.2 10.0 7.8   Microbiology No results found for this or any previous visit (from the past 240 hour(s)). Imaging CT Angio Chest PE W/Cm &/Or Wo Cm  Result Date: 07/31/2022 CLINICAL DATA:  High clinical suspicion for PE, possible seizures EXAM: CT ANGIOGRAPHY CHEST WITH CONTRAST TECHNIQUE: Multidetector CT imaging of the chest was performed using the standard protocol during bolus administration of intravenous contrast. Multiplanar CT image reconstructions and MIPs  were obtained to evaluate the vascular anatomy. RADIATION DOSE REDUCTION: This exam was performed according to the departmental dose-optimization program which includes automated exposure control, adjustment of the mA and/or kV according to patient size and/or use of iterative reconstruction technique. CONTRAST:  42mL OMNIPAQUE IOHEXOL 350 MG/ML SOLN COMPARISON:  07/27/2022 FINDINGS: Cardiovascular: There are no intraluminal filling defects in pulmonary artery branches. There is homogeneous enhancement in thoracic aorta. There is ectasia of ascending thoracic aorta measuring 3.6 cm. Mediastinum/Nodes: There are enlarged lymph nodes in left hilum. There is soft tissue density structure in subcarinal region which may suggest prominent pericardial recess or enlarged lymph node. Lungs/Pleura: There are multiple noncalcified nodules of varying sizes in both lungs with no significant interval change. There is no focal pulmonary consolidation. There is no pleural effusion or pneumothorax. Upper Abdomen: There are multiple low-density space-occupying lesions of varying sizes in liver measuring up to 5.7 cm in size suggesting hepatic metastatic disease. Evaluation is limited in this study done during arterial phase. Subcentimeter nodular density in left adrenal has not changed. Musculoskeletal: No acute findings are seen. Review of the MIP images confirms the above findings. IMPRESSION: There is no evidence of pulmonary artery embolism. There is no evidence of thoracic aortic dissection. There are no focal pulmonary infiltrates. Extensive pulmonary and hepatic metastatic disease. Electronically Signed   By: Elmer Picker M.D.   On: 07/31/2022 13:21   CT Head Wo Contrast  Result Date: 07/31/2022 CLINICAL DATA:  Headache, increasing frequency or severity EXAM: CT HEAD WITHOUT CONTRAST TECHNIQUE: Contiguous axial images were obtained from the base of the skull through the vertex without intravenous contrast. RADIATION  DOSE REDUCTION: This exam was performed according to the departmental dose-optimization program which includes automated exposure control, adjustment of the mA and/or kV according to patient size and/or use of iterative reconstruction technique. COMPARISON:  None Available. FINDINGS: Brain: No evidence of acute infarction, hemorrhage, hydrocephalus, extra-axial collection or mass lesion/mass effect. Vascular: No hyperdense vessel identified. Skull: No acute fracture. Sinuses/Orbits: Scattered paranasal sinus mucosal thickening. No acute orbital findings. Other: Partially imaged right nasopharyngeal tumor, better characterized on prior CT neck. IMPRESSION: 1. No evidence of acute intracranial abnormality. 2. Partially imaged right nasopharyngeal tumor, better characterized on prior CT neck. Electronically Signed   By: Margaretha Sheffield M.D.   On: 07/31/2022 13:11  Time coordinating discharge: over 30 minutes  SIGNED:  Emeterio Reeve DO [facility]

## 2022-08-01 NOTE — Hospital Course (Signed)
Patrick Mendoza. is a 71 y.o. 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 Pt is status post 6 cycles of CarboTaxol chemotherapy followed by maintenance Keytruda until February 2023. Patient subsequently opted not to pursue further systemic chemotherapy despite repeat imaging showing disease progression. Per her daughter, patient has generalized weakness, multiple falls recently.  Patient was seen by her oncologist Dr. Janese Banks in clinic today, and had syncope in office.  Patient was sent to ED for further evaluation and treatment.  12/04: hypotension initially, with blood pressure 68/52 which improved to 114/67 after giving 1 L LR in ED. WBC 10.0, troponin 6, GFR> 60, temperature normal, heart rate 79, RR 16, oxygen saturation 98% on room air.  CTA negative for PE.  CT of head is negative for acute intracranial abnormalities. Admitted. Syncope d/t hypotension secondary to poor po intake. See IPAL note by admitting hospitalist. Patient clearly states that he does not want to do any aggressive systemic treatment. 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  He does not want to further image of brain.  He wants to have home hospice care. Palliative consult placed.  12/05: ***  Consultants:  Palliative medicine   Procedures: None       ASSESSMENT & PLAN:   Principal Problem:   Syncope Active Problems:   Hypotension   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)   Asthma   Fall at home, initial encounter   Severe protein-calorie malnutrition ([place])   No problem-specific Assessment & Plan notes found for this encounter.     DVT prophylaxis: *** Pertinent IV fluids/nutrition: *** Central lines / invasive devices: ***  Code Status: *** Family Communication: ***  Disposition: *** TOC needs: *** Barriers to discharge /  significant pending items: ***

## 2022-08-07 ENCOUNTER — Telehealth: Payer: Self-pay | Admitting: Family Medicine

## 2022-08-07 NOTE — Telephone Encounter (Signed)
Pt has had Dilaudid pump with a 0.4 mg/hr continuous dose and a 0.2 mg Q 15 min prn bolus dose for pain. Received call from Drucilla Chalet, Hospice nurse that pt reports his pain is a 7 on a 10 scale.  Legrand Como went to check on the pump and pt is receiving the continuous dose and in 2/5 hrs has gotten 1.2 mg but he states the pump is not administering the bolus dose. Pt has Roxanol 100 mg/5 ml in the home. Nurse provided education that pt can give a prn dose with a different button on the machine which overrides the handheld button and that he should only give a dose q 15 minutes if needed and ideally getting him to a 5 or less would be improved.  Pt concerned that he would be unable to sleep.  Wyona Almas for tonight to give 0.25 ml of Roxanol Q 3 hours  prn for breakthrough pain until he wakes in the am then stop and use the button that Legrand Como showed him to see if his current dose will get him more comfortable or needs to be titrated.  Legrand Como had already called the infusion pump company to trouble shoot and was unable to fix the problem.  Will have Hospice provider follow up tomorrow around midday to see if the Dilaudid by itself was keeping him comfortable.  Damaris Hippo FNP-C

## 2022-08-28 DEATH — deceased
